# Patient Record
Sex: Male | Born: 1935 | Race: White | Hispanic: No | Marital: Married | Smoking: Former smoker
Health system: Southern US, Community
[De-identification: ages and names within clinical notes are randomized; demographics above are authoritative.]

## PROBLEM LIST (undated history)

## (undated) DIAGNOSIS — Z950 Presence of cardiac pacemaker: Secondary | ICD-10-CM

## (undated) DIAGNOSIS — M7989 Other specified soft tissue disorders: Secondary | ICD-10-CM

## (undated) DIAGNOSIS — I5020 Unspecified systolic (congestive) heart failure: Secondary | ICD-10-CM

## (undated) DIAGNOSIS — I714 Abdominal aortic aneurysm, without rupture, unspecified: Secondary | ICD-10-CM

## (undated) DIAGNOSIS — I2510 Atherosclerotic heart disease of native coronary artery without angina pectoris: Secondary | ICD-10-CM

## (undated) DIAGNOSIS — R451 Restlessness and agitation: Secondary | ICD-10-CM

## (undated) DIAGNOSIS — I219 Acute myocardial infarction, unspecified: Secondary | ICD-10-CM

## (undated) DIAGNOSIS — R0602 Shortness of breath: Secondary | ICD-10-CM

## (undated) DIAGNOSIS — M2550 Pain in unspecified joint: Secondary | ICD-10-CM

## (undated) DIAGNOSIS — Z972 Presence of dental prosthetic device (complete) (partial): Secondary | ICD-10-CM

## (undated) DIAGNOSIS — E875 Hyperkalemia: Secondary | ICD-10-CM

## (undated) DIAGNOSIS — I519 Heart disease, unspecified: Secondary | ICD-10-CM

## (undated) DIAGNOSIS — H532 Diplopia: Secondary | ICD-10-CM

## (undated) DIAGNOSIS — I10 Essential (primary) hypertension: Secondary | ICD-10-CM

## (undated) DIAGNOSIS — N289 Disorder of kidney and ureter, unspecified: Secondary | ICD-10-CM

## (undated) DIAGNOSIS — R609 Edema, unspecified: Secondary | ICD-10-CM

## (undated) DIAGNOSIS — I4510 Unspecified right bundle-branch block: Secondary | ICD-10-CM

## (undated) DIAGNOSIS — E785 Hyperlipidemia, unspecified: Secondary | ICD-10-CM

## (undated) DIAGNOSIS — R410 Disorientation, unspecified: Secondary | ICD-10-CM

## (undated) DIAGNOSIS — I639 Cerebral infarction, unspecified: Secondary | ICD-10-CM

## (undated) DIAGNOSIS — R413 Other amnesia: Secondary | ICD-10-CM

## (undated) HISTORY — DX: Abdominal aortic aneurysm, without rupture, unspecified: I71.40

## (undated) HISTORY — DX: Hyperlipidemia, unspecified: E78.5

## (undated) HISTORY — DX: Edema, unspecified: R60.9

## (undated) HISTORY — DX: Heart disease, unspecified: I51.9

## (undated) HISTORY — PX: ANEURYSM COILING: SHX5349

## (undated) HISTORY — DX: Restlessness and agitation: R45.1

## (undated) HISTORY — PX: ROTATOR CUFF REPAIR: SHX139

## (undated) HISTORY — DX: Cerebral infarction, unspecified: I63.9

## (undated) HISTORY — DX: Disorientation, unspecified: R41.0

## (undated) HISTORY — DX: Presence of cardiac pacemaker: Z95.0

## (undated) HISTORY — DX: Essential (primary) hypertension: I10

## (undated) HISTORY — PX: HERNIA REPAIR: SHX51

## (undated) HISTORY — DX: Other amnesia: R41.3

## (undated) HISTORY — DX: Diplopia: H53.2

## (undated) HISTORY — DX: Pain in unspecified joint: M25.50

## (undated) HISTORY — DX: Other specified soft tissue disorders: M79.89

## (undated) HISTORY — DX: Presence of dental prosthetic device (complete) (partial): Z97.2

## (undated) HISTORY — DX: Disorder of kidney and ureter, unspecified: N28.9

## (undated) HISTORY — DX: Unspecified right bundle-branch block: I45.10

## (undated) HISTORY — DX: Acute myocardial infarction, unspecified: I21.9

## (undated) HISTORY — DX: Atherosclerotic heart disease of native coronary artery without angina pectoris: I25.10

## (undated) HISTORY — DX: Shortness of breath: R06.02

## (undated) HISTORY — DX: Hyperkalemia: E87.5

## (undated) HISTORY — PX: LASIK: SHX215

## (undated) HISTORY — DX: Abdominal aortic aneurysm, without rupture: I71.4

---

## 1988-11-27 HISTORY — PX: CORONARY ARTERY BYPASS GRAFT: SHX141

## 2009-11-27 HISTORY — PX: OTHER SURGICAL HISTORY: SHX169

## 2010-02-22 ENCOUNTER — Encounter: Payer: Self-pay | Admitting: Internal Medicine

## 2010-03-03 ENCOUNTER — Encounter: Payer: Self-pay | Admitting: Internal Medicine

## 2010-03-08 ENCOUNTER — Encounter: Payer: Self-pay | Admitting: Internal Medicine

## 2010-03-10 ENCOUNTER — Encounter: Payer: Self-pay | Admitting: Internal Medicine

## 2010-03-24 ENCOUNTER — Ambulatory Visit: Payer: Self-pay | Admitting: Internal Medicine

## 2010-03-24 DIAGNOSIS — I219 Acute myocardial infarction, unspecified: Secondary | ICD-10-CM | POA: Insufficient documentation

## 2010-03-24 DIAGNOSIS — I714 Abdominal aortic aneurysm, without rupture, unspecified: Secondary | ICD-10-CM | POA: Insufficient documentation

## 2010-03-24 DIAGNOSIS — I5022 Chronic systolic (congestive) heart failure: Secondary | ICD-10-CM

## 2010-03-24 DIAGNOSIS — I472 Ventricular tachycardia: Secondary | ICD-10-CM

## 2010-03-24 DIAGNOSIS — I2510 Atherosclerotic heart disease of native coronary artery without angina pectoris: Secondary | ICD-10-CM | POA: Insufficient documentation

## 2010-03-24 DIAGNOSIS — I4510 Unspecified right bundle-branch block: Secondary | ICD-10-CM

## 2010-03-24 DIAGNOSIS — E119 Type 2 diabetes mellitus without complications: Secondary | ICD-10-CM | POA: Insufficient documentation

## 2010-04-05 ENCOUNTER — Inpatient Hospital Stay (HOSPITAL_COMMUNITY): Admission: RE | Admit: 2010-04-05 | Discharge: 2010-04-06 | Payer: Self-pay | Admitting: Internal Medicine

## 2010-04-05 ENCOUNTER — Ambulatory Visit: Payer: Self-pay | Admitting: Internal Medicine

## 2010-04-06 ENCOUNTER — Encounter: Payer: Self-pay | Admitting: Internal Medicine

## 2010-04-12 ENCOUNTER — Encounter: Payer: Self-pay | Admitting: Internal Medicine

## 2010-04-18 ENCOUNTER — Encounter: Payer: Self-pay | Admitting: Internal Medicine

## 2010-04-18 ENCOUNTER — Ambulatory Visit: Payer: Self-pay

## 2010-07-19 ENCOUNTER — Ambulatory Visit: Payer: Self-pay | Admitting: Internal Medicine

## 2010-07-19 DIAGNOSIS — Z95810 Presence of automatic (implantable) cardiac defibrillator: Secondary | ICD-10-CM

## 2010-09-23 ENCOUNTER — Ambulatory Visit: Payer: Self-pay | Admitting: Cardiology

## 2010-09-23 ENCOUNTER — Encounter: Payer: Self-pay | Admitting: Internal Medicine

## 2010-12-22 ENCOUNTER — Ambulatory Visit
Admission: RE | Admit: 2010-12-22 | Discharge: 2010-12-22 | Payer: Self-pay | Source: Home / Self Care | Attending: Internal Medicine | Admitting: Internal Medicine

## 2010-12-22 ENCOUNTER — Encounter: Payer: Self-pay | Admitting: Internal Medicine

## 2010-12-27 NOTE — Cardiovascular Report (Signed)
Summary: Pre Op Orders  Pre Op Orders   Imported By: Roderic Ovens 04/19/2010 15:07:38  _____________________________________________________________________  External Attachment:    Type:   Image     Comment:   External Document

## 2010-12-27 NOTE — Letter (Signed)
Summary: Implantable Device Instructions  Architectural technologist, Main Office  [address]   [place], [place] [address]   Phone: [phone]  Fax: [phone]      Implantable Device Instructions  You are scheduled for:  _____ Implantable Cardioverter Defibrillator   on 04/05/10 with Dr. Ladona Ridgel.  1.  Please arrive at the Short Stay Center at [facility] at 5:30am on the day of your procedure.  2.  Do not eat or drink after midnight  the night before your procedure.  3.  Complete lab work on 03/29/10 at Dr Rondel Baton  do not have to office Yoube fasting.  4.  Do NOT take these medications for the am of your procedure: 1/2 your Lantus dose and HOLD the Metformin  5.  Plan for an overnight stay.  Bring your insurance cards and a list of your medications.  6.  Wash your chest and neck with antibacterial soap (any brand) the evening before and the morning of your procedure.  Rinse well.  7.  Education material received:           ICD _____            *If you have ANY questions after you get home, please call the office [phone]. Jason Walker  *Every attempt is made to prevent procedures from being rescheduled.  Due to the nauture of Electrophysiology, rescheduling can happen.  The physician is always aware and directs the staff when this occurs.

## 2010-12-27 NOTE — Miscellaneous (Signed)
Summary: Device preload  Clinical Lists Changes  Observations: Added new observation of ICD INDICATN: CM (04/12/2010 14:58) Added new observation of ICDLEADSTAT2: active (04/12/2010 14:58) Added new observation of ICDLEADSER2: [id number] (04/12/2010 14:58) Added new observation of ICDLEADMOD2: 5366  (04/12/2010 14:58) Added new observation of ICDLEADLOC2: RV  (04/12/2010 14:58) Added new observation of ICDLEADSTAT1: active  (04/12/2010 14:58) Added new observation of ICDLEADSER1: [id number] a  (04/12/2010 14:58) Added new observation of ICDLEADMOD1: 5076  (04/12/2010 14:58) Added new observation of ICDLEADLOC1: RA  (04/12/2010 14:58) Added new observation of ICD IMP MD: Lewayne Bunting, MD  (04/12/2010 14:58) Added new observation of ICDLEADDOI2: 04/05/2010  (04/12/2010 14:58) Added new observation of ICDLEADDOI1: 04/05/2010  (04/12/2010 14:58) Added new observation of ICD IMPL DTE: 04/05/2010  (04/12/2010 14:58) Added new observation of ICD SERL#: [id number] H  (04/12/2010 14:58) Added new observation of ICD MODL#: D224DRG  (04/12/2010 14:58) Added new observation of ICDMANUFACTR: Medtronic  (04/12/2010 14:58) Added new observation of ICD MD: Lewayne Bunting, MD  (04/12/2010 14:58)       ICD Specifications Following MD:  Lewayne Bunting, MD     ICD Vendor:  [employer]tronic     ICD Model Number:  D224DRG     ICD Serial Number:  [id number] ICD DOI:  04/05/2010     ICD Implanting MD:  Lewayne Bunting, MD  Lead 1:    Location: RA     DOI: 04/05/2010     Model #: 0630     Serial #: [id number]     Status: active Lead 2:    Location: RV     DOI: 04/05/2010     Model #: 5732     Serial #: [id number]     Status: active  Indications::  CM

## 2010-12-27 NOTE — Procedures (Signed)
Summary: icd wound check   Current Medications (verified): 1)  Metoprolol Tartrate 100 Mg Tabs (Metoprolol Tartrate) .... Take One-Half  Tablet By Mouth Twice A Day 2)  Lantus 100 Unit/ml Soln (Insulin Glargine) .... Uad 3)  Aspirin Ec 325 Mg Tbec (Aspirin) .... Take One Tablet By Mouth Daily 4)  Metformin Hcl 1000 Mg Tabs (Metformin Hcl) .... Take One Tablet By Mouth Twice Daily. 5)  Lisinopril 40 Mg Tabs (Lisinopril) .... Take One Tablet By Mouth Daily 6)  Simvastatin 40 Mg Tabs (Simvastatin) .... Take One Tablet By Mouth Daily At Bedtime 7)  Nitrostat 0.4 Mg Subl (Nitroglycerin) .Marland Kitchen.. 1 Tablet Under Tongue At Onset of Chest Pain; You May Repeat Every 5 Minutes For Up To 3 Doses. 8)  Amlodipine Besylate 5 Mg Tabs (Amlodipine Besylate) .... Take One Tablet By Mouth Daily 9)  Benazepril Hcl 20 Mg Tabs (Benazepril Hcl) .... Take One Tablet By Mouth Once Daily. 10)  Doxazosin Mesylate 4 Mg Tabs (Doxazosin Mesylate) .... Take One Tablet By Mouth Once Daily. 11)  Apap 325 Mg Tabs (Acetaminophen) .... Uad 12)  Hydrocodone-Acetaminophen 5-325 Mg Tabs (Hydrocodone-Acetaminophen) .... Uad  Allergies (verified): No Known Drug Allergies   ICD Specifications Following MD:  Lewayne Bunting, MD     ICD Vendor:  Medtronic     ICD Model Number:  D224DRG     ICD Serial Number:  [id number] ICD DOI:  04/05/2010     ICD Implanting MD:  Lewayne Bunting, MD  Lead 1:    Location: RA     DOI: 04/05/2010     Model #: 0981     Serial #: [id number]     Status: active Lead 2:    Location: RV     DOI: 04/05/2010     Model #: 2130     Serial #: [id number]     Status: active  Indications::  CM   ICD Follow Up Remote Check?  No Battery Voltage:  3.21 V     Charge Time:  8.4 seconds     Underlying rhythm:  Huston Foley ICD Dependent:  No       ICD Device Measurements Atrium:  Amplitude: 3.5 mV, Impedance: 532 ohms, Threshold: 0.5 V at 0.4 msec Right Ventricle:  Amplitude: 3.5 mV, Impedance: 494 ohms, Threshold: 0.625 V at  0.4 msec Shock Impedance: 41/53 ohms   Episodes MS Episodes:  2     Percent Mode Switch:  <0.1%     Coumadin:  No Shock:  0     ATP:  0     Nonsustained:  0     Atrial Pacing:  83.8%     Ventricular Pacing:  0.2%  Brady Parameters Mode MVP (R)     Lower Rate Limit:  60     Upper Rate Limit 120 PAV 180     Sensed AV Delay:  150  Tachy Zones VF:  222     VT:  176     Next Remote Date:  12/22/2010     Next Cardiology Appt Due:  03/28/2011 Tech Comments:  Mr. Mcleish was seen today in [facility] at his request for concerns of a small lump above the incision site.  He was actually feeling the lead into the header of the device. R-wave chronic @ 3.63mv.   There were 2  mode switch episodes noted the longest 44 minutes, - coumadin.  Optivol and thoracic impedance normal.  Carelink transmissions every 3 months. ROV 5/12 with Dr. Ladona Ridgel in [facility].  Altha Harm, LPN  September 23, 2010 2:39 PM

## 2010-12-27 NOTE — Cardiovascular Report (Signed)
Summary: Office Visit   Office Visit   Imported By: Roderic Ovens 09/30/2010 09:50:51  _____________________________________________________________________  External Attachment:    Type:   Image     Comment:   External Document

## 2010-12-27 NOTE — Cardiovascular Report (Signed)
Summary: Office Visit   Office Visit   Imported By: Roderic Ovens 07/22/2010 10:22:00  _____________________________________________________________________  External Attachment:    Type:   Image     Comment:   External Document

## 2010-12-27 NOTE — Letter (Signed)
Summary: [facility]   [facility] Note   Imported By: Roderic Ovens 04/19/2010 15:09:49  _____________________________________________________________________  External Attachment:    Type:   Image     Comment:   External Document

## 2010-12-27 NOTE — Letter (Signed)
Summary: [facility] Note   [facility] Note   Imported By: Roderic Ovens 04/19/2010 15:09:15  _____________________________________________________________________  External Attachment:    Type:   Image     Comment:   External Document

## 2010-12-27 NOTE — Assessment & Plan Note (Signed)
Summary: NEP/VTACHY/BRADYCARDIA W/ BETABLOCKER/JML   Visit Type:  Initial Consult   History of Present Illness: Jason Walker is referred today by Dr. Hyacinth Meeker for consideration for ICD implant.  He has a long standing h/o CAD and is s/p MI and CABG.  He also has a h/o AAA.  The patient has never had syncope but he does have documented bradycardia associated with frequent PVC's in a Bigeminal fashion.  A recent 24 hour monitor demonstrated about a third of his heart beats were PVC's.  Also, he had NSVT on cardiac monitor.  He notes that some days he feels fatigued and tired and has difficulty with activities of daily living.  At other times he feels well.  He denies peripheral edema.  A stress myoview demonstrated a fixed inferior defect and an EF of 34% with no ischemia.                           Current Medications (verified): 1)  Metoprolol Tartrate 100 Mg Tabs (Metoprolol Tartrate) .... Take One-Half  Tablet By Mouth Twice A Day 2)  Lantus 100 Unit/ml Soln (Insulin Glargine) .... Uad 3)  Aspirin Ec 325 Mg Tbec (Aspirin) .... Take One Tablet By Mouth Daily 4)  Metformin Hcl 1000 Mg Tabs (Metformin Hcl) .... Take One Tablet By Mouth Twice Daily. 5)  Lisinopril 40 Mg Tabs (Lisinopril) .... Take One Tablet By Mouth Daily 6)  Simvastatin 40 Mg Tabs (Simvastatin) .... Take One Tablet By Mouth Daily At Bedtime 7)  Nitrostat 0.4 Mg Subl (Nitroglycerin) .Marland Kitchen.. 1 Tablet Under Tongue At Onset of Chest Pain; You May Repeat Every 5 Minutes For Up To 3 Doses. 8)  Amlodipine Besylate 5 Mg Tabs (Amlodipine Besylate) .... Take One Tablet By Mouth Daily 9)  Benazepril Hcl 20 Mg Tabs (Benazepril Hcl) .... Take One Tablet By Mouth Once Daily. 10)  Doxazosin Mesylate 4 Mg Tabs (Doxazosin Mesylate) .... Take One Tablet By Mouth Once Daily.  Allergies (verified): No Known Drug Allergies  Past History:  Past Medical History: Last updated: 03/23/2010 Current Problems:  RBBB (ICD-426.4) AAA (ICD-441.4) AMI  (ICD-410.90) DM (ICD-250.00) CAD (ICD-414.00)    Past Surgical History: CABG 1990  Review of Systems       All systems reviewed and negative excpt as noted in the HPI.  Vital Signs:  Patient profile:   77 year old male Height:      67 inches Weight:      194 pounds BMI:     30.49 Pulse rate:   57 / minute BP sitting:   174 / 74  (left arm)  Vitals Entered By: Laurance Flatten CMA (March 24, 2010 10:52 AM)  Physical Exam  General:  Well developed, well nourished, in no acute distress.  HEENT: normal Neck: supple. No JVD. Carotids 2+ bilaterally no bruits Cor: RRR no rubs, gallops or murmur. Well healed median sternotomy. Lungs: CTA Ab: soft, nontender. nondistended. No HSM. Good bowel sounds Ext: warm. no cyanosis, clubbing or edema Neuro: alert and oriented. Grossly nonfocal. affect pleasant    EKG  Procedure date:  03/24/2010  Findings:      Normal sinus rhythm with rate of:  60.  Comments:      Evidence of remote inferior MI.    Impression & Recommendations:  Problem # 1:  CAD (ICD-414.00) He currently is free of angina.  I have recommended that he continue his current meds. Her updated medication list for this problem  includes:    Metoprolol Tartrate 100 Mg Tabs (Metoprolol tartrate) .Marland Kitchen... Take one-half  tablet by mouth twice a day    Aspirin Ec 325 Mg Tbec (Aspirin) .Marland Kitchen... Take one tablet by mouth daily    Lisinopril 40 Mg Tabs (Lisinopril) .Marland Kitchen... Take one tablet by mouth daily    Nitrostat 0.4 Mg Subl (Nitroglycerin) .Marland Kitchen... 1 tablet under tongue at onset of chest pain; you may repeat every 5 minutes for up to 3 doses.    Amlodipine Besylate 5 Mg Tabs (Amlodipine besylate) .Marland Kitchen... Take one tablet by mouth daily    Benazepril Hcl 20 Mg Tabs (Benazepril hcl) .Marland Kitchen... Take one tablet by mouth once daily.  Problem # 2:  VENTRICULAR TACHYCARDIA, PAROXYSMAL (ICD-427.1) His VT is minimally symptomatic.  Continue current meds.  I will hold of on amiodarone for now but  would recommend ICD implant. Her updated medication list for this problem includes:    Metoprolol Tartrate 100 Mg Tabs (Metoprolol tartrate) .Marland Kitchen... Take one-half  tablet by mouth twice a day    Aspirin Ec 325 Mg Tbec (Aspirin) .Marland Kitchen... Take one tablet by mouth daily    Lisinopril 40 Mg Tabs (Lisinopril) .Marland Kitchen... Take one tablet by mouth daily    Nitrostat 0.4 Mg Subl (Nitroglycerin) .Marland Kitchen... 1 tablet under tongue at onset of chest pain; you may repeat every 5 minutes for up to 3 doses.    Amlodipine Besylate 5 Mg Tabs (Amlodipine besylate) .Marland Kitchen... Take one tablet by mouth daily    Benazepril Hcl 20 Mg Tabs (Benazepril hcl) .Marland Kitchen... Take one tablet by mouth once daily.  Problem # 3:  CHRONIC SYSTOLIC HEART FAILURE (ICD-428.22) He is currently class 2.  I have discussed the risks/benefits/goals/expectations of ICD implant with the patient and he wishes to proceed. Her updated medication list for this problem includes:    Metoprolol Tartrate 100 Mg Tabs (Metoprolol tartrate) .Marland Kitchen... Take one-half  tablet by mouth twice a day    Aspirin Ec 325 Mg Tbec (Aspirin) .Marland Kitchen... Take one tablet by mouth daily    Lisinopril 40 Mg Tabs (Lisinopril) .Marland Kitchen... Take one tablet by mouth daily    Nitrostat 0.4 Mg Subl (Nitroglycerin) .Marland Kitchen... 1 tablet under tongue at onset of chest pain; you may repeat every 5 minutes for up to 3 doses.    Amlodipine Besylate 5 Mg Tabs (Amlodipine besylate) .Marland Kitchen... Take one tablet by mouth daily    Benazepril Hcl 20 Mg Tabs (Benazepril hcl) .Marland Kitchen... Take one tablet by mouth once daily.  Patient Instructions: 1)  Your physician has recommended that you have a defibrillator inserted.  An implantable cardioverter defibrillator (ICD) is a small device that is placed in your chest or, in rare cases, your abdomen. This device uses electrical pulses or shocks to help control life-threatening, irregular heartbeats that could lead the heart to suddenly stop beating (sudden cardiac arrest). Leads are attached to the ICD  that goes into your heart. This is done in the hospital and usually requires an overnight stay. Please see the instruction sheet given to you today for more information.

## 2010-12-27 NOTE — Cardiovascular Report (Signed)
Summary: Office Visit   Office Visit   Imported By: Roderic Ovens 05/13/2010 11:43:06  _____________________________________________________________________  External Attachment:    Type:   Image     Comment:   External Document

## 2010-12-27 NOTE — Cardiovascular Report (Signed)
Summary: Device Implant Record   Device Implant Record   Imported By: Roderic Ovens 05/13/2010 11:45:47  _____________________________________________________________________  External Attachment:    Type:   Image     Comment:   External Document

## 2010-12-27 NOTE — Procedures (Signed)
Summary: wch. gd   Current Medications (verified): 1)  Metoprolol Tartrate 100 Mg Tabs (Metoprolol Tartrate) .... Take One-Half  Tablet By Mouth Twice A Day 2)  Lantus 100 Unit/ml Soln (Insulin Glargine) .... Uad 3)  Aspirin Ec 325 Mg Tbec (Aspirin) .... Take One Tablet By Mouth Daily 4)  Metformin Hcl 1000 Mg Tabs (Metformin Hcl) .... Take One Tablet By Mouth Twice Daily. 5)  Lisinopril 40 Mg Tabs (Lisinopril) .... Take One Tablet By Mouth Daily 6)  Simvastatin 40 Mg Tabs (Simvastatin) .... Take One Tablet By Mouth Daily At Bedtime 7)  Nitrostat 0.4 Mg Subl (Nitroglycerin) .Marland Kitchen.. 1 Tablet Under Tongue At Onset of Chest Pain; You May Repeat Every 5 Minutes For Up To 3 Doses. 8)  Amlodipine Besylate 5 Mg Tabs (Amlodipine Besylate) .... Take One Tablet By Mouth Daily 9)  Benazepril Hcl 20 Mg Tabs (Benazepril Hcl) .... Take One Tablet By Mouth Once Daily. 10)  Doxazosin Mesylate 4 Mg Tabs (Doxazosin Mesylate) .... Take One Tablet By Mouth Once Daily.  Allergies (verified): No Known Drug Allergies   ICD Specifications Following MD:  Lewayne Bunting, MD     ICD Vendor:  [employer]tronic     ICD Model Number:  D224DRG     ICD Serial Number:  [id number] ICD DOI:  04/05/2010     ICD Implanting MD:  Lewayne Bunting, MD  Lead 1:    Location: RA     DOI: 04/05/2010     Model #: 7425     Serial #: [id number]     Status: active Lead 2:    Location: RV     DOI: 04/05/2010     Model #: 3295     Serial #: [id number]     Status: active  Indications::  CM   ICD Follow Up Remote Check?  No Battery Voltage:  3.22 V     Charge Time:  8.4 seconds     Underlying rhythm:  SB ICD Dependent:  No       ICD Device Measurements Atrium:  Amplitude: 3.6 mV, Impedance: 532 ohms, Threshold: 0.5 V at 0.4 msec Right Ventricle:  Amplitude: 10 mV, Impedance: 437 ohms, Threshold: 0.5 V at 0.4 msec Shock Impedance: 38/47 ohms   Episodes MS Episodes:  0     Shock:  0     ATP:  0     Nonsustained:  0     Atrial Pacing:  73.4%      Ventricular Pacing:  0.4%  Brady Parameters Mode MVP (R)     Lower Rate Limit:  60     Upper Rate Limit 120 PAV 180     Sensed AV Delay:  150  Tachy Zones VF:  222     VT:  176     Next Cardiology Appt Due:  06/27/2010 Tech Comments:  Normal device function.  R waves 2.46mV with automatic measurements, 10mV on paper.  Wound check appt.  Steri-strips removed.  Wound without redness or edema.  Rate response turned on today because of high percentage of atrial pacing.  No other changes made.  ROV 3 months GT.  Will order Carelink box because patient lives in [place].  Reviewd wound care, shock, and EMI instructions. Gypsy Balsam RN BSN  Apr 18, 2010 2:03 PM  MD Comments:  Agree with above.

## 2010-12-27 NOTE — Assessment & Plan Note (Signed)
Summary: pc2/ gd   Visit Type:  Follow-up Primary Provider:  Dorna Leitz, MD   History of Present Illness: Mr. Main returns  today for ICD.  He has a long standing h/o CAD and is s/p MI and CABG.  He also has a h/o AAA.  The patient has never had syncope but he does have documented bradycardia associated with frequent PVC's in a Bigeminal fashion.  A recent 24 hour monitor demonstrated about a third of his heart beats were PVC's. He underwent DDD ICD implant several months ago.  He is improved.  His dyspnea and weakness are better though still not totally resolved.  He denies any intercurrent ICD shocks.  Current Medications (verified): 1)  Metoprolol Tartrate 100 Mg Tabs (Metoprolol Tartrate) .... Take One-Half  Tablet By Mouth Twice A Day 2)  Lantus 100 Unit/ml Soln (Insulin Glargine) .... Uad 3)  Aspirin Ec 325 Mg Tbec (Aspirin) .... Take One Tablet By Mouth Daily 4)  Metformin Hcl 1000 Mg Tabs (Metformin Hcl) .... Take One Tablet By Mouth Twice Daily. 5)  Lisinopril 40 Mg Tabs (Lisinopril) .... Take One Tablet By Mouth Daily 6)  Simvastatin 40 Mg Tabs (Simvastatin) .... Take One Tablet By Mouth Daily At Bedtime 7)  Nitrostat 0.4 Mg Subl (Nitroglycerin) .Marland Kitchen.. 1 Tablet Under Tongue At Onset of Chest Pain; You May Repeat Every 5 Minutes For Up To 3 Doses. 8)  Amlodipine Besylate 5 Mg Tabs (Amlodipine Besylate) .... Take One Tablet By Mouth Daily 9)  Benazepril Hcl 20 Mg Tabs (Benazepril Hcl) .... Take One Tablet By Mouth Once Daily. 10)  Doxazosin Mesylate 4 Mg Tabs (Doxazosin Mesylate) .... Take One Tablet By Mouth Once Daily. 11)  Apap 325 Mg Tabs (Acetaminophen) .... Uad 12)  Hydrocodone-Acetaminophen 5-325 Mg Tabs (Hydrocodone-Acetaminophen) .... Uad  Allergies (verified): No Known Drug Allergies  Past History:  Past Medical History: Last updated: 03/23/2010 Current Problems:  RBBB (ICD-426.4) AAA (ICD-441.4) AMI (ICD-410.90) DM (ICD-250.00) CAD (ICD-414.00)    Past Surgical  History: CABG 1990 s/p ICD 2011.  Review of Systems  The patient denies chest pain, syncope, dyspnea on exertion, and peripheral edema.    Vital Signs:  Patient profile:   77 year old male Height:      67 inches Weight:      194 pounds BMI:     30.49 Pulse rate:   64 / minute BP sitting:   177 / 77  (left arm)  Vitals Entered By: Laurance Flatten CMA (July 19, 2010 2:18 PM)  Physical Exam  General:  Well developed, well nourished, in no acute distress.  HEENT: normal Neck: supple. No JVD. Carotids 2+ bilaterally no bruits Cor: RRR no rubs, gallops or murmur. Well healed median sternotomy. Lungs: CTA. Well healed ICD incision. Ab: soft, nontender. nondistended. No HSM. Good bowel sounds Ext: warm. no cyanosis, clubbing or edema Neuro: alert and oriented. Grossly nonfocal. affect pleasant     ICD Specifications Following MD:  Lewayne Bunting, MD     ICD Vendor:  Medtronic     ICD Model Number:  D224DRG     ICD Serial Number:  [id number] ICD DOI:  04/05/2010     ICD Implanting MD:  Lewayne Bunting, MD  Lead 1:    Location: RA     DOI: 04/05/2010     Model #: 0981     Serial #: [id number]     Status: active Lead 2:    Location: RV     DOI: 04/05/2010  Model #: [id number]     Serial #: [id number]     Status: active  Indications::  CM   ICD Follow Up Remote Check?  No Battery Voltage:  3.21 V     Charge Time:  8.4 seconds     Underlying rhythm:  SR ICD Dependent:  No       ICD Device Measurements Atrium:  Amplitude: 4.4 mV, Impedance: 532 ohms, Threshold: 0.5 V at 0.4 msec Right Ventricle:  Amplitude: 4.1 mV, Impedance: 475 ohms, Threshold: 0.5 V at 0.4 msec Shock Impedance: 44/57 ohms   Episodes MS Episodes:  0     Percent Mode Switch:  0     Coumadin:  No Shock:  0     ATP:  0     Nonsustained:  0     Atrial Pacing:  88.0%     Ventricular Pacing:  0.2%  Brady Parameters Mode MVP (R)     Lower Rate Limit:  60     Upper Rate Limit 120 PAV 180     Sensed AV Delay:   150  Tachy Zones VF:  222     VT:  176     Next Remote Date:  10/20/2010     Next Cardiology Appt Due:  04/04/2011 Tech Comments:  Outputs reprogrammed for chronic thresholds.  Lead impedance alert values reprogrammed.  Optivol and thoracic impedance normal.  Carelink transmissions every 3 months.  ROV 1 year with Dr. Ladona Ridgel. Altha Harm, LPN  July 19, 2010 2:36 PM  MD Comments:  Agree with above.  Impression & Recommendations:  Problem # 1:  AUTOMATIC IMPLANTABLE CARDIAC DEFIBRILLATOR SITU (ICD-V45.02) His device is working normally.  Will recheck in several months.  Problem # 2:  CHRONIC SYSTOLIC HEART FAILURE (ICD-428.22) His symptoms appear to be class 2.  He will continue meds as below. His updated medication list for this problem includes:    Metoprolol Tartrate 100 Mg Tabs (Metoprolol tartrate) .Marland Kitchen... Take one-half  tablet by mouth twice a day    Aspirin Ec 325 Mg Tbec (Aspirin) .Marland Kitchen... Take one tablet by mouth daily    Lisinopril 40 Mg Tabs (Lisinopril) .Marland Kitchen... Take one tablet by mouth daily    Nitrostat 0.4 Mg Subl (Nitroglycerin) .Marland Kitchen... 1 tablet under tongue at onset of chest pain; you may repeat every 5 minutes for up to 3 doses.    Amlodipine Besylate 5 Mg Tabs (Amlodipine besylate) .Marland Kitchen... Take one tablet by mouth daily    Benazepril Hcl 20 Mg Tabs (Benazepril hcl) .Marland Kitchen... Take one tablet by mouth once daily.  Problem # 3:  VENTRICULAR TACHYCARDIA, PAROXYSMAL (ICD-427.1) HIs NSVT and PVC's are improved with backup pacing.  Continue meds below.  I would not recommend amiodarone at this time. His updated medication list for this problem includes:    Metoprolol Tartrate 100 Mg Tabs (Metoprolol tartrate) .Marland Kitchen... Take one-half  tablet by mouth twice a day    Aspirin Ec 325 Mg Tbec (Aspirin) .Marland Kitchen... Take one tablet by mouth daily    Lisinopril 40 Mg Tabs (Lisinopril) .Marland Kitchen... Take one tablet by mouth daily    Nitrostat 0.4 Mg Subl (Nitroglycerin) .Marland Kitchen... 1 tablet under tongue at onset of chest  pain; you may repeat every 5 minutes for up to 3 doses.    Amlodipine Besylate 5 Mg Tabs (Amlodipine besylate) .Marland Kitchen... Take one tablet by mouth daily    Benazepril Hcl 20 Mg Tabs (Benazepril hcl) .Marland Kitchen... Take one tablet by mouth once daily.  Patient Instructions:  1)  Your physician recommends that you schedule a follow-up appointment in: in [place] in may 2012

## 2011-01-16 ENCOUNTER — Encounter (INDEPENDENT_AMBULATORY_CARE_PROVIDER_SITE_OTHER): Payer: Self-pay | Admitting: *Deleted

## 2011-01-24 NOTE — Letter (Signed)
Summary: Remote Device Check  [facility], Main Office  [address]   [place], [place] [address]   Phone: [phone]  Fax: [phone]     January 16, 2011 MRN: [id number]   Jason Walker [address] [place], [place]  [address]   Dear [name],   Your remote transmission was recieved and reviewed by your physician.  All diagnostics were within normal limits for you.  __X____Your next office visit is scheduled for:  May 2012 with Dr Ladona Ridgel. Please call our office to schedule an appointment.    Sincerely,  Vella Kohler

## 2011-01-24 NOTE — Cardiovascular Report (Signed)
Summary: Office Visit Remote   Office Visit Remote   Imported By: Roderic Ovens 01/20/2011 09:15:28  _____________________________________________________________________  External Attachment:    Type:   Image     Comment:   External Document

## 2011-02-14 LAB — BASIC METABOLIC PANEL
BUN: 34 mg/dL — ABNORMAL HIGH (ref 6–23)
Calcium: 8.8 mg/dL (ref 8.4–10.5)
Chloride: 103 mEq/L (ref 96–112)
GFR calc Af Amer: 29 mL/min — ABNORMAL LOW (ref >60)
Potassium: 4.1 mEq/L (ref 3.5–5.1)

## 2011-02-14 LAB — SURGICAL PCR SCREEN: Staphylococcus aureus: NEGATIVE

## 2011-02-14 LAB — GLUCOSE, CAPILLARY
Glucose-Capillary: 138 mg/dL — ABNORMAL HIGH (ref 70–99)
Glucose-Capillary: 148 mg/dL — ABNORMAL HIGH (ref 70–99)
Glucose-Capillary: 172 mg/dL — ABNORMAL HIGH (ref 70–99)
Glucose-Capillary: 177 mg/dL — ABNORMAL HIGH (ref 70–99)
Glucose-Capillary: 196 mg/dL — ABNORMAL HIGH (ref 70–99)

## 2011-02-14 LAB — CBC: RDW: 16 % — ABNORMAL HIGH (ref 11.5–15.5)

## 2011-02-14 LAB — PROTIME-INR: Prothrombin Time: 13.6 seconds (ref 11.6–15.2)

## 2011-04-03 ENCOUNTER — Encounter: Payer: Self-pay | Admitting: *Deleted

## 2011-04-05 ENCOUNTER — Encounter: Payer: Self-pay | Admitting: *Deleted

## 2011-04-05 ENCOUNTER — Ambulatory Visit (INDEPENDENT_AMBULATORY_CARE_PROVIDER_SITE_OTHER): Payer: PRIVATE HEALTH INSURANCE | Admitting: *Deleted

## 2011-04-05 DIAGNOSIS — I472 Ventricular tachycardia, unspecified: Secondary | ICD-10-CM

## 2011-04-05 DIAGNOSIS — I5022 Chronic systolic (congestive) heart failure: Secondary | ICD-10-CM

## 2011-04-12 ENCOUNTER — Telehealth: Payer: Self-pay | Admitting: Internal Medicine

## 2011-04-12 NOTE — Telephone Encounter (Signed)
Patient's wife wanted to know results of the device check. Spoke with Gunnar Fusi and he had 6 episodes of atrial fibrillation with the longest lasting 3 hours. He does have a history of a-fib and is on Coumadin. Will route to Dr. Ladona Ridgel.

## 2011-04-12 NOTE — Telephone Encounter (Signed)
Had device check last week and the tech said some things that she is concerned about.  Would like to speak to you in reference to same.  Tech was to speak to Dr. Ladona Ridgel this week, but they have not heard anything back.  Please call and advise.

## 2011-06-29 ENCOUNTER — Encounter: Payer: Self-pay | Admitting: Internal Medicine

## 2011-07-14 ENCOUNTER — Ambulatory Visit (INDEPENDENT_AMBULATORY_CARE_PROVIDER_SITE_OTHER): Payer: PRIVATE HEALTH INSURANCE | Admitting: General Surgery

## 2011-07-14 ENCOUNTER — Encounter (INDEPENDENT_AMBULATORY_CARE_PROVIDER_SITE_OTHER): Payer: Self-pay | Admitting: General Surgery

## 2011-07-14 VITALS — BP 164/84 | HR 84 | Temp 97.8°F | Ht 65.0 in | Wt 190.8 lb

## 2011-07-14 DIAGNOSIS — E109 Type 1 diabetes mellitus without complications: Secondary | ICD-10-CM

## 2011-07-14 DIAGNOSIS — N189 Chronic kidney disease, unspecified: Secondary | ICD-10-CM

## 2011-07-14 NOTE — Patient Instructions (Signed)
Please see to home training nurse next week for further decision about performing CAPD in the future you have a number of the vascular and vein specialist caring for his peripheral vascular access and call for appointment as noted physician is in the office this afternoon who does that type of surgery I can place yourr CAPD catheter in the future when you needed if you desire that it be placed here but one of your local surgeons according to the home training nurse usually that surgery is done in [place]

## 2011-07-18 ENCOUNTER — Encounter (INDEPENDENT_AMBULATORY_CARE_PROVIDER_SITE_OTHER): Payer: Self-pay | Admitting: General Surgery

## 2011-07-18 NOTE — Progress Notes (Signed)
Subjective:     Patient ID: Jason Walker, male   DOB: 07-29-1930, 77 y.o.   MRN: [id number]  HPITed ChiltonIs a 77 year old Caucasian male diabetic of long-standing insulin-dependent who states he is having problems with fluid retention and will need to be on dialysis some time in the future he lives [place] and I have records sent from [facility] office and the records state scheduled at the same for a neovascular creation and the patient states this is a visit for a fistula and that the position has not to do when he needs to start on dialysis but he wants to have a arteriovenous fistula or graft performed first the patient states that they've tried just in his medication and feel that when they diurese and his kidney function deteriorate and that the patient has reviewed PDS and says that he would like to do CAPD and not hemodialysis. His physician has not given him any type of a tentative date that he will need to start on dialysis and he has not seen anyone from Home Training and in the [place] area. The patient states that his nephrologist usually has his surgical procedures performed at [facility] but that he had had his cardiac surgery at [facility] 20 years ago Dr. Alla German was his cardiac surgeon and since he's now practicing at [facility] that's the reason he wanted to have his surgery performed at [facility] hospital.  I tried to call his nephrologist and could not reach and since he was off that day but they did give me the name of the home training unit and talk to one of the nurses that doesn't home training and they said they would see the patient early next week normally the CAPD catheters are placed and [place] 2 local surgeons that the patient's wife says that she was not comfortable with the surgeon's practicing there and [place]. I tried to talk to the patient placement of the catheter as only a small portion of the management of renal failure with CAPD and I had no work with him he is a  nephrologist in the [place] area.    Review of Systems     Objective:   Physical Exam     Assessment:    I talked personally with the patient's nephrologist in [place] and gave the patient's the telephone number of our local peripheral vascular surgeons if he needs an access for. With the patient lives in an dental I. would recommend that one of her local general surgeons place a CAPD catheter has to followup will be much better and they are familiar with the CAPD clinic there. His first step will be to create an avascular graft AV fistula hopefully for hemodialysis when it's dated and we do not do that type of surgery    Plan:

## 2011-07-19 ENCOUNTER — Encounter: Payer: Self-pay | Admitting: Internal Medicine

## 2011-07-19 ENCOUNTER — Ambulatory Visit (INDEPENDENT_AMBULATORY_CARE_PROVIDER_SITE_OTHER): Payer: PRIVATE HEALTH INSURANCE | Admitting: Internal Medicine

## 2011-07-19 DIAGNOSIS — I472 Ventricular tachycardia, unspecified: Secondary | ICD-10-CM

## 2011-07-19 DIAGNOSIS — Z95810 Presence of automatic (implantable) cardiac defibrillator: Secondary | ICD-10-CM

## 2011-07-19 DIAGNOSIS — I5022 Chronic systolic (congestive) heart failure: Secondary | ICD-10-CM

## 2011-07-19 NOTE — Assessment & Plan Note (Signed)
His symptoms and optivol are well controlled. He will continue his low sodium diet.

## 2011-07-19 NOTE — Patient Instructions (Signed)
Your physician recommends that you schedule a follow-up appointment in:1 yr Dr. Kennon Rounds 10/20/11

## 2011-07-19 NOTE — Assessment & Plan Note (Signed)
His device is working normally. Will recheck in several months. 

## 2011-07-19 NOTE — Assessment & Plan Note (Signed)
He has had no sustained VT. He will continue his current meds. 

## 2011-07-19 NOTE — Progress Notes (Signed)
HPI Jason Walker returns today for followup. He is a pleasant 77 yo man with a h/o ICM, CHF, s/p ICD implant. He has chronic renal insufficiency and may need dialysis. No Known Allergies   Current Outpatient Prescriptions  Medication Sig Dispense Refill  . amLODipine (NORVASC) 5 MG tablet Take 5 mg by mouth daily.        Marland Kitchen aspirin 325 MG tablet Take 325 mg by mouth daily.        . benazepril (LOTENSIN) 20 MG tablet Take 20 mg by mouth daily.        Marland Kitchen doxazosin (CARDURA) 4 MG tablet Take 4 mg by mouth at bedtime.        . felodipine (PLENDIL) 5 MG 24 hr tablet Take 5 mg by mouth daily.        . furosemide (LASIX) 20 MG tablet Take 20 mg by mouth daily.        Marland Kitchen HYDROcodone-acetaminophen (NORCO) 5-325 MG per tablet Take by mouth as directed.        . insulin glargine (LANTUS) 100 UNIT/ML injection Inject 35 Units into the skin at bedtime.        Marland Kitchen lisinopril (PRINIVIL,ZESTRIL) 40 MG tablet Take 40 mg by mouth daily.        . metFORMIN (GLUCOPHAGE) 1000 MG tablet Take 1,000 mg by mouth 2 (two) times daily.        . metoprolol (LOPRESSOR) 100 MG tablet Take 100 mg by mouth 2 (two) times daily.        . nitroGLYCERIN (NITROSTAT) 0.4 MG SL tablet Place 0.4 mg under the tongue every 5 (five) minutes as needed.        . ranolazine (RANEXA) 500 MG 12 hr tablet Take 500 mg by mouth 2 (two) times daily.        . simvastatin (ZOCOR) 40 MG tablet Take 40 mg by mouth at bedtime.           Past Medical History  Diagnosis Date  . RBBB (right bundle branch block)   . AAA (abdominal aortic aneurysm)   . AMI (acute myocardial infarction)   . Diabetes mellitus   . Coronary artery disease   . Pacemaker   . Defibrillator activation   . Hypertension   . Heart disease   . Heart attack   . Wears dentures   . Hyperlipidemia   . Shortness of breath   . Kidney disease     ROS:   All systems reviewed and negative except as noted in the HPI.   Past Surgical History  Procedure Date  . Coronary  artery bypass graft 1990  . Icd 2011  . Hernia repair     umbilical and RIH  . Aneurysm coiling   . Rotator cuff repair     bilateral     Family History  Problem Relation Age of Onset  . Heart disease Mother   . Diabetes Mother   . Cancer Brother     colon  . Heart disease Brother     heart attack     History   Social History  . Marital Status: Married    Spouse Name: N/A    Number of Children: N/A  . Years of Education: N/A   Occupational History  . Not on file.   Social History Main Topics  . Smoking status: Former Games developer  . Smokeless tobacco: Not on file  . Alcohol Use: Yes  . Drug Use: No  . Sexually  Active:    Other Topics Concern  . Not on file   Social History Narrative  . No narrative on file     BP 136/64  Pulse 77  Resp 18  Ht 5\' 6"  (1.676 m)  Wt 191 lb 1.9 oz (86.691 kg)  BMI 30.85 kg/m2  SpO2 97%  Physical Exam:  Well appearing NAD HEENT: Unremarkable Neck:  No JVD, no thyromegally Lymphatics:  No adenopathy Back:  No CVA tenderness Lungs:  Clear. Well healed ICD incision. HEART:  Regular rate rhythm, no murmurs, no rubs, no clicks Abd:  soft, positive bowel sounds, no organomegally, no rebound, no guarding Ext:  2 plus pulses, no edema, no cyanosis, no clubbing Skin:  No rashes no nodules Neuro:  CN II through XII intact, motor grossly intact  DEVICE  Normal device function.  See PaceArt for details.   Assess/Plan:

## 2011-08-10 ENCOUNTER — Encounter: Payer: Self-pay | Admitting: Vascular Surgery

## 2011-08-11 ENCOUNTER — Ambulatory Visit (INDEPENDENT_AMBULATORY_CARE_PROVIDER_SITE_OTHER): Payer: PRIVATE HEALTH INSURANCE | Admitting: Vascular Surgery

## 2011-08-11 ENCOUNTER — Encounter: Payer: Self-pay | Admitting: Vascular Surgery

## 2011-08-11 VITALS — BP 172/70 | HR 86 | Resp 18 | Ht 66.0 in | Wt 190.0 lb

## 2011-08-11 DIAGNOSIS — Z01810 Encounter for preprocedural cardiovascular examination: Secondary | ICD-10-CM

## 2011-08-11 DIAGNOSIS — N186 End stage renal disease: Secondary | ICD-10-CM

## 2011-08-11 DIAGNOSIS — N183 Chronic kidney disease, stage 3 unspecified: Secondary | ICD-10-CM

## 2011-08-11 NOTE — Progress Notes (Signed)
[facility]  Referred by: Dr. Demaris Callander  Reason for referral: New access  History of Present Illness  Jason Walker is a 77 y.o. male who presents for evaluation for permanent access.  The patient is right hand dominant.  The patient has not had previous access procedures.  Previous central venous cannulation procedures include: none.  The pt is unclear of etiology of his ESRD.  He also has a L PPM.  He is considering peritoneal dialysis as his primary modality.  It is unclear if there was some discussion of have a fistula placed as a backup option.  Past Medical History  Diagnosis Date  . RBBB (right bundle branch block)   . AAA (abdominal aortic aneurysm)   . AMI (acute myocardial infarction)   . Diabetes mellitus   . Coronary artery disease   . Pacemaker   . Defibrillator activation   . Hypertension   . Heart disease   . Heart attack   . Wears dentures   . Hyperlipidemia   . Shortness of breath   . Kidney disease   . Hyperpotassemia   . Edema     Past Surgical History  Procedure Date  . Coronary artery bypass graft 1990  . Icd 2011  . Hernia repair     umbilical and RIH  . Aneurysm coiling   . Rotator cuff repair     bilateral    History   Social History  . Marital Status: Married    Spouse Name: N/A    Number of Children: N/A  . Years of Education: N/A   Occupational History  . Not on file.   Social History Main Topics  . Smoking status: Former Smoker -- 30 years    Types: Cigarettes    Quit date: 11/28/1987  . Smokeless tobacco: Never Used  . Alcohol Use: Yes  . Drug Use: No  . Sexually Active: Not on file   Other Topics Concern  . Not on file   Social History Narrative  . No narrative on file    Family History  Problem Relation Age of Onset  . Heart disease Mother   . Diabetes Mother   . Cancer Brother     colon  . Heart disease Brother     heart attack  . Stroke Other   . Hypertension Other      Current outpatient prescriptions:amLODipine (NORVASC) 5 MG tablet, Take 5 mg by mouth daily.  , Disp: , Rfl: ;  AMOXICILLIN PO, Take by mouth 2 (two) times daily.  , Disp: , Rfl: ;  aspirin 325 MG tablet, Take 325 mg by mouth daily.  , Disp: , Rfl: ;  benazepril (LOTENSIN) 20 MG tablet, Take 20 mg by mouth daily.  , Disp: , Rfl: ;  doxazosin (CARDURA) 4 MG tablet, Take 4 mg by mouth at bedtime.  , Disp: , Rfl:  felodipine (PLENDIL) 5 MG 24 hr tablet, Take 5 mg by mouth daily.  , Disp: , Rfl: ;  furosemide (LASIX) 20 MG tablet, Take 20 mg by mouth 2 (two) times daily. , Disp: , Rfl: ;  HYDROcodone-acetaminophen (NORCO) 5-325 MG per tablet, Take by mouth as directed.  , Disp: , Rfl: ;  insulin glargine (LANTUS) 100 UNIT/ML injection, Inject 35 Units into the skin at bedtime.  , Disp: , Rfl:  lisinopril (PRINIVIL,ZESTRIL) 40 MG tablet, Take 40 mg by mouth daily.  , Disp: , Rfl: ;  metFORMIN (GLUCOPHAGE) 1000 MG  tablet, Take 1,000 mg by mouth 2 (two) times daily.  , Disp: , Rfl: ;  metoprolol (LOPRESSOR) 100 MG tablet, Take 100 mg by mouth 2 (two) times daily.  , Disp: , Rfl: ;  nitroGLYCERIN (NITROSTAT) 0.4 MG SL tablet, Place 0.4 mg under the tongue every 5 (five) minutes as needed.  , Disp: , Rfl:  ranolazine (RANEXA) 500 MG 12 hr tablet, Take 500 mg by mouth 2 (two) times daily.  , Disp: , Rfl: ;  simvastatin (ZOCOR) 40 MG tablet, Take 40 mg by mouth at bedtime.  , Disp: , Rfl: ;  telmisartan (MICARDIS) 80 MG tablet, Take 80 mg by mouth daily.  , Disp: , Rfl: ;  amoxicillin (AMOXIL) 500 MG capsule, , Disp: , Rfl: ;  furosemide (LASIX) 40 MG tablet, , Disp: , Rfl: ;  NIFEDIAC CC 30 MG 24 hr tablet, , Disp: , Rfl:  omeprazole (PRILOSEC) 20 MG capsule, , Disp: , Rfl: ;  traMADol (ULTRAM) 50 MG tablet, , Disp: , Rfl:   Allergies as of 08/11/2011  . (No Known Allergies)    Review of Systems (Positive items in bold and italic, otherwise negative)  General: Weight loss, Weight gain, Loss of appetite,  Fever  Neurologic: Dizziness, Blackouts, Headaches, Seizure  Ear/Nose/Throat: Change in eyesight, Change in hearing, Nose bleeds, Sore throat  Vascular: Pain in legs with walking, Pain in feet while lying flat, Non-healing ulcer, Stroke, "Mini stroke", Slurred speech, Temporary blindness, Blood clot in vein, Phlebitis  Pulmonary: Home oxygen, Productive cough, Bronchitis, Coughing up blood, Asthma, Wheezing  Musculoskeletal: Arthritis, Joint pain, Muscle pain  Cardiac: Chest pain, Chest tightness/pressure, Shortness of breath when lying flat, Shortness of breath with exertion, Palpitations, Heart murmur, Arrythmia, Atrial fibrillation  Hematologic: Bleeding problems, Clotting disorder, Anemia  Psychiatric:  Depression, Anxiety, Attention deficit disorder  Gastrointestinal:  Black stool, Blood in stool, Peptic ulcer disease, Reflux, Hiatal hernia, Trouble swallowing, Diarrhea, Constipation  Urinary:  Kidney disease, Burning with urination, Frequent urination, Difficulty urinating  Skin: Ulcers, Rashes  Physical Examination  Filed Vitals:   08/11/11 1528  BP: 172/70  Pulse: 86  Resp: 18   General: A&O x 3, WDWN, obese  Head: [place]/AT  Ear/Nose/Throat: Hearing grossly intact, nares w/o erythema or drainage, oropharynx w/o Erythema/Exudate  Eyes: PERRLA, EOMI  Neck: Supple, no nuchal rigidity, no palpable LAD  Pulmonary: Sym exp, good air movt, CTAB, no rales, rhonchi, & wheezing  Cardiac: RRR, Nl S1, S2, no Murmurs, rubs or gallops  Vascular: Vessel Right Left  Radial Palpable Palpable  Brachial Palpable Palpable  Carotid Palpable, with bruit Palpable, with bruit  Aorta Non-palpable N/A  Femoral Palpable Palpable   Gastrointestinal: soft, NTND, -G/R, - HSM, - masses, - CVAT B, mildly obese  Musculoskeletal: M/S 5/5 throughout , Extremities without ischemic changes  Neurologic: CN 2-12 intact , Pain and light touch intact in extremities , Motor exam as listed  above  Psychiatric: Judgment intact, Mood & affect appropriate for pt's clinical situation  Dermatologic: See M/S exam for extremity exam, no rashes otherwise noted  Lymph : No Cervical, Axillary, or Inguinal lymphadenopathy   Non-Invasive Vascular Imaging  Vein Mapping  (Date: 08/11/11):   R arm: acceptable vein conduits include R upper arm cephalic and basilic veins  L arm: acceptable vein conduits include L upper arm cephalic and basilic veins  Outside Studies/Documentation 10 pages of outside documents were reviewed including: clinic notes.  Medical Decision Making  Clerance Umland is a 77 y.o. male  who presents with CKD IV.   Based on vein mapping and examination, this patient's permanent access options include: R BC AVF vs R BVT.  Left arm access is not indicated as patient has a PPM with wire in the L SCV which eventually will lead to stenosis of that vein  I had an extensive discussion with this patient in regards to the nature of access surgery, including risk, benefits, and alternatives.    The patient is aware that the risks of access surgery include but are not limited to: bleeding, infection, steal syndrome, nerve damage, ischemic monomelic neuropathy, failure of access to mature, and possible need for additional access procedures in the future.  The patient is leaning toward peritoneal dialysis as he was told it would less of a burden for his heart.  I told the patient, I do not recommend placement of both peritoneal dialysis catheter and arm fistula, as the fistula carries a risk of CHF, steal, and nerve damage which can not be justified if it isn't the primary modality for dialysis  Subsequently, the patient will discuss his options with his Nephrologist and let us know if he wants to proceed with a R BC AVF.  Thank you for letting us participate in this patient's care.  Leonides Sake, MD [facility] Office: [phone] Pager:  [phone]

## 2011-08-17 NOTE — Procedures (Unsigned)
CEPHALIC VEIN MAPPING  INDICATION:  Chronic kidney disease stage III.  HISTORY: MI, coronary artery bypass graft, hypertension, diabetes.  EXAM: The right cephalic vein is compressible.  Diameter measurements range from 0.20 to 0.42 cm.  The right basilic vein is compressible.  Diameter measurements range from 0.38 cm to 0.58 cm.  The left cephalic vein is compressible.  Diameter measurements range from 0.16 to 0.49 cm.  The basilic vein is compressible.  Diameter measurements range from 0.34 to 0.70 cm.  See attached worksheet for all measurements.  IMPRESSION:  Patent bilateral cephalic and basilic veins with diameter measurements as described above, branches also noted on worksheet bilaterally.  ___________________________________________ Fransisco Hertz, MD  SH/MEDQ  D:  08/11/2011  T:  08/11/2011  Job:  [id number]

## 2011-10-20 ENCOUNTER — Ambulatory Visit (INDEPENDENT_AMBULATORY_CARE_PROVIDER_SITE_OTHER): Payer: PRIVATE HEALTH INSURANCE | Admitting: *Deleted

## 2011-10-20 ENCOUNTER — Other Ambulatory Visit: Payer: Self-pay | Admitting: Internal Medicine

## 2011-10-20 ENCOUNTER — Encounter: Payer: Self-pay | Admitting: Internal Medicine

## 2011-10-20 DIAGNOSIS — Z95810 Presence of automatic (implantable) cardiac defibrillator: Secondary | ICD-10-CM

## 2011-10-20 DIAGNOSIS — I472 Ventricular tachycardia: Secondary | ICD-10-CM

## 2011-10-20 DIAGNOSIS — I5022 Chronic systolic (congestive) heart failure: Secondary | ICD-10-CM

## 2011-10-20 LAB — REMOTE ICD DEVICE
ATRIAL PACING ICD: 53.71 pct
BAMS-0001: 170 {beats}/min
CHARGE TIME: 8.888 s
DEV-0020ICD: NEGATIVE
FVT: 0
PACEART VT: 0
RV LEAD AMPLITUDE: 3.375 mv
RV LEAD IMPEDENCE ICD: 437 Ohm
TOT-0001: 1
TOT-0002: 0
TZAT-0001FASTVT: 1
TZAT-0001SLOWVT: 1
TZAT-0002ATACH: NEGATIVE
TZAT-0002FASTVT: NEGATIVE
TZAT-0004SLOWVT: 8
TZAT-0012ATACH: 150 ms
TZAT-0012ATACH: 150 ms
TZAT-0012SLOWVT: 200 ms
TZAT-0013SLOWVT: 3
TZAT-0018ATACH: NEGATIVE
TZAT-0018FASTVT: NEGATIVE
TZAT-0018SLOWVT: NEGATIVE
TZAT-0019ATACH: 6 V
TZAT-0019ATACH: 6 V
TZAT-0020ATACH: 1.5 ms
TZAT-0020ATACH: 1.5 ms
TZAT-0020SLOWVT: 1.5 ms
TZON-0003ATACH: 350 ms
TZON-0003SLOWVT: 340 ms
TZON-0003VSLOWVT: 450 ms
TZON-0004SLOWVT: 40
TZON-0005SLOWVT: 12
TZST-0001FASTVT: 2
TZST-0001FASTVT: 3
TZST-0001FASTVT: 4
TZST-0001SLOWVT: 2
TZST-0001SLOWVT: 4
TZST-0001SLOWVT: 5
TZST-0002ATACH: NEGATIVE
TZST-0002FASTVT: NEGATIVE
TZST-0002FASTVT: NEGATIVE
TZST-0002FASTVT: NEGATIVE
TZST-0003SLOWVT: 35 J
TZST-0003SLOWVT: 35 J

## 2011-10-25 NOTE — Progress Notes (Signed)
Remote icd check w/icm  

## 2011-11-02 ENCOUNTER — Encounter: Payer: Self-pay | Admitting: *Deleted

## 2011-11-23 ENCOUNTER — Telehealth: Payer: Self-pay | Admitting: Internal Medicine

## 2011-11-23 NOTE — Telephone Encounter (Signed)
New message:  Going [place] of [place] on Monday.  They will not have a landline to hook up device.  Does this need to be hooked up each night.  They will be back on January 9.  Please call and advise.  [phone] until 4:30

## 2011-11-23 NOTE — Telephone Encounter (Signed)
Spoke with wife and explained that he does not have to take the transmitter on vacation with him.  He is not scheduled to download until February.

## 2012-01-18 ENCOUNTER — Ambulatory Visit (INDEPENDENT_AMBULATORY_CARE_PROVIDER_SITE_OTHER): Payer: PRIVATE HEALTH INSURANCE | Admitting: *Deleted

## 2012-01-18 ENCOUNTER — Encounter: Payer: Self-pay | Admitting: Internal Medicine

## 2012-01-18 DIAGNOSIS — I472 Ventricular tachycardia: Secondary | ICD-10-CM

## 2012-01-18 DIAGNOSIS — I5022 Chronic systolic (congestive) heart failure: Secondary | ICD-10-CM

## 2012-01-22 LAB — REMOTE ICD DEVICE
AL IMPEDENCE ICD: 418 Ohm
AL THRESHOLD: 0.625 V
BATTERY VOLTAGE: 3.1517 V
DEV-0020ICD: NEGATIVE
FVT: 0
RV LEAD THRESHOLD: 0.75 V
TOT-0006: 20110510000000
TZAT-0001ATACH: 1
TZAT-0001ATACH: 2
TZAT-0001ATACH: 3
TZAT-0002ATACH: NEGATIVE
TZAT-0004SLOWVT: 8
TZAT-0005SLOWVT: 88 pct
TZAT-0011SLOWVT: 10 ms
TZAT-0012ATACH: 150 ms
TZAT-0012FASTVT: 200 ms
TZAT-0018ATACH: NEGATIVE
TZAT-0018ATACH: NEGATIVE
TZAT-0018ATACH: NEGATIVE
TZAT-0019ATACH: 6 V
TZAT-0019FASTVT: 8 V
TZAT-0020ATACH: 1.5 ms
TZAT-0020FASTVT: 1.5 ms
TZON-0004VSLOWVT: 20
TZST-0001ATACH: 5
TZST-0001ATACH: 6
TZST-0001FASTVT: 3
TZST-0001FASTVT: 5
TZST-0001SLOWVT: 3
TZST-0001SLOWVT: 5
TZST-0001SLOWVT: 6
TZST-0002ATACH: NEGATIVE
TZST-0002ATACH: NEGATIVE
TZST-0002ATACH: NEGATIVE
TZST-0002FASTVT: NEGATIVE
TZST-0002FASTVT: NEGATIVE
TZST-0003SLOWVT: 20 J
TZST-0003SLOWVT: 35 J
TZST-0003SLOWVT: 35 J
VENTRICULAR PACING ICD: 2.14 pct
VF: 0

## 2012-01-25 NOTE — Progress Notes (Signed)
Remote icd check w/icm  

## 2012-02-12 ENCOUNTER — Encounter: Payer: Self-pay | Admitting: Internal Medicine

## 2012-02-12 ENCOUNTER — Ambulatory Visit (INDEPENDENT_AMBULATORY_CARE_PROVIDER_SITE_OTHER): Payer: PRIVATE HEALTH INSURANCE | Admitting: Internal Medicine

## 2012-02-12 VITALS — BP 124/56 | HR 76 | Wt 194.0 lb

## 2012-02-12 DIAGNOSIS — I5022 Chronic systolic (congestive) heart failure: Secondary | ICD-10-CM

## 2012-02-12 DIAGNOSIS — Z95810 Presence of automatic (implantable) cardiac defibrillator: Secondary | ICD-10-CM

## 2012-02-12 DIAGNOSIS — I4891 Unspecified atrial fibrillation: Secondary | ICD-10-CM | POA: Insufficient documentation

## 2012-02-12 LAB — ICD DEVICE OBSERVATION
BAMS-0001: 170 {beats}/min
DEV-0020ICD: NEGATIVE
TZAT-0001ATACH: 2
TZAT-0001ATACH: 3
TZAT-0002ATACH: NEGATIVE
TZAT-0002ATACH: NEGATIVE
TZAT-0004SLOWVT: 8
TZAT-0011SLOWVT: 10 ms
TZAT-0012FASTVT: 200 ms
TZAT-0012SLOWVT: 200 ms
TZAT-0018ATACH: NEGATIVE
TZAT-0019ATACH: 6 V
TZAT-0019ATACH: 6 V
TZAT-0019ATACH: 6 V
TZAT-0019SLOWVT: 8 V
TZAT-0020FASTVT: 1.5 ms
TZON-0003SLOWVT: 340 ms
TZON-0004VSLOWVT: 20
TZST-0001ATACH: 5
TZST-0001ATACH: 6
TZST-0001FASTVT: 2
TZST-0001SLOWVT: 3
TZST-0001SLOWVT: 5
TZST-0002ATACH: NEGATIVE
TZST-0002FASTVT: NEGATIVE
TZST-0002FASTVT: NEGATIVE
TZST-0003SLOWVT: 20 J
TZST-0003SLOWVT: 35 J

## 2012-02-12 NOTE — Patient Instructions (Addendum)
Your physician wants you to follow-up in: 6 months.  You will receive a reminder letter in the mail two months in advance. If you don't receive a letter, please call our office to schedule the follow-up appointment.  LASIX: Take 80 mg twice daily for 4 days, THEN 80mg  in the morning and 40mg  in the evening.

## 2012-02-12 NOTE — Assessment & Plan Note (Signed)
His symptoms are class 2-3. He has increase in his fluid index with ICD interrogation. He also has peripheral edema. I've asked the patient to take 80 mg of Lasix twice a day for 4 days followed by a decrease in Lasix to 80 mg in the morning and 40 mg in the evening. He is instructed to increase his fruit intake of bananas and oranges to increase his potassium.

## 2012-02-12 NOTE — Assessment & Plan Note (Signed)
He is currently maintaining sinus rhythm. Review of his ICD interrogation demonstrates that he is at rhythm approximately 1% of the time.

## 2012-02-12 NOTE — Progress Notes (Signed)
HPI Jason Walker returns today for followup. He is a very pleasant 78 year old man with an ischemic cardiomyopathy, chronic systolic heart failure, atrial fibrillation, status post ICD implantation. The patient's dyspnea is improved. He has required escalating doses of Lasix. He denies syncope or chest pain. He continues to have chronic peripheral edema and class 2-3 heart failure symptoms, mostly class II.  Allergies  Allergen Reactions  . Ativan   . Morphine And Related      Current Outpatient Prescriptions  Medication Sig Dispense Refill  . aspirin 81 MG tablet Take 81 mg by mouth daily.      Marland Kitchen doxazosin (CARDURA) 4 MG tablet Take 4 mg by mouth at bedtime.        . felodipine (PLENDIL) 5 MG 24 hr tablet Take 5 mg by mouth daily.        . furosemide (LASIX) 40 MG tablet Take 40 mg by mouth daily.       Marland Kitchen HYDROcodone-acetaminophen (NORCO) 5-325 MG per tablet Take by mouth every 4 (four) hours as needed.       . insulin glargine (LANTUS) 100 UNIT/ML injection Inject 35 Units into the skin at bedtime.        Marland Kitchen lisinopril (PRINIVIL,ZESTRIL) 40 MG tablet Take 40 mg by mouth daily.        . metFORMIN (GLUCOPHAGE) 1000 MG tablet Take 1,000 mg by mouth 2 (two) times daily.        . metoprolol succinate (TOPROL-XL) 12.5 mg TB24 Take 12.5 mg by mouth 2 (two) times daily.      Marland Kitchen NIFEdipine (PROCARDIA XL/ADALAT-CC) 60 MG 24 hr tablet Take 60 mg by mouth 2 (two) times daily.      . nitroGLYCERIN (NITROSTAT) 0.4 MG SL tablet Place 0.4 mg under the tongue every 5 (five) minutes as needed.        Marland Kitchen omeprazole (PRILOSEC) 20 MG capsule Take 20 mg by mouth daily.       . ranolazine (RANEXA) 500 MG 12 hr tablet Take 500 mg by mouth 2 (two) times daily.        . simvastatin (ZOCOR) 40 MG tablet Take 40 mg by mouth at bedtime.        Marland Kitchen warfarin (COUMADIN) 7.5 MG tablet Take 7.5 mg by mouth as directed.         Past Medical History  Diagnosis Date  . RBBB (right bundle branch block)   . AAA (abdominal  aortic aneurysm)   . AMI (acute myocardial infarction)   . Diabetes mellitus   . Coronary artery disease   . Pacemaker   . Defibrillator activation   . Hypertension   . Heart disease   . Heart attack   . Wears dentures   . Hyperlipidemia   . Shortness of breath   . Kidney disease   . Hyperpotassemia   . Edema     ROS:   All systems reviewed and negative except as noted in the HPI.   Past Surgical History  Procedure Date  . Coronary artery bypass graft 1990  . Icd 2011  . Hernia repair     umbilical and RIH  . Aneurysm coiling   . Rotator cuff repair     bilateral     Family History  Problem Relation Age of Onset  . Heart disease Mother   . Diabetes Mother   . Cancer Brother     colon  . Heart disease Brother     heart attack  .  Stroke Other   . Hypertension Other      History   Social History  . Marital Status: Married    Spouse Name: N/A    Number of Children: N/A  . Years of Education: N/A   Occupational History  . Not on file.   Social History Main Topics  . Smoking status: Former Smoker -- 30 years    Types: Cigarettes    Quit date: 11/28/1987  . Smokeless tobacco: Never Used  . Alcohol Use: Yes  . Drug Use: No  . Sexually Active: Not on file   Other Topics Concern  . Not on file   Social History Narrative  . No narrative on file     BP 124/56  Pulse 76  Wt 87.998 kg (194 lb)  Physical Exam:  Well appearing  elderly man, NAD HEENT: Unremarkable Neck:  7 cm JVD, no thyromegally Lungs:  Clear with no wheezes, rhonchi, and minimal basilar rales  HEART:  Regular rate rhythm, no murmurs, no rubs, no clicks Abd:  soft, positive bowel sounds, no organomegally, no rebound, no guarding Ext:  2 plus pulses, no edema, no cyanosis, no clubbing Skin:  No rashes no nodules Neuro:  CN II through XII intact, motor grossly intact  DEVICE  Normal device function.  See PaceArt for details. Fluid index elevated  Assess/Plan:

## 2012-02-12 NOTE — Assessment & Plan Note (Signed)
His device is working normally. We'll plan to recheck in several months. 

## 2012-03-06 ENCOUNTER — Other Ambulatory Visit: Payer: Self-pay | Admitting: Internal Medicine

## 2012-03-06 NOTE — Telephone Encounter (Signed)
New problem:  Patient wife calling, went to see the kidney MD. Ketone level are up. Need to discuss.

## 2012-03-06 NOTE — Telephone Encounter (Signed)
Patient will send a transmission tonight  We will check his Optivol on device to see his fluid status and call the wife back

## 2012-03-07 NOTE — Telephone Encounter (Signed)
Kidney doctor wants him to decrease his Furosemide to 40mg  bid.  His Optivol is much improved and he can go ahead with this change and call us if he has any problems with swelling

## 2012-03-07 NOTE — Telephone Encounter (Signed)
F/u   Patient wife calling for status update, she can be reached at [phone] until 4:30pm today

## 2012-03-07 NOTE — Telephone Encounter (Signed)
Addended by: Dennis Bast F on: 03/07/2012 04:09 PM   Modules accepted: Orders

## 2012-03-11 NOTE — Telephone Encounter (Signed)
Addended by: Dennis Bast F on: 03/11/2012 12:05 PM   Modules accepted: Orders

## 2012-04-18 ENCOUNTER — Encounter: Payer: Self-pay | Admitting: Internal Medicine

## 2012-04-18 ENCOUNTER — Ambulatory Visit (INDEPENDENT_AMBULATORY_CARE_PROVIDER_SITE_OTHER): Payer: PRIVATE HEALTH INSURANCE | Admitting: *Deleted

## 2012-04-18 DIAGNOSIS — Z95810 Presence of automatic (implantable) cardiac defibrillator: Secondary | ICD-10-CM

## 2012-04-18 DIAGNOSIS — I5022 Chronic systolic (congestive) heart failure: Secondary | ICD-10-CM

## 2012-04-19 LAB — REMOTE ICD DEVICE
AL AMPLITUDE: 3.5 mv
AL IMPEDENCE ICD: 475 Ohm
BAMS-0001: 170 {beats}/min
BATTERY VOLTAGE: 3.1244 V
CHARGE TIME: 9.118 s
DEV-0020ICD: NEGATIVE
PACEART VT: 0
RV LEAD AMPLITUDE: 1.625 mv
RV LEAD IMPEDENCE ICD: 437 Ohm
RV LEAD THRESHOLD: 0.75 V
TOT-0001: 1
TOT-0002: 0
TOT-0006: 20110510000000
TZAT-0001ATACH: 3
TZAT-0001SLOWVT: 1
TZAT-0002ATACH: NEGATIVE
TZAT-0004SLOWVT: 8
TZAT-0005SLOWVT: 88 pct
TZAT-0012ATACH: 150 ms
TZAT-0012FASTVT: 200 ms
TZAT-0013SLOWVT: 3
TZAT-0018ATACH: NEGATIVE
TZAT-0018FASTVT: NEGATIVE
TZAT-0019ATACH: 6 V
TZAT-0019ATACH: 6 V
TZAT-0019FASTVT: 8 V
TZAT-0020ATACH: 1.5 ms
TZAT-0020ATACH: 1.5 ms
TZAT-0020SLOWVT: 1.5 ms
TZON-0003SLOWVT: 340 ms
TZON-0003VSLOWVT: 450 ms
TZON-0004SLOWVT: 40
TZST-0001ATACH: 5
TZST-0001ATACH: 6
TZST-0001FASTVT: 2
TZST-0001FASTVT: 4
TZST-0001FASTVT: 6
TZST-0001SLOWVT: 2
TZST-0001SLOWVT: 4
TZST-0001SLOWVT: 5
TZST-0002ATACH: NEGATIVE
TZST-0002FASTVT: NEGATIVE
TZST-0002FASTVT: NEGATIVE
TZST-0002FASTVT: NEGATIVE
TZST-0003SLOWVT: 20 J
TZST-0003SLOWVT: 35 J
TZST-0003SLOWVT: 35 J

## 2012-04-29 ENCOUNTER — Encounter: Payer: Self-pay | Admitting: *Deleted

## 2012-07-23 ENCOUNTER — Ambulatory Visit (INDEPENDENT_AMBULATORY_CARE_PROVIDER_SITE_OTHER): Payer: 59 | Admitting: Internal Medicine

## 2012-07-23 ENCOUNTER — Encounter: Payer: Self-pay | Admitting: Internal Medicine

## 2012-07-23 VITALS — BP 110/58 | HR 60 | Ht 65.0 in | Wt 197.4 lb

## 2012-07-23 DIAGNOSIS — I5022 Chronic systolic (congestive) heart failure: Secondary | ICD-10-CM

## 2012-07-23 DIAGNOSIS — I472 Ventricular tachycardia, unspecified: Secondary | ICD-10-CM

## 2012-07-23 DIAGNOSIS — Z95810 Presence of automatic (implantable) cardiac defibrillator: Secondary | ICD-10-CM

## 2012-07-23 LAB — ICD DEVICE OBSERVATION
AL AMPLITUDE: 3.125 mv
AL IMPEDENCE ICD: 437 Ohm
ATRIAL PACING ICD: 48.92 pct
RV LEAD IMPEDENCE ICD: 437 Ohm
TOT-0001: 1
TOT-0006: 20110510000000
TZAT-0001ATACH: 1
TZAT-0001FASTVT: 1
TZAT-0002ATACH: NEGATIVE
TZAT-0002ATACH: NEGATIVE
TZAT-0002FASTVT: NEGATIVE
TZAT-0012ATACH: 150 ms
TZAT-0012FASTVT: 200 ms
TZAT-0012SLOWVT: 200 ms
TZAT-0018ATACH: NEGATIVE
TZAT-0018ATACH: NEGATIVE
TZAT-0019ATACH: 6 V
TZAT-0019ATACH: 6 V
TZAT-0019SLOWVT: 8 V
TZAT-0020ATACH: 1.5 ms
TZAT-0020SLOWVT: 1.5 ms
TZON-0003ATACH: 350 ms
TZON-0003SLOWVT: 340 ms
TZST-0001ATACH: 5
TZST-0001FASTVT: 4
TZST-0001FASTVT: 5
TZST-0001SLOWVT: 4
TZST-0001SLOWVT: 6
TZST-0002ATACH: NEGATIVE
TZST-0002FASTVT: NEGATIVE
TZST-0002FASTVT: NEGATIVE
TZST-0002FASTVT: NEGATIVE
TZST-0003SLOWVT: 20 J
TZST-0003SLOWVT: 35 J
VF: 0

## 2012-07-23 NOTE — Patient Instructions (Addendum)
Please send in remote [facility] on 10/28/12.  Your physician has recommended you make the following change in your medication:  INCREASE LASIX TO 80 MG TWICE DAILY UNTIL YOUR WEIGHT IS BELOW 190  Your physician wants you to follow-up in: 1 YEAR WITH DR. Ladona Ridgel IN [place] OFFICE. You will receive a reminder letter in the mail two months in advance. If you don't receive a letter, please call our office to schedule the follow-up appointment.   PER DR. Ladona Ridgel WE WILL SEND IN AN ORDER FOR PORTABLE O2 TANK OCD REGULATOR M6-TANK. I WILL [facility].

## 2012-07-23 NOTE — Progress Notes (Signed)
HPI Jason Walker returns today for followup. He is a very pleasant 78 year old man with an ischemic cardiomyopathy, chronic systolic heart failure, paroxysmal atrial fibrillation, ventricular tachycardia, status post ICD implantation. He also has chronic renal insufficiency and is recently undergone insertion of a right arm fistula. The patient denies chest pain. He has had mild peripheral edema and cough. His weight has gone up 3-4 or more pounds. He denies dietary indiscretion. No ICD shocks. Allergies  Allergen Reactions  . Lorazepam   . Morphine And Related      Current Outpatient Prescriptions  Medication Sig Dispense Refill  . aspirin 81 MG tablet Take 81 mg by mouth daily.      Marland Kitchen doxazosin (CARDURA) 4 MG tablet Take 4 mg by mouth at bedtime.        . felodipine (PLENDIL) 5 MG 24 hr tablet Take 5 mg by mouth daily.        . furosemide (LASIX) 40 MG tablet Take 1 tablet (40 mg total) by mouth 2 (two) times daily.  30 tablet    . insulin glargine (LANTUS) 100 UNIT/ML injection Inject 35 Units into the skin at bedtime.        Marland Kitchen lisinopril (PRINIVIL,ZESTRIL) 40 MG tablet Take 40 mg by mouth daily.        . metFORMIN (GLUCOPHAGE) 1000 MG tablet Take 1,000 mg by mouth 2 (two) times daily.        . metoprolol succinate (TOPROL-XL) 12.5 mg TB24 Take 12.5 mg by mouth 2 (two) times daily.      Marland Kitchen NIFEdipine (PROCARDIA XL/ADALAT-CC) 60 MG 24 hr tablet Take 60 mg by mouth 2 (two) times daily.      . nitroGLYCERIN (NITROSTAT) 0.4 MG SL tablet Place 0.4 mg under the tongue every 5 (five) minutes as needed.        Marland Kitchen omeprazole (PRILOSEC) 20 MG capsule Take 20 mg by mouth daily.       . ranolazine (RANEXA) 500 MG 12 hr tablet Take 500 mg by mouth 2 (two) times daily.        . simvastatin (ZOCOR) 40 MG tablet Take 20 mg by mouth at bedtime.       Marland Kitchen warfarin (COUMADIN) 7.5 MG tablet Take 7.5 mg by mouth as directed.      Marland Kitchen DISCONTD: amLODipine (NORVASC) 5 MG tablet Take 5 mg by mouth daily.        Marland Kitchen  DISCONTD: benazepril (LOTENSIN) 20 MG tablet Take 20 mg by mouth daily.        Marland Kitchen DISCONTD: metoprolol (LOPRESSOR) 100 MG tablet Take 100 mg by mouth 2 (two) times daily.        Marland Kitchen DISCONTD: telmisartan (MICARDIS) 80 MG tablet Take 80 mg by mouth daily.           Past Medical History  Diagnosis Date  . RBBB (right bundle branch block)   . AAA (abdominal aortic aneurysm)   . AMI (acute myocardial infarction)   . Diabetes mellitus   . Coronary artery disease   . Pacemaker   . Defibrillator activation   . Hypertension   . Heart disease   . Heart attack   . Wears dentures   . Hyperlipidemia   . Shortness of breath   . Kidney disease   . Hyperpotassemia   . Edema     ROS:   All systems reviewed and negative except as noted in the HPI.   Past Surgical History  Procedure Date  .  Coronary artery bypass graft 1990  . Icd 2011  . Hernia repair     umbilical and RIH  . Aneurysm coiling   . Rotator cuff repair     bilateral     Family History  Problem Relation Age of Onset  . Heart disease Mother   . Diabetes Mother   . Cancer Brother     colon  . Heart disease Brother     heart attack  . Stroke Other   . Hypertension Other      History   Social History  . Marital Status: Married    Spouse Name: N/A    Number of Children: N/A  . Years of Education: N/A   Occupational History  . Not on file.   Social History Main Topics  . Smoking status: Former Smoker -- 30 years    Types: Cigarettes    Quit date: 11/28/1987  . Smokeless tobacco: Never Used  . Alcohol Use: Yes  . Drug Use: No  . Sexually Active: Not on file   Other Topics Concern  . Not on file   Social History Narrative  . No narrative on file     BP 110/58  Pulse 60  Ht 5\' 5"  (1.651 m)  Wt 197 lb 6.4 oz (89.54 kg)  BMI 32.85 kg/m2  Physical Exam:  Well appearing elderly man, NAD HEENT: Unremarkable Neck:  No JVD, no thyromegally Lungs:  Clear with no wheezes, rales, or rhonchi.  Well-healed ICD incision. HEART:  Regular rate rhythm, no murmurs, no rubs, no clicks Abd:  soft, positive bowel sounds, no organomegally, no rebound, no guarding Ext:  2 plus pulses, no edema, no cyanosis, no clubbing Skin:  No rashes no nodules Neuro:  CN II through XII intact, motor grossly intact  DEVICE  Normal device function.  See PaceArt for details.   Assess/Plan:

## 2012-07-23 NOTE — Assessment & Plan Note (Signed)
The patient's congestive heart failure is slightly worse. He denies dietary indiscretion. His weight is up 4-5 pounds. His fluid index on interrogation of his defibrillator is also up. I recommended that the patient increase his Lasix to 80 mg twice a day until his weight dropped below 190 pounds. At this point he is instructed to go back to 40 mg twice a day.

## 2012-07-23 NOTE — Assessment & Plan Note (Signed)
His device is working normally. We'll plan to recheck in several months. 

## 2012-07-23 NOTE — Assessment & Plan Note (Signed)
He has had no recurrent ventricular arrhythmias. He will continue his current medical therapy. 

## 2012-07-24 ENCOUNTER — Encounter: Payer: Self-pay | Admitting: Internal Medicine

## 2012-10-28 ENCOUNTER — Encounter: Payer: 59 | Admitting: *Deleted

## 2012-11-05 ENCOUNTER — Encounter: Payer: Self-pay | Admitting: *Deleted

## 2012-11-11 ENCOUNTER — Telehealth: Payer: Self-pay | Admitting: Internal Medicine

## 2012-11-11 NOTE — Telephone Encounter (Signed)
plz return call to pt wife [phone] regarding remote transmission.

## 2012-11-22 ENCOUNTER — Encounter: Payer: Self-pay | Admitting: Internal Medicine

## 2012-11-22 ENCOUNTER — Ambulatory Visit (INDEPENDENT_AMBULATORY_CARE_PROVIDER_SITE_OTHER): Payer: 59 | Admitting: *Deleted

## 2012-11-22 DIAGNOSIS — Z95810 Presence of automatic (implantable) cardiac defibrillator: Secondary | ICD-10-CM

## 2012-11-22 DIAGNOSIS — I5022 Chronic systolic (congestive) heart failure: Secondary | ICD-10-CM

## 2012-11-22 DIAGNOSIS — I4729 Other ventricular tachycardia: Secondary | ICD-10-CM

## 2012-11-22 DIAGNOSIS — I472 Ventricular tachycardia: Secondary | ICD-10-CM

## 2012-11-25 LAB — REMOTE ICD DEVICE
BAMS-0001: 170 {beats}/min
BATTERY VOLTAGE: 3.0903 V
CHARGE TIME: 9.369 s
DEV-0020ICD: NEGATIVE
FVT: 0
PACEART VT: 0
RV LEAD AMPLITUDE: 1.3 mv
TOT-0002: 0
TZAT-0001ATACH: 2
TZAT-0001FASTVT: 1
TZAT-0002ATACH: NEGATIVE
TZAT-0002ATACH: NEGATIVE
TZAT-0011SLOWVT: 10 ms
TZAT-0012ATACH: 150 ms
TZAT-0012FASTVT: 200 ms
TZAT-0012SLOWVT: 200 ms
TZAT-0013SLOWVT: 3
TZAT-0018ATACH: NEGATIVE
TZAT-0018ATACH: NEGATIVE
TZAT-0018SLOWVT: NEGATIVE
TZAT-0019ATACH: 6 V
TZAT-0019ATACH: 6 V
TZAT-0019SLOWVT: 8 V
TZAT-0020ATACH: 1.5 ms
TZAT-0020FASTVT: 1.5 ms
TZAT-0020SLOWVT: 1.5 ms
TZON-0003SLOWVT: 340 ms
TZON-0004SLOWVT: 40
TZON-0004VSLOWVT: 20
TZON-0005SLOWVT: 12
TZST-0001FASTVT: 2
TZST-0001FASTVT: 3
TZST-0001FASTVT: 4
TZST-0001SLOWVT: 3
TZST-0001SLOWVT: 4
TZST-0001SLOWVT: 6
TZST-0002ATACH: NEGATIVE
TZST-0002FASTVT: NEGATIVE
TZST-0002FASTVT: NEGATIVE
TZST-0002FASTVT: NEGATIVE
TZST-0003SLOWVT: 20 J
TZST-0003SLOWVT: 35 J
TZST-0003SLOWVT: 35 J
VENTRICULAR PACING ICD: 25.89 pct

## 2012-11-29 ENCOUNTER — Encounter: Payer: Self-pay | Admitting: *Deleted

## 2013-02-04 ENCOUNTER — Ambulatory Visit (INDEPENDENT_AMBULATORY_CARE_PROVIDER_SITE_OTHER): Payer: Medicare Other | Admitting: Internal Medicine

## 2013-02-04 ENCOUNTER — Encounter: Payer: Self-pay | Admitting: Internal Medicine

## 2013-02-04 VITALS — BP 130/83 | HR 107 | Wt 192.0 lb

## 2013-02-04 DIAGNOSIS — I5022 Chronic systolic (congestive) heart failure: Secondary | ICD-10-CM

## 2013-02-04 LAB — ICD DEVICE OBSERVATION
AL AMPLITUDE: 0.375 mv
AL IMPEDENCE ICD: 475 Ohm
ATRIAL PACING ICD: 6.43 pct
BRDY-0002RV: 60 {beats}/min
CHARGE TIME: 9.369 s
FVT: 0
RV LEAD IMPEDENCE ICD: 475 Ohm
TOT-0001: 1
TOT-0002: 0
TOT-0006: 20110510000000
TZAT-0001ATACH: 1
TZAT-0001SLOWVT: 1
TZAT-0002ATACH: NEGATIVE
TZAT-0002ATACH: NEGATIVE
TZAT-0002ATACH: NEGATIVE
TZAT-0012ATACH: 150 ms
TZAT-0012FASTVT: 200 ms
TZAT-0018ATACH: NEGATIVE
TZAT-0018ATACH: NEGATIVE
TZAT-0019ATACH: 6 V
TZAT-0019ATACH: 6 V
TZAT-0019SLOWVT: 8 V
TZAT-0020ATACH: 1.5 ms
TZAT-0020FASTVT: 1.5 ms
TZAT-0020SLOWVT: 1.5 ms
TZON-0003ATACH: 350 ms
TZST-0001ATACH: 4
TZST-0001ATACH: 5
TZST-0001FASTVT: 4
TZST-0001FASTVT: 5
TZST-0001SLOWVT: 2
TZST-0001SLOWVT: 4
TZST-0001SLOWVT: 6
TZST-0002ATACH: NEGATIVE
TZST-0002ATACH: NEGATIVE
TZST-0002FASTVT: NEGATIVE
TZST-0002FASTVT: NEGATIVE
TZST-0003SLOWVT: 20 J
TZST-0003SLOWVT: 35 J
TZST-0003SLOWVT: 35 J

## 2013-02-04 MED ORDER — CARVEDILOL 12.5 MG PO TABS: 12.5000 mg | ORAL_TABLET | Freq: Two times a day (BID) | ORAL | Status: AC

## 2013-02-04 MED ORDER — CARVEDILOL 12.5 MG PO TABS: 12.5000 mg | ORAL_TABLET | Freq: Two times a day (BID) | ORAL | Status: DC

## 2013-02-04 NOTE — Assessment & Plan Note (Signed)
His episode of arm pain does not appear to be due to angina. I've discussed how he might be able to better sort this out. If his arm pain was to recur with exertion, and resolves with rest, I've asked the patient to call his primary cardiologist. Repeat catheterization would be a consideration. On the other hand, if exertion does not result in arm pain, additional ischemic evaluation would likely be unwarranted.

## 2013-02-04 NOTE — Patient Instructions (Addendum)
Your physician recommends that you schedule a follow-up appointment in: 4 weeks with Dr Ladona Ridgel  Your physician has recommended you make the following change in your medication:  1) Start Carvedilol 12.5mg  twice daily 2) Stop Metoprolol 3) Stop Plendil 4) Stop Procardia

## 2013-02-04 NOTE — Progress Notes (Signed)
HPI Jason Walker returns today for followup. He is a pleasant 79 yo man with a h/o an ICM, chronic systolic CHF,, now chronic atrial fibrillation and a h/o GI bleed. In the interim, he has felt poorly. He has had increased dyspnea and also notes non-exertional chest pain. He was previously on warfarin but developed GI bleeding requiring multiple transfusions. In the interim, he has had left arm (crease of the arm) pain, not associated with exertion. No syncope or ICD shock.  Allergies  Allergen Reactions  . Lorazepam   . Morphine And Related      Current Outpatient Prescriptions  Medication Sig Dispense Refill  . aspirin 81 MG tablet Take 81 mg by mouth daily.      . clopidogrel (PLAVIX) 75 MG tablet       . doxazosin (CARDURA) 4 MG tablet Take 4 mg by mouth at bedtime.        . felodipine (PLENDIL) 5 MG 24 hr tablet Take 5 mg by mouth daily.        . furosemide (LASIX) 40 MG tablet Take 1 tablet (40 mg total) by mouth 2 (two) times daily.  30 tablet    . insulin glargine (LANTUS) 100 UNIT/ML injection Inject 35 Units into the skin at bedtime.        Marland Kitchen lisinopril (PRINIVIL,ZESTRIL) 40 MG tablet Take 40 mg by mouth daily.        . metFORMIN (GLUCOPHAGE) 1000 MG tablet Take 1,000 mg by mouth 2 (two) times daily.        . metoprolol succinate (TOPROL-XL) 12.5 mg TB24 Take 12.5 mg by mouth 2 (two) times daily.      Marland Kitchen NIFEdipine (PROCARDIA XL/ADALAT-CC) 60 MG 24 hr tablet Take 60 mg by mouth 2 (two) times daily.      . nitroGLYCERIN (NITROSTAT) 0.4 MG SL tablet Place 0.4 mg under the tongue every 5 (five) minutes as needed.        Marland Kitchen omeprazole (PRILOSEC) 20 MG capsule Take 20 mg by mouth daily.       . ranolazine (RANEXA) 500 MG 12 hr tablet Take 500 mg by mouth 2 (two) times daily.        . simvastatin (ZOCOR) 40 MG tablet Take 20 mg by mouth at bedtime.       Marland Kitchen warfarin (COUMADIN) 7.5 MG tablet Take 7.5 mg by mouth as directed.      . [DISCONTINUED] amLODipine (NORVASC) 5 MG tablet Take 5 mg by  mouth daily.        . [DISCONTINUED] benazepril (LOTENSIN) 20 MG tablet Take 20 mg by mouth daily.        . [DISCONTINUED] metoprolol (LOPRESSOR) 100 MG tablet Take 100 mg by mouth 2 (two) times daily.        . [DISCONTINUED] telmisartan (MICARDIS) 80 MG tablet Take 80 mg by mouth daily.         No current facility-administered medications for this visit.     Past Medical History  Diagnosis Date  . RBBB (right bundle branch block)   . AAA (abdominal aortic aneurysm)   . AMI (acute myocardial infarction)   . Diabetes mellitus   . Coronary artery disease   . Pacemaker   . Defibrillator activation   . Hypertension   . Heart disease   . Heart attack   . Wears dentures   . Hyperlipidemia   . Shortness of breath   . Kidney disease   . Hyperpotassemia   .  Edema     ROS:   All systems reviewed and negative except as noted in the HPI.   Past Surgical History  Procedure Laterality Date  . Coronary artery bypass graft  1990  . Icd  2011  . Hernia repair      umbilical and RIH  . Aneurysm coiling    . Rotator cuff repair      bilateral     Family History  Problem Relation Age of Onset  . Heart disease Mother   . Diabetes Mother   . Cancer Brother     colon  . Heart disease Brother     heart attack  . Stroke Other   . Hypertension Other      History   Social History  . Marital Status: Married    Spouse Name: N/A    Number of Children: N/A  . Years of Education: N/A   Occupational History  . Not on file.   Social History Main Topics  . Smoking status: Former Smoker -- 30 years    Types: Cigarettes    Quit date: 11/28/1987  . Smokeless tobacco: Never Used  . Alcohol Use: Yes  . Drug Use: No  . Sexually Active: Not on file   Other Topics Concern  . Not on file   Social History Narrative  . No narrative on file     BP 130/83  Pulse 107  Wt 192 lb (87.091 kg)  BMI 31.95 kg/m2  Physical Exam:  Well appearing elderly man, NAD HEENT:  Unremarkable Neck:  8 cm JVD, no thyromegally Lymphatics:  No adenopathy Back:  No CVA tenderness Lungs:  Clear HEART:  IRegular rate rhythm, no murmurs, no rubs, no clicks Abd:  soft, positive bowel sounds, no organomegally, no rebound, no guarding Ext:  2 plus pulses, no edema, no cyanosis, no clubbing Skin:  No rashes no nodules Neuro:  CN II through XII intact, motor grossly intact  EKG Atrial fib with an RVR and RBBB and frequent PVC's. DEVICE  Normal device function.  See PaceArt for details.   Assess/Plan:

## 2013-02-04 NOTE — Assessment & Plan Note (Signed)
His heart failure symptoms appear to be worsened. He is on a very low dose of beta blocker, and multiple calcium channel blockers. Our records suggest that he is on both Procardia and felodipine. I've asked the patient to stop these medications, he discontinue Toprol. I've also asked the patient to start taking carvedilol 12.5 mg twice daily. He will likely need up titration of his medication. He may also require digoxin for rate control but for now we'll hold off on digoxin until we know what his ventricular rates are going to be like on carvedilol. I plan to see him back in approximately 4 weeks.

## 2013-02-04 NOTE — Assessment & Plan Note (Signed)
His Medtronic dual-chamber ICD is working normally. Interrogation demonstrates that he is been in atrial fibrillation now for 7 months.

## 2013-02-04 NOTE — Assessment & Plan Note (Signed)
He has had no recurrent ventricular arrhythmias. No change in medications from this perspective.

## 2013-02-04 NOTE — Assessment & Plan Note (Signed)
Interrogation of his ICD demonstrates that his atrial fibrillation has not been particularly well controlled. We will uptitrate his AV nodal blocking medications. We considered cardioversion in this patient. However because of his history of life-threatening GI bleeding, I do not think he is a candidate for systemic anticoagulation. He will continue aspirin and Plavix.

## 2013-02-09 ENCOUNTER — Inpatient Hospital Stay (HOSPITAL_COMMUNITY): Payer: Medicare Other

## 2013-02-09 ENCOUNTER — Encounter (HOSPITAL_COMMUNITY): Payer: Self-pay | Admitting: *Deleted

## 2013-02-09 ENCOUNTER — Inpatient Hospital Stay (HOSPITAL_COMMUNITY)
Admission: AD | Admit: 2013-02-09 | Discharge: 2013-02-14 | DRG: 064 | Disposition: A | Discharge status: Home / Self Care | Payer: Medicare Other | Source: Other Acute Inpatient Hospital | Attending: Internal Medicine | Admitting: Internal Medicine

## 2013-02-09 DIAGNOSIS — Z7982 Long term (current) use of aspirin: Secondary | ICD-10-CM

## 2013-02-09 DIAGNOSIS — K5900 Constipation, unspecified: Secondary | ICD-10-CM | POA: Diagnosis present

## 2013-02-09 DIAGNOSIS — Z833 Family history of diabetes mellitus: Secondary | ICD-10-CM

## 2013-02-09 DIAGNOSIS — Z823 Family history of stroke: Secondary | ICD-10-CM

## 2013-02-09 DIAGNOSIS — I252 Old myocardial infarction: Secondary | ICD-10-CM

## 2013-02-09 DIAGNOSIS — I714 Abdominal aortic aneurysm, without rupture, unspecified: Secondary | ICD-10-CM | POA: Diagnosis present

## 2013-02-09 DIAGNOSIS — I472 Ventricular tachycardia, unspecified: Secondary | ICD-10-CM | POA: Diagnosis present

## 2013-02-09 DIAGNOSIS — I509 Heart failure, unspecified: Secondary | ICD-10-CM | POA: Diagnosis present

## 2013-02-09 DIAGNOSIS — I639 Cerebral infarction, unspecified: Secondary | ICD-10-CM

## 2013-02-09 DIAGNOSIS — N184 Chronic kidney disease, stage 4 (severe): Secondary | ICD-10-CM | POA: Diagnosis present

## 2013-02-09 DIAGNOSIS — Z8249 Family history of ischemic heart disease and other diseases of the circulatory system: Secondary | ICD-10-CM

## 2013-02-09 DIAGNOSIS — I4729 Other ventricular tachycardia: Secondary | ICD-10-CM | POA: Diagnosis present

## 2013-02-09 DIAGNOSIS — I4891 Unspecified atrial fibrillation: Secondary | ICD-10-CM | POA: Diagnosis present

## 2013-02-09 DIAGNOSIS — R29810 Facial weakness: Secondary | ICD-10-CM | POA: Diagnosis present

## 2013-02-09 DIAGNOSIS — Z794 Long term (current) use of insulin: Secondary | ICD-10-CM

## 2013-02-09 DIAGNOSIS — E785 Hyperlipidemia, unspecified: Secondary | ICD-10-CM | POA: Diagnosis present

## 2013-02-09 DIAGNOSIS — K8020 Calculus of gallbladder without cholecystitis without obstruction: Secondary | ICD-10-CM | POA: Diagnosis present

## 2013-02-09 DIAGNOSIS — Z7902 Long term (current) use of antithrombotics/antiplatelets: Secondary | ICD-10-CM

## 2013-02-09 DIAGNOSIS — G936 Cerebral edema: Secondary | ICD-10-CM | POA: Diagnosis present

## 2013-02-09 DIAGNOSIS — Z95810 Presence of automatic (implantable) cardiac defibrillator: Secondary | ICD-10-CM

## 2013-02-09 DIAGNOSIS — R471 Dysarthria and anarthria: Secondary | ICD-10-CM | POA: Diagnosis present

## 2013-02-09 DIAGNOSIS — Z87891 Personal history of nicotine dependence: Secondary | ICD-10-CM

## 2013-02-09 DIAGNOSIS — I2510 Atherosclerotic heart disease of native coronary artery without angina pectoris: Secondary | ICD-10-CM | POA: Diagnosis present

## 2013-02-09 DIAGNOSIS — G911 Obstructive hydrocephalus: Secondary | ICD-10-CM | POA: Diagnosis present

## 2013-02-09 DIAGNOSIS — E109 Type 1 diabetes mellitus without complications: Secondary | ICD-10-CM | POA: Diagnosis present

## 2013-02-09 DIAGNOSIS — I5022 Chronic systolic (congestive) heart failure: Secondary | ICD-10-CM | POA: Diagnosis present

## 2013-02-09 DIAGNOSIS — Z79899 Other long term (current) drug therapy: Secondary | ICD-10-CM

## 2013-02-09 DIAGNOSIS — I129 Hypertensive chronic kidney disease with stage 1 through stage 4 chronic kidney disease, or unspecified chronic kidney disease: Secondary | ICD-10-CM | POA: Diagnosis present

## 2013-02-09 DIAGNOSIS — I6340 Cerebral infarction due to embolism of unspecified cerebral artery: Principal | ICD-10-CM | POA: Diagnosis present

## 2013-02-09 HISTORY — DX: Unspecified systolic (congestive) heart failure: I50.20

## 2013-02-09 LAB — BASIC METABOLIC PANEL
CO2: 28 mEq/L (ref 19–32)
Chloride: 96 mEq/L (ref 96–112)
Creatinine, Ser: 2.53 mg/dL — ABNORMAL HIGH (ref 0.50–1.35)
GFR calc Af Amer: 26 mL/min — ABNORMAL LOW (ref >90)
Potassium: 4.1 mEq/L (ref 3.5–5.1)
Sodium: 135 mEq/L (ref 135–145)

## 2013-02-09 LAB — CBC
Hemoglobin: 12.7 g/dL — ABNORMAL LOW (ref 13.0–17.0)
Platelets: 189 10*3/uL (ref 150–400)
RBC: 4.28 MIL/uL (ref 4.22–5.81)
WBC: 12.1 10*3/uL — ABNORMAL HIGH (ref 4.0–10.5)

## 2013-02-09 LAB — GLUCOSE, CAPILLARY: Glucose-Capillary: 157 mg/dL — ABNORMAL HIGH (ref 70–99)

## 2013-02-09 LAB — HEPATIC FUNCTION PANEL
ALT: 14 U/L (ref 0–53)
Albumin: 3.9 g/dL (ref 3.5–5.2)
Indirect Bilirubin: 0.4 mg/dL (ref 0.3–0.9)
Total Protein: 8.3 g/dL (ref 6.0–8.3)

## 2013-02-09 LAB — LIPASE, BLOOD: Lipase: 33 U/L (ref 11–59)

## 2013-02-09 MED ORDER — PANTOPRAZOLE SODIUM 40 MG PO TBEC
40.0000 mg | DELAYED_RELEASE_TABLET | Freq: Every day | ORAL | Status: DC
  Administered 2013-02-09 – 2013-02-14 (×6): 40 mg via ORAL
  Filled 2013-02-09 (×4): qty 1

## 2013-02-09 MED ORDER — ONDANSETRON HCL 4 MG/2ML IJ SOLN
4.0000 mg | Freq: Four times a day (QID) | INTRAMUSCULAR | Status: DC | PRN
  Administered 2013-02-09: 4 mg via INTRAVENOUS
  Filled 2013-02-09: qty 2

## 2013-02-09 MED ORDER — NITROGLYCERIN 0.4 MG SL SUBL: 0.4000 mg | SUBLINGUAL_TABLET | SUBLINGUAL | Status: DC | PRN

## 2013-02-09 MED ORDER — INSULIN ASPART 100 UNIT/ML ~~LOC~~ SOLN
3.0000 [IU] | Freq: Three times a day (TID) | SUBCUTANEOUS | Status: DC
  Administered 2013-02-10 – 2013-02-12 (×2): 3 [IU] via SUBCUTANEOUS
  Administered 2013-02-12: 08:00:00 via SUBCUTANEOUS
  Administered 2013-02-13 – 2013-02-14 (×5): 3 [IU] via SUBCUTANEOUS

## 2013-02-09 MED ORDER — FUROSEMIDE 40 MG PO TABS
40.0000 mg | ORAL_TABLET | Freq: Two times a day (BID) | ORAL | Status: DC
  Administered 2013-02-09 – 2013-02-12 (×6): 40 mg via ORAL
  Filled 2013-02-09 (×9): qty 1

## 2013-02-09 MED ORDER — ASPIRIN EC 81 MG PO TBEC
81.0000 mg | DELAYED_RELEASE_TABLET | Freq: Every day | ORAL | Status: DC
  Administered 2013-02-09 – 2013-02-14 (×6): 81 mg via ORAL
  Filled 2013-02-09 (×6): qty 1

## 2013-02-09 MED ORDER — RANOLAZINE ER 500 MG PO TB12
500.0000 mg | ORAL_TABLET | Freq: Two times a day (BID) | ORAL | Status: DC
  Administered 2013-02-09 – 2013-02-14 (×10): 500 mg via ORAL
  Filled 2013-02-09 (×13): qty 1

## 2013-02-09 MED ORDER — SODIUM CHLORIDE 0.9 % IV SOLN: 250.0000 mL | INTRAVENOUS | Status: DC | PRN

## 2013-02-09 MED ORDER — SIMVASTATIN 20 MG PO TABS
20.0000 mg | ORAL_TABLET | Freq: Every day | ORAL | Status: DC
  Administered 2013-02-09 – 2013-02-13 (×5): 20 mg via ORAL
  Filled 2013-02-09 (×7): qty 1

## 2013-02-09 MED ORDER — ACETAMINOPHEN 325 MG PO TABS
650.0000 mg | ORAL_TABLET | ORAL | Status: DC | PRN
  Administered 2013-02-10: 650 mg via ORAL
  Filled 2013-02-09: qty 2

## 2013-02-09 MED ORDER — INSULIN ASPART 100 UNIT/ML ~~LOC~~ SOLN
0.0000 [IU] | Freq: Three times a day (TID) | SUBCUTANEOUS | Status: DC
  Administered 2013-02-10 – 2013-02-11 (×4): 2 [IU] via SUBCUTANEOUS
  Administered 2013-02-12: 3 [IU] via SUBCUTANEOUS
  Administered 2013-02-13: 1 [IU] via SUBCUTANEOUS
  Administered 2013-02-13: 18:00:00 via SUBCUTANEOUS
  Administered 2013-02-13: 1 [IU] via SUBCUTANEOUS
  Administered 2013-02-14: 3 [IU] via SUBCUTANEOUS

## 2013-02-09 MED ORDER — SODIUM CHLORIDE 0.9 % IJ SOLN: 3.0000 mL | INTRAMUSCULAR | Status: DC | PRN

## 2013-02-09 MED ORDER — ENOXAPARIN SODIUM 40 MG/0.4ML ~~LOC~~ SOLN
40.0000 mg | SUBCUTANEOUS | Status: DC
  Administered 2013-02-09 – 2013-02-11 (×3): 40 mg via SUBCUTANEOUS
  Filled 2013-02-09 (×4): qty 0.4

## 2013-02-09 MED ORDER — DOXAZOSIN MESYLATE 4 MG PO TABS
4.0000 mg | ORAL_TABLET | Freq: Every day | ORAL | Status: DC
  Administered 2013-02-09 – 2013-02-13 (×5): 4 mg via ORAL
  Filled 2013-02-09 (×9): qty 1

## 2013-02-09 MED ORDER — SODIUM CHLORIDE 0.9 % IJ SOLN
3.0000 mL | Freq: Two times a day (BID) | INTRAMUSCULAR | Status: DC
  Administered 2013-02-10 – 2013-02-11 (×3): 3 mL via INTRAVENOUS

## 2013-02-09 MED ORDER — CARVEDILOL 12.5 MG PO TABS
12.5000 mg | ORAL_TABLET | Freq: Two times a day (BID) | ORAL | Status: DC
  Administered 2013-02-09 – 2013-02-14 (×10): 12.5 mg via ORAL
  Filled 2013-02-09 (×12): qty 1

## 2013-02-09 MED ORDER — SENNOSIDES-DOCUSATE SODIUM 8.6-50 MG PO TABS
1.0000 | ORAL_TABLET | Freq: Every evening | ORAL | Status: DC | PRN
  Administered 2013-02-11: 1 via ORAL
  Filled 2013-02-09 (×2): qty 1

## 2013-02-09 MED ORDER — SODIUM CHLORIDE 0.9 % IV SOLN
INTRAVENOUS | Status: DC
  Administered 2013-02-09 – 2013-02-12 (×3): via INTRAVENOUS

## 2013-02-09 MED ORDER — ASPIRIN 81 MG PO TABS
81.0000 mg | ORAL_TABLET | Freq: Every day | ORAL | Status: DC
Start: 2013-02-09 — End: 2013-02-09

## 2013-02-09 MED ORDER — INSULIN GLARGINE 100 UNIT/ML ~~LOC~~ SOLN
15.0000 [IU] | Freq: Every day | SUBCUTANEOUS | Status: DC
  Administered 2013-02-10: 15 [IU] via SUBCUTANEOUS
  Filled 2013-02-09: qty 0.15

## 2013-02-09 MED ORDER — ONDANSETRON HCL 4 MG/2ML IJ SOLN
4.0000 mg | INTRAMUSCULAR | Status: DC | PRN
  Administered 2013-02-09 – 2013-02-11 (×4): 4 mg via INTRAVENOUS
  Filled 2013-02-09 (×4): qty 2

## 2013-02-09 MED ORDER — ACETAMINOPHEN 650 MG RE SUPP: 650.0000 mg | RECTAL | Status: DC | PRN

## 2013-02-09 MED ORDER — PROMETHAZINE HCL 25 MG/ML IJ SOLN
12.5000 mg | Freq: Once | INTRAMUSCULAR | Status: DC
  Filled 2013-02-09: qty 1

## 2013-02-09 MED ORDER — CLOPIDOGREL BISULFATE 75 MG PO TABS
75.0000 mg | ORAL_TABLET | Freq: Every day | ORAL | Status: DC
  Administered 2013-02-10 – 2013-02-14 (×5): 75 mg via ORAL
  Filled 2013-02-09 (×7): qty 1

## 2013-02-09 NOTE — Progress Notes (Signed)
[facility] progress note. Chief complaint. Dizziness. History of present illness. This 79 year old male [facility] after being found down and unresponsive with slurred speech and left facial droop. Transfer to [facility] for stroke workup. Initial CT scan was reported as negative. Patient reported to nursing that he was experiencing dizziness. Per nursing report the patient to indicated the dizziness has been present all day. I came to see the patient at the bedside. Indicates to me that he is currently not having any dizziness. He states the dizziness occurs only when he stands up. Vital signs. Temperature 90.8, pulse 74, respiration 22, blood pressure 148/62. O2 sats 100%. General appearance. Well-developed elderly male who is alert, cooperative, in no distress. Cardiac. Rate and rhythm regular. Lungs. Breath sounds clear and equal. Abdomen. Soft with positive bowel sounds. No pain. Neurologic. A left facial droop is still evident. Speech is clear and her family this is a new improvement. Strength estimated 4/5 and equal in all 4 extremities. Pupils are equal and reactive. No evidence of nystagmus. Sensation appears intact. Impression/plan. Problem #1. Dizziness. Patient's neuro exam does not demonstrate any further decline in fact per family report his speech clarity has improved. He remains alert and oriented. Patient description of his dizziness sounds most consistent with postural hypotension. I will order postural blood pressures to follow. Nursing will continue to monitor and update me of any neurologic changes.

## 2013-02-09 NOTE — Progress Notes (Signed)
Transporter here to take patient down for chest x-ray, when started moving bed, patient became nauseated, with chalky colored emesis, and then became diaphoritic,  Order for Zofran obtained and pt medicated.  Will wait to take down for xhest x-ray.  Rechecked bblood glucose, 150mg /ml.

## 2013-02-09 NOTE — H&P (Signed)
[facility]          History and Physical    PCP:   Zachery Dauer, MD   Chief Complaint:  Unresponsive, slurred speech, left facial droop  HPI: 79 y/o man with PMH significant for a fib not on anticoagulation due to a life-threatening GI Bleed, paroxysmal v tach s/p ICD, CKD Stage IV, CAD and IDDM. He presented to [facility] today this am and was transferred to [place] due to lack of neurology coverage at [facility]. Patient was last seen normal at about 10 pm last night when he went to bed. Wife found him on the floor, unresponsive at 6:15 am covered in vomit. At his arrival to [facility], he was noted to have a prominent left facial droop and was completely unresponsive. CT scan was apparently negative. NIH score reported as 32. TPA was not given due to delay in arrival.  He is currently much more alert and can provide adequate history. He says he woke up and felt like his sugar was low and ate 2 cookies. That is the last he remembers. I do not have information of his CBG on arrival to [place]. We will be admitting him for further evaluation and management.  Allergies:   Allergies  Allergen Reactions  . Lorazepam   . Morphine And Related       Past Medical History  Diagnosis Date  . RBBB (right bundle branch block)   . AAA (abdominal aortic aneurysm)   . AMI (acute myocardial infarction)   . Diabetes mellitus   . Coronary artery disease   . Pacemaker   . Defibrillator activation   . Hypertension   . Heart disease   . Heart attack   . Wears dentures   . Hyperlipidemia   . Shortness of breath   . Kidney disease   . Hyperpotassemia   . Edema     Past Surgical History  Procedure Laterality Date  . Coronary artery bypass graft  1990  . Icd  2011  . Hernia repair      umbilical and RIH  . Aneurysm coiling    . Rotator cuff repair      bilateral    Prior to Admission medications   Medication Sig Start Date End Date Taking? Authorizing  Provider  aspirin 81 MG tablet Take 81 mg by mouth daily.    Historical Provider, MD  carvedilol (COREG) 12.5 MG tablet Take 1 tablet (12.5 mg total) by mouth 2 (two) times daily. 02/04/13   Marinus Maw, MD  clopidogrel (PLAVIX) 75 MG tablet  01/25/13   Historical Provider, MD  doxazosin (CARDURA) 4 MG tablet Take 4 mg by mouth at bedtime.      Historical Provider, MD  furosemide (LASIX) 40 MG tablet Take 1 tablet (40 mg total) by mouth 2 (two) times daily. 03/07/12   Marinus Maw, MD  insulin glargine (LANTUS) 100 UNIT/ML injection Inject 35 Units into the skin at bedtime.      Historical Provider, MD  lisinopril (PRINIVIL,ZESTRIL) 40 MG tablet Take 40 mg by mouth daily.      Historical Provider, MD  metFORMIN (GLUCOPHAGE) 1000 MG tablet Take 1,000 mg by mouth 2 (two) times daily.      Historical Provider, MD  nitroGLYCERIN (NITROSTAT) 0.4 MG SL tablet Place 0.4 mg under the tongue every 5 (five) minutes as needed.      Historical Provider, MD  omeprazole (PRILOSEC) 20 MG capsule Take 20 mg by mouth daily.  06/03/11   Historical Provider, MD  ranolazine (RANEXA) 500 MG 12 hr tablet Take 500 mg by mouth 2 (two) times daily.      Historical Provider, MD  simvastatin (ZOCOR) 40 MG tablet Take 20 mg by mouth at bedtime.     Historical Provider, MD  warfarin (COUMADIN) 7.5 MG tablet Take 7.5 mg by mouth as directed.    Historical Provider, MD    Social History:  reports that he quit smoking about 25 years ago. His smoking use included Cigarettes. He smoked 0.00 packs per day for 30 years. He has never used smokeless tobacco. He reports that  drinks alcohol. He reports that he does not use illicit drugs.  Family History  Problem Relation Age of Onset  . Heart disease Mother   . Diabetes Mother   . Cancer Brother     colon  . Heart disease Brother     heart attack  . Stroke Other   . Hypertension Other     Review of Systems:  Constitutional: Denies fever, chills, diaphoresis, appetite change  and fatigue.  HEENT: Denies photophobia, eye pain, redness, hearing loss, ear pain, congestion, sore throat, rhinorrhea, sneezing, mouth sores, trouble swallowing, neck pain, neck stiffness and tinnitus.   Respiratory: Denies SOB, DOE, cough, chest tightness,  and wheezing.   Cardiovascular: Denies chest pain, palpitations and leg swelling.  Gastrointestinal: Denies abdominal pain, diarrhea, constipation, blood in stool and abdominal distention.  Genitourinary: Denies dysuria, urgency, frequency, hematuria, flank pain and difficulty urinating.  Musculoskeletal: Denies myalgias, back pain, joint swelling, arthralgias and gait problem.  Skin: Denies pallor, rash and wound.  Neurological: Denies dizziness, seizures, syncope, weakness, light-headedness, numbness and headaches.  Hematological: Denies adenopathy. Easy bruising, personal or family bleeding history  Psychiatric/Behavioral: Denies suicidal ideation, mood changes, confusion, nervousness, sleep disturbance and agitation   Physical Exam: Blood pressure 136/76, pulse 79, resp. rate 18, SpO2 100.00%. Gen: Awake, oriented x 3, no current distress. HEENT: [place]/AT/PERRL/EOMI/moist mucous membranes, mild left facial droop. Neck: supple, no JVD, no LAD, no bruits, no goiter. CV: RRR, no M/R/G Lungs: CTA B Abd: S/NT/ND/+BS/no masses or organomegaly noted. Ext: no C/C/E, +pedal pulses. Neuro: MS intact, speech slurred, left facial droop, strength about 4/5 all extremities, mild right pronator drift, finger-nose slow but intact, proprioception intact, sensation intact to light touch.  Labs on Admission:  No results found for this or any previous visit (from the past 48 hour(s)).  Radiological Exams on Admission: No results found.  Assessment/Plan Principal Problem:   CVA (cerebral infarction) Active Problems:   CAD   VENTRICULAR TACHYCARDIA, PAROXYSMAL   Chronic systolic heart failure   AUTOMATIC IMPLANTABLE CARDIAC DEFIBRILLATOR SITU    Atrial fibrillation   DM type 1 (diabetes mellitus, type 1)   CKD (chronic kidney disease) stage 4, GFR 15-29 ml/min   Presumed CVA -Initial CT head reported as negative. -Cannot do MRI because of ICD. -Will repeat CT in 48 hours. -Check lipids. -Check A1c -ECHO/Carotid doppler -PT/OT/ST evals. -Despite his h/o a fib was taken off coumadin due to a severe life-threatening GI Bleed. -His cardiologist, Dr. Ladona Ridgel, has decided to keep him off coumadin. -Will continue ASA/Plavix for now. -Neurology has been consulted (Dr. Thad Ranger).  IDDM -Takes lantus 35 at home. -Will decrease to 15 while he is NPO. -A1C -SSI  CKD Stage IV -At baseline. -Already has HD access graft in place.  DVT Prophylaxis -Lovenox.   Time Spent on Admission: 80 minutes.  HERNANDEZ ACOSTA,ESTELA [facility] Pager:  [phone] 02/09/2013, 4:39 PM

## 2013-02-10 ENCOUNTER — Inpatient Hospital Stay (HOSPITAL_COMMUNITY): Payer: Medicare Other

## 2013-02-10 DIAGNOSIS — I6350 Cerebral infarction due to unspecified occlusion or stenosis of unspecified cerebral artery: Secondary | ICD-10-CM

## 2013-02-10 DIAGNOSIS — I472 Ventricular tachycardia: Secondary | ICD-10-CM

## 2013-02-10 LAB — LIPID PANEL
Cholesterol: 168 mg/dL (ref 0–200)
HDL: 35 mg/dL — ABNORMAL LOW (ref >39)
LDL Cholesterol: 103 mg/dL — ABNORMAL HIGH (ref 0–99)
Triglycerides: 150 mg/dL — ABNORMAL HIGH (ref <150)

## 2013-02-10 LAB — HEMOGLOBIN A1C
Hgb A1c MFr Bld: 10 % — ABNORMAL HIGH (ref <5.7)
Mean Plasma Glucose: 240 mg/dL — ABNORMAL HIGH (ref <117)

## 2013-02-10 LAB — GLUCOSE, CAPILLARY: Glucose-Capillary: 200 mg/dL — ABNORMAL HIGH (ref 70–99)

## 2013-02-10 MED ORDER — IOHEXOL 350 MG/ML SOLN
50.0000 mL | Freq: Once | INTRAVENOUS | Status: AC | PRN
  Administered 2013-02-10: 50 mL via INTRAVENOUS

## 2013-02-10 MED ORDER — SODIUM BICARBONATE BOLUS VIA INFUSION
INTRAVENOUS | Status: AC
  Administered 2013-02-10: 17:00:00 via INTRAVENOUS
  Filled 2013-02-10: qty 1000

## 2013-02-10 MED ORDER — SODIUM BICARBONATE 8.4 % IV SOLN
INTRAVENOUS | Status: AC
  Administered 2013-02-10: 18:00:00 via INTRAVENOUS
  Filled 2013-02-10 (×2): qty 500

## 2013-02-10 NOTE — Evaluation (Signed)
Occupational Therapy Evaluation Patient Details Name: Jason Walker MRN: [id number] DOB: 1930-03-01 Today's Date: 02/10/2013 Time: 4098-1191 OT Time Calculation (min): 26 min  OT Assessment / Plan / Recommendation Clinical Impression  Pt admitted from [facility].  Wife had found him unresponsive with L facial droop and slurred speech. Pt presents with lethargy. He is able to move all extremities, follow simple commands, and is oriented x 3.  Pt requires total assist for all mobility and ADL and demonstrates poor sitting balance.  Will follow acutely.  Pt is likely to need post acute rehab, although wife would prefer to take him home.    OT Assessment  Patient needs continued OT Services    Follow Up Recommendations  SNF    Barriers to Discharge Decreased caregiver support    Equipment Recommendations       Recommendations for Other Services    Frequency  Min 3X/week    Precautions / Restrictions Precautions Precautions: Fall Restrictions Weight Bearing Restrictions: No   Pertinent Vitals/Pain Denies pain.    ADL  Eating/Feeding: +1 Total assistance Where Assessed - Eating/Feeding: Bed level Grooming: +1 Total assistance Where Assessed - Grooming: Supported sitting Upper Body Bathing: +1 Total assistance Where Assessed - Upper Body Bathing: Supported sitting Lower Body Bathing: +1 Total assistance Where Assessed - Lower Body Bathing: Supported sitting Upper Body Dressing: +1 Total assistance Where Assessed - Upper Body Dressing: Supported sitting Lower Body Dressing: +1 Total assistance Where Assessed - Lower Body Dressing: Unsupported sitting Equipment Used: Gait belt Transfers/Ambulation Related to ADLs: stood and attempted side step to [facility] with +2 assist ADL Comments: Pt with lethargy, dependent in all ADL and mobility.  Nurse tech reports pt was more alert this morning, then become lethargic after vomiting.    OT Diagnosis: Generalized weakness  OT Problem  List: Decreased strength;Decreased activity tolerance;Impaired balance (sitting and/or standing);Decreased knowledge of use of DME or AE;Impaired UE functional use OT Treatment Interventions: Self-care/ADL training;Therapeutic activities;Patient/family education;Balance training   OT Goals Acute Rehab OT Goals OT Goal Formulation: Patient unable to participate in goal setting Time For Goal Achievement: 02/24/13 Potential to Achieve Goals: Good ADL Goals Pt Will Perform Eating: with supervision;Sitting, chair ADL Goal: Eating - Progress: Goal set today Pt Will Perform Grooming: with supervision;Sitting, edge of bed ADL Goal: Grooming - Progress: Goal set today Pt Will Transfer to Toilet: with mod assist;Stand pivot transfer;3-in-1 ADL Goal: Toilet Transfer - Progress: Goal set today Miscellaneous OT Goals Miscellaneous OT Goal #1: Pt will perform bed mobility with mod assist in preparation for ADL at EOB. OT Goal: Miscellaneous Goal #1 - Progress: Goal set today Miscellaneous OT Goal #2: Pt will sit EOB x 5 minutes with min guard assist in prep for ADL. OT Goal: Miscellaneous Goal #2 - Progress: Goal set today Miscellaneous OT Goal #3: Pt will remain alert x 5 minutes with eyes open while engaged in therapeutic activity. OT Goal: Miscellaneous Goal #3 - Progress: Goal set today  Visit Information  Last OT Received On: 02/10/13 Assistance Needed: +2 PT/OT Co-Evaluation/Treatment: Yes    Subjective Data  Subjective: "I'm in [place]."   Prior Functioning     Home Living Lives With: Spouse Available Help at Discharge: Family;Available PRN/intermittently Type of Home: House Home Access: Level entry Home Layout: One level Bathroom Shower/Tub: Walk-in shower;Tub/shower unit;Curtain Bathroom Toilet: Handicapped height Bathroom Accessibility: Yes How Accessible: Accessible via walker Home Adaptive Equipment: Built-in shower seat Prior Function Level of Independence:  Independent Able to Take Stairs?: Yes  Driving: Yes Vocation: Retired Musician:  (answered questions with brief response, but intelligible) Dominant Hand: Right         Vision/Perception Vision - Assessment Vision Assessment:  (Pt able to follow finger to track, state number of fingers) Additional Comments: Pt kept eyes closed majority of session.   Cognition  Cognition Overall Cognitive Status: Difficult to assess Difficult to assess due to: Level of arousal Arousal/Alertness: Lethargic Orientation Level: Appears intact for tasks assessed Behavior During Session: Lethargic Cognition - Other Comments: Pt able to follow one step commands.  Confused right and left.    Extremity/Trunk Assessment Right Upper Extremity Assessment RUE ROM/Strength/Tone: Unable to fully assess (grossly 4/5, low endurance) RUE Sensation:  (intact temperature and gross touch) Left Upper Extremity Assessment LUE ROM/Strength/Tone: Unable to fully assess (grossly 4/5, low endurance) LUE Sensation:  (intact temperature and gross touch) Trunk Assessment Trunk Assessment: Normal     Mobility Bed Mobility Bed Mobility: Rolling Left;Left Sidelying to Sit;Sitting - Scoot to [place] of Bed;Sit to Sidelying Left Rolling Left: 1: +2 Total assist Rolling Left: Patient Percentage: 10% Left Sidelying to Sit: 1: +2 Total assist Left Sidelying to Sit: Patient Percentage: 10% Sitting - Scoot to Edge of Bed: 1: +1 Total assist Sit to Sidelying Left: 1: +2 Total assist Sit to Sidelying Left: Patient Percentage: 10% Details for Bed Mobility Assistance: Pt very lethargic. Transfers Transfers: Sit to Stand;Stand to Sit Sit to Stand: 1: +2 Total assist;From bed Sit to Stand: Patient Percentage: 10% Stand to Sit: 1: +2 Total assist;To bed Stand to Sit: Patient Percentage: 10%     Exercise     Balance Balance Balance Assessed: Yes Static Sitting Balance Static Sitting - Balance Support: Feet  supported;Right upper extremity supported;Left upper extremity supported Static Sitting - Level of Assistance: 3: Mod assist (with posterior and right side lean) Static Sitting - Comment/# of Minutes: 5   End of Session OT - End of Session Activity Tolerance: Patient limited by fatigue Patient left: in bed;with bed alarm set Nurse Communication: Mobility status  GO     Evern Bio 02/10/2013, 9:55 AM [phone]

## 2013-02-10 NOTE — Progress Notes (Signed)
  Echocardiogram 2D Echocardiogram has been performed.  Cathie Beams 02/10/2013, 10:32 AM

## 2013-02-10 NOTE — Consult Note (Addendum)
Referring Physician: Ardyth Harps    Chief Complaint: stroke  HPI:                                                                                                                                         Jason Walker is an 79 y.o. male with known A-fib but not on AC due to previous life threating GI bleed, PVT s/p ICD, CKD.  Patient presented to [facility] today 02-09-13 [facility] of [facility]y coverage at [facility]. Patient was last seen normal at about 0100 am 02-09-13  when he went to bed. Wife found him the next morning about 6:15 am unresponsive and S/P vomiting vomiting. She also noted areas in room where he had previously gotten up in the night and been to kitchen.  She was awoken at 6 by her husband when he tried to get out of the bed and feel.  HE also vomited. Per family yesterday he had multiple episodes where he became diaphoretic and nonresponsive. Initial CT at [facility] was negative.     Date last known well: 02-08-13 Time last known well: 10 pm tPA Given: No: out of window  Past Medical History  Diagnosis Date  . RBBB (right bundle branch block)   . AAA (abdominal aortic aneurysm)   . AMI (acute myocardial infarction)   . Diabetes mellitus   . Coronary artery disease   . Pacemaker   . Defibrillator activation   . Hypertension   . Heart disease   . Heart attack   . Wears dentures   . Hyperlipidemia   . Shortness of breath   . Kidney disease   . Hyperpotassemia   . Edema     Past Surgical History  Procedure Laterality Date  . Coronary artery bypass graft  1990  . Icd  2011  . Hernia repair      umbilical and RIH  . Aneurysm coiling    . Rotator cuff repair      bilateral    Family History  Problem Relation Age of Onset  . Heart disease Mother   . Diabetes Mother   . Cancer Brother     colon  . Heart disease Brother     heart attack  . Stroke Other   . Hypertension Other    Social History:  reports that he quit smoking  about 25 years ago. His smoking use included Cigarettes. He smoked 0.00 packs per day for 30 years. He has never used smokeless tobacco. He reports that  drinks alcohol. He reports that he does not use illicit drugs.  Allergies:  Allergies  Allergen Reactions  . Lorazepam   . Morphine And Related     Medications:  Prior to Admission:  Prescriptions prior to admission  Medication Sig Dispense Refill  . aspirin 81 MG tablet Take 81 mg by mouth daily.      . carvedilol (COREG) 12.5 MG tablet Take 1 tablet (12.5 mg total) by mouth 2 (two) times daily.  180 tablet  3  . cholecalciferol (VITAMIN D) 1000 UNITS tablet Take 1,000 Units by mouth daily.      . clopidogrel (PLAVIX) 75 MG tablet Take 75 mg by mouth daily.      Marland Kitchen doxazosin (CARDURA) 4 MG tablet Take 4 mg by mouth at bedtime.        . furosemide (LASIX) 40 MG tablet Take 1 tablet (40 mg total) by mouth 2 (two) times daily.  30 tablet    . insulin glargine (LANTUS) 100 UNIT/ML injection Inject 35 Units into the skin at bedtime.        Marland Kitchen lisinopril (PRINIVIL,ZESTRIL) 40 MG tablet Take 40 mg by mouth daily.        . metFORMIN (GLUCOPHAGE) 1000 MG tablet Take 1,000 mg by mouth 2 (two) times daily.        . nitroGLYCERIN (NITROSTAT) 0.4 MG SL tablet Place 0.4 mg under the tongue every 5 (five) minutes as needed.        Marland Kitchen omeprazole (PRILOSEC) 20 MG capsule Take 20 mg by mouth daily.       . ranolazine (RANEXA) 500 MG 12 hr tablet Take 500 mg by mouth 2 (two) times daily.        . simvastatin (ZOCOR) 40 MG tablet Take 20 mg by mouth at bedtime.        Scheduled: . aspirin EC  81 mg Oral Daily  . carvedilol  12.5 mg Oral BID WC  . clopidogrel  75 mg Oral Q breakfast  . doxazosin  4 mg Oral QHS  . enoxaparin (LOVENOX) injection  40 mg Subcutaneous Q24H  . furosemide  40 mg Oral BID  . insulin aspart  0-9 Units  Subcutaneous TID WC  . insulin aspart  3 Units Subcutaneous TID WC  . insulin glargine  15 Units Subcutaneous QHS  . pantoprazole  40 mg Oral Daily  . promethazine  12.5 mg Intravenous Once  . ranolazine  500 mg Oral BID  . simvastatin  20 mg Oral QHS  . sodium chloride  3 mL Intravenous Q12H    ROS:                                                                                                                                       History obtained from the patient  General ROS: negative for - chills, fatigue, fever, night sweats, weight gain or weight loss Psychological ROS: negative for - behavioral disorder, hallucinations, memory difficulties, mood swings or suicidal ideation Ophthalmic ROS: negative for - blurry vision, double vision, eye pain or loss of vision ENT ROS: negative  for - epistaxis, nasal discharge, oral lesions, sore throat, tinnitus or vertigo Allergy and Immunology ROS: negative for - hives or itchy/watery eyes Hematological and Lymphatic ROS: negative for - bleeding problems, bruising or swollen lymph nodes Endocrine ROS: negative for - galactorrhea, hair pattern changes, polydipsia/polyuria or temperature intolerance Respiratory ROS: negative for - cough, hemoptysis, shortness of breath or wheezing Cardiovascular ROS: negative for - chest pain, dyspnea on exertion, edema or irregular heartbeat Gastrointestinal ROS: negative for - abdominal pain, diarrhea, hematemesis, nausea/vomiting or stool incontinence Genito-Urinary ROS: negative for - dysuria, hematuria, incontinence or urinary frequency/urgency Musculoskeletal ROS: negative for - joint swelling or muscular weakness Neurological ROS: as noted in HPI Dermatological ROS: negative for rash and skin lesion changes  Neurologic Examination:                                                                                                      Blood pressure 134/63, pulse 70, temperature 97.9 F (36.6 C), temperature  source Axillary, resp. rate 20, SpO2 100.00%.  Mental Status: Drowsy, awakens to external stimuli but will fall asleep easily. Wife states he has been like this since hospital admission .   Speech minimal due to drowsiness--oriented to hopital.  Able to follow simple step commands with difficulty due to lethargy. Cranial Nerves: II: Discs flat bilaterally; Visual fields grossly normal, pupils equal, round, reactive to light and accommodation III,IV, VI: ptosis not present, extra-ocular motions intact bilaterally V,VII: smile asymmetric on the right, facial light touch sensation normal bilaterally VIII: hearing normal bilaterally IX,X: gag reflex present XI: bilateral shoulder shrug XII: midline tongue extension Motor: Right : Upper extremity   4/5    Left:     Upper extremity   5/5  Lower extremity   4/5     Lower extremity   5/5 --drift on both right UE and LE Tone and bulk:normal tone throughout; no atrophy noted Sensory: Pinprick and light touch intact throughout, bilaterally Deep Tendon Reflexes: 2+ UE no KJ or AJ (known Diabetic neuropathy) Plantars: Right: downgoing   Left: downgoing Cerebellar: normal finger-to-nose,  normal heel-to-shin test CV: pulses palpable throughout     Results for orders placed during the hospital encounter of 02/09/13 (from the past 48 hour(s))  GLUCOSE, CAPILLARY     Status: Abnormal   Collection Time    02/09/13  4:39 PM      Result Value Range   Glucose-Capillary 157 (*) 70 - 99 mg/dL  CBC     Status: Abnormal   Collection Time    02/09/13  4:45 PM      Result Value Range   WBC 12.1 (*) 4.0 - 10.5 K/uL   RBC 4.28  4.22 - 5.81 MIL/uL   Hemoglobin 12.7 (*) 13.0 - 17.0 g/dL   HCT 16.1 (*) 09.6 - 04.5 %   MCV 87.1  78.0 - 100.0 fL   MCH 29.7  26.0 - 34.0 pg   MCHC 34.0  30.0 - 36.0 g/dL   RDW 40.9 (*) 81.1 - 91.4 %   Platelets 189  150 - 400  K/uL  BASIC METABOLIC PANEL     Status: Abnormal   Collection Time    02/09/13  4:57 PM       Result Value Range   Sodium 135  135 - 145 mEq/L   Potassium 4.1  3.5 - 5.1 mEq/L   Chloride 96  96 - 112 mEq/L   CO2 28  19 - 32 mEq/L   Glucose, Bld 172 (*) 70 - 99 mg/dL   BUN 54 (*) 6 - 23 mg/dL   Creatinine, Ser 1.61 (*) 0.50 - 1.35 mg/dL   Calcium 8.9  8.4 - 09.6 mg/dL   GFR calc non Af Amer 22 (*) >90 mL/min   GFR calc Af Amer 26 (*) >90 mL/min   Comment:            The eGFR has been calculated     using the CKD EPI equation.     This calculation has not been     validated in all clinical     situations.     eGFR's persistently     <90 mL/min signify     possible Chronic Kidney Disease.  GLUCOSE, CAPILLARY     Status: Abnormal   Collection Time    02/09/13  7:09 PM      Result Value Range   Glucose-Capillary 150 (*) 70 - 99 mg/dL  AMYLASE     Status: Abnormal   Collection Time    02/09/13  8:03 PM      Result Value Range   Amylase 106 (*) 0 - 105 U/L  LIPASE, BLOOD     Status: None   Collection Time    02/09/13  8:03 PM      Result Value Range   Lipase 33  11 - 59 U/L  HEPATIC FUNCTION PANEL     Status: None   Collection Time    02/09/13  8:03 PM      Result Value Range   Total Protein 8.3  6.0 - 8.3 g/dL   Albumin 3.9  3.5 - 5.2 g/dL   AST 22  0 - 37 U/L   ALT 14  0 - 53 U/L   Alkaline Phosphatase 66  39 - 117 U/L   Total Bilirubin 0.5  0.3 - 1.2 mg/dL   Bilirubin, Direct 0.1  0.0 - 0.3 mg/dL   Indirect Bilirubin 0.4  0.3 - 0.9 mg/dL  GLUCOSE, CAPILLARY     Status: Abnormal   Collection Time    02/09/13 10:33 PM      Result Value Range   Glucose-Capillary 137 (*) 70 - 99 mg/dL   Comment 1 Documented in Chart     Comment 2 Notify RN    LIPID PANEL     Status: Abnormal   Collection Time    02/10/13  5:00 AM      Result Value Range   Cholesterol 168  0 - 200 mg/dL   Triglycerides 045 (*) <150 mg/dL   HDL 35 (*) >40 mg/dL   Total CHOL/HDL Ratio 4.8     VLDL 30  0 - 40 mg/dL   LDL Cholesterol 981 (*) 0 - 99 mg/dL   Comment:            Total  Cholesterol/HDL:CHD Risk     Coronary Heart Disease Risk Table                         Men   Women  1/2 Average Risk   3.4   3.3      Average Risk       5.0   4.4      2 X Average Risk   9.6   7.1      3 X Average Risk  23.4   11.0                Use the calculated Patient Ratio     above and the CHD Risk Table     to determine the patient's CHD Risk.                ATP III CLASSIFICATION (LDL):      <100     mg/dL   Optimal      161-096  mg/dL   Near or Above                        Optimal      130-159  mg/dL   Borderline      045-409  mg/dL   High      >811     mg/dL   Very High  GLUCOSE, CAPILLARY     Status: Abnormal   Collection Time    02/10/13  6:40 AM      Result Value Range   Glucose-Capillary 184 (*) 70 - 99 mg/dL   Comment 1 Documented in Chart     Comment 2 Notify RN     Dg Chest 2 View  02/10/2013  *RADIOLOGY REPORT*  Clinical Data: Cough and vomiting.  CHEST - 2 VIEW  Comparison: 04/06/2010  Findings: Stable appearance of mediastinal postoperative changes and cardiac pacemakers since previous study.  Shallow inspiration. Mild cardiac enlargement with mild pulmonary vascular congestion. No edema.  No focal consolidation or airspace disease.  No blunting of costophrenic angles.  Calcified granulomas on the right.  No pneumothorax.  Calcified and tortuous aorta.  Postoperative changes in the cervical spine and right shoulder.  IMPRESSION: Shallow inspiration with mild congestive changes.  No edema or consolidation.   Original Report Authenticated By: Burman Nieves, M.D.    US Abdomen Complete  02/10/2013  *RADIOLOGY REPORT*  Clinical Data:  Leukocytosis.  Elevated amylase.  Renal insufficiency with creatinine 2.53.  COMPLETE ABDOMINAL ULTRASOUND  Comparison:  None.  Findings:  Gallbladder:  Approximate 7 mm shadowing gallstone in the neck of the gallbladder which did not move with changes in patient positioning.  No gallbladder wall thickening or pericholecystic fluid.   Negative sonographic Murphy's sign according to the ultrasound technologist.  Common bile duct:  Normal in caliber with maximum diameter approximating 5 mm.  The distal duct was obscured by duodenal bowel gas.  Liver:  Normal size and echotexture without focal parenchymal abnormality.  Patent portal vein with hepatopetal flow.  IVC:  Patent.  Pancreas:  Although the pancreas is difficult to visualize in its entirety, no focal pancreatic abnormality is identified.  The distal body and tail were obscured by overlying bowel gas.  Spleen:  Normal size and echotexture without focal parenchymal abnormality.  Right Kidney:  No hydronephrosis.  Well-preserved cortex.  Normal parenchymal echotexture.  Approximate 3 cm simple cyst arising from the upper pole.  No significant focal parenchymal abnormality. Approximately 12.2 cm in length.  Left Kidney:  No hydronephrosis.  Severe diffuse cortical thinning with echogenic parenchyma.  No focal parenchymal abnormality. Approximately 10.1 cm in length.  Abdominal aorta:  Only visualized in its midportion  due to bowel gas, normal in caliber measuring 2.2 cm.  IMPRESSION:  1.  7 mm gallstone which is impacted in the gallbladder neck.  No sonographic evidence of acute cholecystitis. 2.  No biliary ductal dilation. 3.  Normal-appearing pancreatic head and proximal body.  The distal body and tail were obscured by overlying bowel gas. 3.  Atrophic left kidney.  3 cm simple cyst in the upper pole of the otherwise normal-appearing right kidney.   Original Report Authenticated By: Hulan Saas, M.D.    Assessment and plan discussed with with attending physician and they are in agreement.    Felicie Morn PA-C [facility] [phone]  02/10/2013, 9:04 AM    Patient seen and examined.  Clinical course and management discussed.  Necessary edits performed.  I agree with the above.  Assessment and plan of care developed and discussed below.    Assessment: 79 y.o. male  presenting with acute onset nausea, vomiting accompanying episodes of unresponsive.  At this time the patient is lethargic but easily arousable and continues to have complaints of nausea and vomiting.  Initial head CT is unremarkable.  MR is unable to be performed because of his pacemaker and renal function precludes a CTA.  Although there may be other causes for his current presentation would consider infarct as a possibility as well (particularly considering multiple risk factors) and proceed with work up.  Patient not a Coumadin candidate.  On maximum antiplatelet therapy.    Stroke Risk Factors - atrial fibrillation, diabetes mellitus, hyperlipidemia and hypertension  Plan: 1. HgbA1c, fasting lipid panel 2. PT consult, OT consult, Speech consult as tolerated 3. Echocardiogram 4. Carotid dopplers 5. Prophylactic therapy-Continue ASA and Plavix 6. Risk factor modification 7. Telemetry monitoring 8. Frequent neuro checks 9. EEG  Case discussed with Dr. Corliss Skains and will obtain a CTA today to evaluate posterior circulation.  Thana Farr, MD [facility] [phone]  02/10/2013  2:47 PM

## 2013-02-10 NOTE — Evaluation (Signed)
Physical Therapy Evaluation Patient Details Name: Jason Walker MRN: [id number] DOB: [birth year] Today's Date: 02/10/2013 Time: 4098-1191 PT Time Calculation (min): 37 min  PT Assessment / Plan / Recommendation Clinical Impression  Pt is an 79 yo male admiited for questionable CVA. Pt with severe letheragy this date limited accuracy of eval however appears to ahve R sided weakness, in addition to noted balance impairment and inability to achieve safe transfers without assist of 2 people. Pt to benefit from SNF upon d/c to achieve maximal functional recovery/mod I function for safe return home due to patient alone during the day.    PT Assessment  Patient needs continued PT services    Follow Up Recommendations  SNF;Supervision/Assistance - 24 hour    Does the patient have the potential to tolerate intense rehabilitation      Barriers to Discharge Decreased caregiver support (wife works during the day)      Equipment Recommendations   (TBD)    Recommendations for Other Services     Frequency Min 4X/week    Precautions / Restrictions Precautions Precautions: Fall Restrictions Weight Bearing Restrictions: No   Pertinent Vitals/Pain Pt shook head no to having pain      Mobility  Bed Mobility Bed Mobility: Rolling Left;Left Sidelying to Sit;Sitting - Scoot to [place] of Bed;Sit to Sidelying Left Rolling Left: 1: +2 Total assist Rolling Left: Patient Percentage: 10% Left Sidelying to Sit: 1: +2 Total assist Left Sidelying to Sit: Patient Percentage: 10% Sitting - Scoot to Edge of Bed: 1: +1 Total assist Sit to Sidelying Left: 1: +2 Total assist Sit to Sidelying Left: Patient Percentage: 10% Details for Bed Mobility Assistance: Pt very lethargic. Transfers Transfers: Sit to Stand;Stand to Sit Sit to Stand: 1: +2 Total assist;From bed Sit to Stand: Patient Percentage: 10% Stand to Sit: 1: +2 Total assist;To bed Stand to Sit: Patient Percentage: 10% Details for Transfer  Assistance: pt required +2 to maintain standing, pt with wide base of support and bilat kneed locked in ext. Ambulation/Gait Ambulation/Gait Assistance: Not tested (comment) Wheelchair Mobility Wheelchair Mobility: No Modified Rankin (Stroke Patients Only) Pre-Morbid Rankin Score: No symptoms Modified Rankin: Severe disability    Exercises     PT Diagnosis: Difficulty walking;Generalized weakness  PT Problem List: Decreased strength;Decreased range of motion;Decreased activity tolerance;Decreased balance;Decreased mobility;Decreased coordination;Decreased cognition PT Treatment Interventions: Gait training;Functional mobility training;Therapeutic activities;Therapeutic exercise;Balance training;Neuromuscular re-education   PT Goals Acute Rehab PT Goals PT Goal Formulation: Patient unable to participate in goal setting Time For Goal Achievement: 02/24/13 Potential to Achieve Goals: Good Pt will go Supine/Side to Sit: with min assist;with HOB 0 degrees;with rail PT Goal: Supine/Side to Sit - Progress: Goal set today Pt will go Sit to Supine/Side: with min assist;with HOB 0 degrees PT Goal: Sit to Supine/Side - Progress: Goal set today Pt will go Sit to Stand: with mod assist;with upper extremity assist (up to RW) PT Goal: Sit to Stand - Progress: Goal set today Pt will Transfer Bed to Chair/Chair to Bed: with mod assist PT Transfer Goal: Bed to Chair/Chair to Bed - Progress: Goal set today Pt will Ambulate: 16 - 50 feet;with mod assist;with rolling walker PT Goal: Ambulate - Progress: Goal set today  Visit Information  Last PT Received On: 02/10/13 Assistance Needed: +2 PT/OT Co-Evaluation/Treatment: Yes    Subjective Data  Subjective: Pt received supine in bed with lethargy and minimal eye opening   Prior Functioning  Home Living Lives With: Spouse Available Help at Discharge: Family;Available PRN/intermittently Type  of Home: House Home Access: Level entry Home Layout: One  level Bathroom Shower/Tub: Walk-in shower;Tub/shower unit;Curtain Bathroom Toilet: Handicapped height Bathroom Accessibility: Yes How Accessible: Accessible via walker Home Adaptive Equipment: Built-in shower seat Prior Function Level of Independence: Independent Able to Take Stairs?: Yes Driving: Yes Vocation: Retired Musician:  (answered questions with brief response, but intelligible) Dominant Hand: Right    Cognition  Cognition Overall Cognitive Status: Difficult to assess Difficult to assess due to: Level of arousal Arousal/Alertness: Lethargic Orientation Level: Appears intact for tasks assessed Behavior During Session: Lethargic Cognition - Other Comments: Pt able to follow one step commands.  Confused right and left.    Extremity/Trunk Assessment Right Upper Extremity Assessment RUE ROM/Strength/Tone: Unable to fully assess (grossly 4/5, low endurance) RUE Sensation:  (intact temperature and gross touch) Left Upper Extremity Assessment LUE ROM/Strength/Tone: Unable to fully assess (grossly 4/5, low endurance) LUE Sensation:  (intact temperature and gross touch) Right Lower Extremity Assessment RLE ROM/Strength/Tone: Unable to fully assess;Deficits (generalized weakness) RLE ROM/Strength/Tone Deficits: limited by lethergy but appers to have weakness compared to L RLE Sensation: WFL - Light Touch RLE Coordination: Deficits RLE Coordination Deficits: most likely due to lethargy Left Lower Extremity Assessment LLE ROM/Strength/Tone: Unable to fully assess (due to lethargy, did move voluntarily for OOB transfer) LLE Sensation: WFL - Light Touch Trunk Assessment Trunk Assessment: Normal   Balance Balance Balance Assessed: Yes Static Sitting Balance Static Sitting - Balance Support: Feet supported;Right upper extremity supported;Left upper extremity supported Static Sitting - Level of Assistance: 3: Mod assist (with posterior and right side  lean) Static Sitting - Comment/# of Minutes: 5 Static Standing Balance Static Standing - Balance Support: Bilateral upper extremity supported Static Standing - Level of Assistance: 1: +2 Total assist Static Standing - Comment/# of Minutes: pt unable to tolerate standing, bilat LE locking in ext, unable to achieve full upright posture, wide base of support. no eye opening, no voluntary assist for transfer  End of Session PT - End of Session Equipment Utilized During Treatment: Gait belt Activity Tolerance: Patient limited by fatigue Patient left: in bed;with call bell/phone within reach;with bed alarm set Nurse Communication: Mobility status  GP     Marcene Brawn 02/10/2013, 11:41 AM  Lewis Shock, PT, DPT Pager #: [phone] Office #: [phone]

## 2013-02-10 NOTE — Progress Notes (Signed)
VASCULAR LAB PRELIMINARY  PRELIMINARY  PRELIMINARY  PRELIMINARY  Carotid duplex  completed.    Preliminary report:  Bilateral:  No evidence of hemodynamically significant internal carotid artery stenosis.  Right vertebral artery flow is to-fro. Left vertebral artery flow demonstrates the bunny sign.   Deyja Sochacki, RVT 02/10/2013, 10:14 AM

## 2013-02-10 NOTE — Evaluation (Signed)
Speech Language Pathology Evaluation Patient Details Name: Jason Walker MRN: [id number] DOB: 1930-03-25 Today's Date: 02/10/2013 Time: 2956-2130 SLP Time Calculation (min): 14 min  Problem List:  Patient Active Problem List  Diagnosis  . DM  . AMI  . CAD  . RBBB  . VENTRICULAR TACHYCARDIA, PAROXYSMAL  . Chronic systolic heart failure  . AAA  . AUTOMATIC IMPLANTABLE CARDIAC DEFIBRILLATOR SITU  . Atrial fibrillation  . CVA (cerebral infarction)  . DM type 1 (diabetes mellitus, type 1)  . CKD (chronic kidney disease) stage 4, GFR 15-29 ml/min   Past Medical History:  Past Medical History  Diagnosis Date  . RBBB (right bundle branch block)   . AAA (abdominal aortic aneurysm)   . AMI (acute myocardial infarction)   . Diabetes mellitus   . Coronary artery disease   . Pacemaker   . Defibrillator activation   . Hypertension   . Heart disease   . Heart attack   . Wears dentures   . Hyperlipidemia   . Shortness of breath   . Kidney disease   . Hyperpotassemia   . Edema    Past Surgical History:  Past Surgical History  Procedure Laterality Date  . Coronary artery bypass graft  1990  . Icd  2011  . Hernia repair      umbilical and RIH  . Aneurysm coiling    . Rotator cuff repair      bilateral   HPI:  79 y.o. male presenting with acute onset nausea, vomiting accompanying episodes of unresponsive. Initial head CT negative. CVA work up in progress.    Assessment / Plan / Recommendation Clinical Impression  Evaluation limited due to lethargy and c/o headache (RN aware and provided pain meds prior to arrival). Patient presents with a moderate dysarthria which may be exacerbated by lethargy at this time. No aphasia noted. Patient able to follow simple commands but required max assist for sustained level of alertness. SLP will continue to f/u in the acute care setting for dysarthria treatment and differential diagnosis of cognitive-linguistic skills to determine needs after  d/c.     SLP Assessment  Patient needs continued Speech Lanaguage Pathology Services    Follow Up Recommendations  Inpatient Rehab    Frequency and Duration min 2x/week  2 weeks   Pertinent Vitals/Pain Headache (RN aware and provided pain meds prior to eval)   SLP Goals  SLP Goals Potential to Achieve Goals: Good Progress/Goals/Alternative treatment plan discussed with pt/caregiver and they: Agree SLP Goal #1: Patient will sustain alert state with min verbal cues to complete diagnostic treatment of higher level language and cognitive function.  SLP Goal #1 - Progress: Not met SLP Goal #2: Patient will utilize speech intelligibility strategies with moderate cues at the conversation level.  SLP Goal #2 - Progress: Not met  SLP Evaluation Prior Functioning  Cognitive/Linguistic Baseline: Within functional limits Type of Home: House Lives With: Spouse Available Help at Discharge: Family;Available PRN/intermittently Vocation: Retired   IT consultant  Overall Cognitive Status: Impaired Arousal/Alertness: Lethargic Orientation Level: Oriented to person;Oriented to place;Oriented to situation (min assist for date) Attention: Focused Focused Attention: Impaired Focused Attention Impairment: Verbal basic;Functional basic (due to lethargy?) Memory:  (needs continued dx; recognizes family members) Comments: needs continued differential diagnosis due to lethargy    Comprehension  Auditory Comprehension Overall Auditory Comprehension: Appears within functional limits for tasks assessed (for basic 1-step commands) Visual Recognition/Discrimination Discrimination: Not tested Reading Comprehension Reading Status: Not tested    Expression Expression Primary  Mode of Expression: Verbal Verbal Expression Overall Verbal Expression: Appears within functional limits for tasks assessed (no aphasia noted) Written Expression Dominant Hand: Right Written Expression: Not tested   Oral / Motor  Oral Motor/Sensory Function Overall Oral Motor/Sensory Function:  (fomal OME to be completed) Motor Speech Overall Motor Speech: Impaired Respiration: Within functional limits Phonation: Low vocal intensity Resonance: Within functional limits Articulation: Impaired Level of Impairment: Word Intelligibility: Intelligibility reduced Word: 0-24% accurate Phrase: 0-24% accurate Sentence: 0-24% accurate Conversation: 0-24% accurate   GO   [facility] MA, CCC-SLP [phone]   Jason Walker 02/10/2013, 4:06 PM

## 2013-02-11 ENCOUNTER — Inpatient Hospital Stay (HOSPITAL_COMMUNITY): Payer: Medicare Other

## 2013-02-11 ENCOUNTER — Encounter (HOSPITAL_COMMUNITY): Payer: Self-pay | Admitting: Radiology

## 2013-02-11 DIAGNOSIS — I6350 Cerebral infarction due to unspecified occlusion or stenosis of unspecified cerebral artery: Secondary | ICD-10-CM

## 2013-02-11 LAB — GLUCOSE, CAPILLARY
Glucose-Capillary: 181 mg/dL — ABNORMAL HIGH (ref 70–99)
Glucose-Capillary: 191 mg/dL — ABNORMAL HIGH (ref 70–99)

## 2013-02-11 LAB — BASIC METABOLIC PANEL
BUN: 50 mg/dL — ABNORMAL HIGH (ref 6–23)
Chloride: 96 mEq/L (ref 96–112)
Glucose, Bld: 188 mg/dL — ABNORMAL HIGH (ref 70–99)
Potassium: 4.3 mEq/L (ref 3.5–5.1)
Sodium: 136 mEq/L (ref 135–145)

## 2013-02-11 MED ORDER — INSULIN GLARGINE 100 UNIT/ML ~~LOC~~ SOLN
20.0000 [IU] | Freq: Every day | SUBCUTANEOUS | Status: DC
  Administered 2013-02-11 – 2013-02-13 (×3): 20 [IU] via SUBCUTANEOUS
  Filled 2013-02-11 (×4): qty 0.2

## 2013-02-11 MED ORDER — CLOPIDOGREL BISULFATE 75 MG PO TABS: 75.0000 mg | ORAL_TABLET | Freq: Every day | ORAL | Status: DC

## 2013-02-11 MED ORDER — VALPROATE SODIUM 500 MG/5ML IV SOLN
1000.0000 mg | Freq: Once | INTRAVENOUS | Status: AC
  Administered 2013-02-11: 1000 mg via INTRAVENOUS
  Filled 2013-02-11: qty 10

## 2013-02-11 MED ORDER — DIVALPROEX SODIUM ER 500 MG PO TB24
500.0000 mg | ORAL_TABLET | Freq: Every day | ORAL | Status: DC
  Administered 2013-02-12 – 2013-02-14 (×3): 500 mg via ORAL
  Filled 2013-02-11 (×3): qty 1

## 2013-02-11 NOTE — Progress Notes (Signed)
[facility]             Progress Note   Subjective: Discussed extensively with patient and family at bedside. Going to ICU today per neuro recs. Continues to be nauseous and dizzy. Says that he sees people that are standing horizontal instead of vertical. Also has a severe HA.  Objective: Vital signs in last 24 hours: Temp:  [97.2 F (36.2 C)-99 F (37.2 C)] 97.2 F (36.2 C) (03/18 0932) Pulse Rate:  [63-75] 68 (03/18 0932) Resp:  [15-21] 20 (03/18 0932) BP: (108-158)/(60-93) 126/60 mmHg (03/18 0932) SpO2:  [98 %-100 %] 100 % (03/18 0932) Weight:  [87.4 kg (192 lb 10.9 oz)] 87.4 kg (192 lb 10.9 oz) (03/18 0100) Weight change:  Last BM Date: 02/08/13  Intake/Output from previous day: 03/17 0701 - 03/18 0700 In: 480 [P.O.:480] Out: 525 [Urine:225; Emesis/NG output:300]     Physical Exam: General: Alert, awake, oriented x3. HEENT: No bruits, no goiter. Heart: Regular rate and rhythm, without murmurs, rubs, gallops. Lungs: Clear to auscultation bilaterally. Abdomen: Soft, nontender, nondistended, positive bowel sounds. Extremities: No clubbing cyanosis or edema with positive pedal pulses.     Lab Results: Basic Metabolic Panel:  Recent Labs  16/10/96 1657 02/11/13 0530  NA 135 136  K 4.1 4.3  CL 96 96  CO2 28 27  GLUCOSE 172* 188*  BUN 54* 50*  CREATININE 2.53* 2.39*  CALCIUM 8.9 8.8   Liver Function Tests:  Recent Labs  02/09/13 2003  AST 22  ALT 14  ALKPHOS 66  BILITOT 0.5  PROT 8.3  ALBUMIN 3.9    Recent Labs  02/09/13 2003  LIPASE 33  AMYLASE 106*   CBC:  Recent Labs  02/09/13 1645  WBC 12.1*  HGB 12.7*  HCT 37.3*  MCV 87.1  PLT 189   CBG:  Recent Labs  02/10/13 0640 02/10/13 1136 02/10/13 1911 02/10/13 2142 02/11/13 0743 02/11/13 1135  GLUCAP 184* 164* 209* 200* 181* 191*   Hemoglobin A1C:  Recent Labs  02/10/13 0500  HGBA1C 10.0*   Fasting Lipid Panel:  Recent Labs  02/10/13 0500  CHOL 168   HDL 35*  LDLCALC 103*  TRIG 150*  CHOLHDL 4.8    Studies/Results: Ct Angio Head W/cm &/or Wo Cm  02/10/2013  **ADDENDUM** CREATED: 02/10/2013 18:35:38  Critical Value/emergent results were called by telephone at the time of interpretation on 02/10/2013 at 6:35 p.m. to Dr. Thad Ranger ., who verbally acknowledged these results. **END ADDENDUM**   02/10/2013  *RADIOLOGY REPORT*  Clinical Data:  Vomiting.  Unresponsive.  Evaluate posterior circulation.  CT ANGIOGRAPHY HEAD  Technique:  Multidetector CT imaging of the head was performed using the standard protocol during bolus administration of intravenous contrast.  Multiplanar CT image reconstructions including MIPs were obtained to evaluate the vascular anatomy.  Contrast: 50mL OMNIPAQUE IOHEXOL 350 MG/ML SOLN  Comparison:   None.  Findings:  Moderate to large left posterior fossa infarct involving a majority of the left cerebellum sparing the superior aspect. Presently no associated hemorrhage however, there is associated mass effect and the patient may be at risk for development of hydrocephalus if there is further progression of edema.  Remote infarcts frontal lobes, basal ganglia and left thalamus. Small vessel disease type changes.  Global atrophy.  Ventricular prominence probably related to atrophy but will need to be monitored.  No intracranial hemorrhage.  No intracranial enhancing lesion.  The left vertebral artery is the dominant vertebral artery.  Mild narrowing of the left  vertebral artery with ectasia.  Right vertebral artery is small after the takeoff of the right PICA.  Basilar artery is ectatic, slightly small and irregular although not occluded or without focal high-grade stenosis.  Portions of the PICAs are noted bilaterally.  Portions of the AICA visualized bilaterally. Left AICA only noted proximally with poor delineation of the distal branches.  Atherosclerotic type changes with mild to moderate narrowing cavernous segment of the internal  carotid artery bilaterally. Fetal type origin posterior cerebral artery bilaterally.  No high-grade stenosis of the M1 segment or A1 segment of the middle cerebral artery or anterior cerebral artery on either side.  Middle cerebral artery branch vessel irregularity bilaterally.  No aneurysm or vascular malformation noted.   Review of the MIP images confirms the above findings.  IMPRESSION: Moderate to large left cerebellar infarct with local mass effect. Presently no associated hemorrhage or hydrocephalus currently.  Although there is narrowing of portions of the vertebral arteries and basilar artery, no high-grade stenosis or thrombus identified.  Portions of the PICAs are noted bilaterally.  Portions of the AICA visualized bilaterally. Left AICA only noted proximally with poor delineation of the distal branches.  Mild to moderate narrowing of the cavernous segment of the internal carotid artery bilaterally.   Original Report Authenticated By: Lacy Duverney, M.D.    Dg Chest 2 View  02/10/2013  *RADIOLOGY REPORT*  Clinical Data: Cough and vomiting.  CHEST - 2 VIEW  Comparison: 04/06/2010  Findings: Stable appearance of mediastinal postoperative changes and cardiac pacemakers since previous study.  Shallow inspiration. Mild cardiac enlargement with mild pulmonary vascular congestion. No edema.  No focal consolidation or airspace disease.  No blunting of costophrenic angles.  Calcified granulomas on the right.  No pneumothorax.  Calcified and tortuous aorta.  Postoperative changes in the cervical spine and right shoulder.  IMPRESSION: Shallow inspiration with mild congestive changes.  No edema or consolidation.   Original Report Authenticated By: Burman Nieves, M.D.    Ct Head Wo Contrast  02/11/2013  *RADIOLOGY REPORT*  Clinical Data: Altered mental status; right-sided weakness  CT HEAD WITHOUT CONTRAST  Technique:  Contiguous axial images were obtained from the base of the skull through the vertex without  contrast.  Comparison:  February 10, 2013  Findings:  There is moderate diffuse atrophy which is stable.  The previously noted left cerebellar infarct is stable in distribution, effacing most of the left cerebellum with some sparing of the anterior superior left cerebellum.  There is moderate mass effect upon the fourth ventricle with effacement of portions of the fourth ventricle, stable.  There is no new mass effect seen in the posterior fossa.  There is patchy small vessel disease throughout the centra semiovale bilaterally, stable, more severe on the left than on the right.  There is evidence of a prior infarct involving the superior left lentiform nucleus.  There is a small prior infarct in the right caudate nucleus head region.  There is no new gray-white compartment lesions.  There is a stable small calcified posterior right parafalcine meningioma without surrounding edema measuring 9 x 6 mm, a finding not felt to have clinical significance.  There is no midline shift or extra-axial fluid.  There is no acute hemorrhage.  Bony calvarium appears intact.  Mastoids are clear.  IMPRESSION:  Stable appearing left cerebellar infarct causing extensive cytotoxic edema.  There is localized mass effect on the fourth ventricle, stable.  There is no new infarct compared to recent prior study.  No acute hemorrhage.  Underlying atrophy.   Original Report Authenticated By: Bretta Bang, M.D.    US Abdomen Complete  02/10/2013  *RADIOLOGY REPORT*  Clinical Data:  Leukocytosis.  Elevated amylase.  Renal insufficiency with creatinine 2.53.  COMPLETE ABDOMINAL ULTRASOUND  Comparison:  None.  Findings:  Gallbladder:  Approximate 7 mm shadowing gallstone in the neck of the gallbladder which did not move with changes in patient positioning.  No gallbladder wall thickening or pericholecystic fluid.  Negative sonographic Murphy's sign according to the ultrasound technologist.  Common bile duct:  Normal in caliber with maximum  diameter approximating 5 mm.  The distal duct was obscured by duodenal bowel gas.  Liver:  Normal size and echotexture without focal parenchymal abnormality.  Patent portal vein with hepatopetal flow.  IVC:  Patent.  Pancreas:  Although the pancreas is difficult to visualize in its entirety, no focal pancreatic abnormality is identified.  The distal body and tail were obscured by overlying bowel gas.  Spleen:  Normal size and echotexture without focal parenchymal abnormality.  Right Kidney:  No hydronephrosis.  Well-preserved cortex.  Normal parenchymal echotexture.  Approximate 3 cm simple cyst arising from the upper pole.  No significant focal parenchymal abnormality. Approximately 12.2 cm in length.  Left Kidney:  No hydronephrosis.  Severe diffuse cortical thinning with echogenic parenchyma.  No focal parenchymal abnormality. Approximately 10.1 cm in length.  Abdominal aorta:  Only visualized in its midportion due to bowel gas, normal in caliber measuring 2.2 cm.  IMPRESSION:  1.  7 mm gallstone which is impacted in the gallbladder neck.  No sonographic evidence of acute cholecystitis. 2.  No biliary ductal dilation. 3.  Normal-appearing pancreatic head and proximal body.  The distal body and tail were obscured by overlying bowel gas. 3.  Atrophic left kidney.  3 cm simple cyst in the upper pole of the otherwise normal-appearing right kidney.   Original Report Authenticated By: Hulan Saas, M.D.     Medications: Scheduled Meds: . aspirin EC  81 mg Oral Daily  . carvedilol  12.5 mg Oral BID WC  . clopidogrel  75 mg Oral Q breakfast  . clopidogrel  75 mg Oral Daily  . [START ON 02/12/2013] divalproex  500 mg Oral Daily  . doxazosin  4 mg Oral QHS  . enoxaparin (LOVENOX) injection  40 mg Subcutaneous Q24H  . furosemide  40 mg Oral BID  . insulin aspart  0-9 Units Subcutaneous TID WC  . insulin aspart  3 Units Subcutaneous TID WC  . insulin glargine  15 Units Subcutaneous QHS  . pantoprazole  40  mg Oral Daily  . promethazine  12.5 mg Intravenous Once  . ranolazine  500 mg Oral BID  . simvastatin  20 mg Oral QHS  . sodium chloride  3 mL Intravenous Q12H   Continuous Infusions: . sodium chloride 75 mL/hr at 02/11/13 0825   PRN Meds:.sodium chloride, acetaminophen, acetaminophen, nitroGLYCERIN, ondansetron (ZOFRAN) IV, senna-docusate, sodium chloride  Assessment/Plan:  Principal Problem:   CVA (cerebral infarction) Active Problems:   CAD   VENTRICULAR TACHYCARDIA, PAROXYSMAL   Chronic systolic heart failure   AUTOMATIC IMPLANTABLE CARDIAC DEFIBRILLATOR SITU   Atrial fibrillation   DM type 1 (diabetes mellitus, type 1)   CKD (chronic kidney disease) stage 4, GFR 15-29 ml/min   Acute Cerebellar CVA -This explains his symptoms. -Repeat CT shows cytotoxic edema. -As patient is symptomatic with severe HA, and increased vision disturbances, Dr. Pearlean Brownie has recommended transferring him to the  neuro ICU for closer monitoring.  -Doppler ok.  IDDM -CBGs up. -Will increase lantus from 15 to 20 units.  CKD Stage IV -Received contrast for a CTA yesterday. -Will check Cr in am to follow.  CAD/Chronic Systolic CHF -Stable, no CP.  A FIb -Not a coumadin candidate.  Time spent coordinating care: 35 minutes.   LOS: 2 days   [facility] Pager: [phone] 02/11/2013, 12:34 PM

## 2013-02-11 NOTE — Evaluation (Signed)
Clinical/Bedside Swallow Evaluation Patient Details  Name: Jason Walker MRN: [id number] Date of Birth: [birth year]  Today's Date: 02/11/2013 Time: 1040-1105 SLP Time Calculation (min): 25 min  Past Medical History:  Past Medical History  Diagnosis Date  . RBBB (right bundle branch block)   . AAA (abdominal aortic aneurysm)   . AMI (acute myocardial infarction)   . Diabetes mellitus   . Coronary artery disease   . Pacemaker   . Defibrillator activation   . Hypertension   . Heart disease   . Heart attack   . Wears dentures   . Hyperlipidemia   . Shortness of breath   . Kidney disease   . Hyperpotassemia   . Edema    Past Surgical History:  Past Surgical History  Procedure Laterality Date  . Coronary artery bypass graft  1990  . Icd  2011  . Hernia repair      umbilical and RIH  . Aneurysm coiling    . Rotator cuff repair      bilateral   HPI:  79 y.o. male presenting with acute onset nausea, vomiting accompanying episodes of unresponsive. Initial head CT negative. Repeat head CT indicated large left cerebellar CVA. ? left brainstem infarct not seen on CT noted by MD.     Assessment / Plan / Recommendation Clinical Impression  Bedside swallow evaluation complete. Patient more alert that during initial cognitive-linguistic evaluation 3/17. Presents with a mild oropharyngeal dysphagia at bedside as evidenced by generalized weakness resulting in a mild oral A-P tranist delay with regular texture solids and a suspected delay in swallow initiation resulting in intermittent coughing with thin liquids. Initially, cough prevented with SLP verbal and tactile cueing for small single sips. After completion of evaluation however, patient consuming thin liquids with RN  and noted to be coughing as well. Discussed with patient and daughter. Plan to downgrade to dysphagia 3 with nectar thick liquids as a precautions and complete a MBS this pm at 1330 to more objectively evaluate function.      Aspiration Risk  Moderate    Diet Recommendation Dysphagia 3 (Mechanical Soft);Nectar-thick liquid   Liquid Administration via: Cup;No straw Medication Administration: Whole meds with puree Supervision: Patient able to self feed;Full supervision/cueing for compensatory strategies Compensations: Slow rate;Small sips/bites Postural Changes and/or Swallow Maneuvers: Seated upright 90 degrees    Other  Recommendations Recommended Consults: MBS Oral Care Recommendations: Oral care BID Other Recommendations: Order thickener from pharmacy;Prohibited food (jello, ice cream, thin soups);Remove water pitcher   Follow Up Recommendations  Inpatient Rehab       Pertinent Vitals/Pain None reported     Swallow Study    General HPI: 79 y.o. male presenting with acute onset nausea, vomiting accompanying episodes of unresponsive. Initial head CT negative. Repeat head CT indicated large left cerebellar CVA. ? left brainstem infarct not seen on CT noted by MD.   Type of Study: Bedside swallow evaluation Previous Swallow Assessment: patient passed RN stroke swallow screen however given worsening dysarthria per notes, formal SLP swallow ordered.  Diet Prior to this Study: Regular;Thin liquids Temperature Spikes Noted: No Respiratory Status: Room air History of Recent Intubation: No Behavior/Cognition: Lethargic;Cooperative;Pleasant mood Oral Cavity - Dentition: Adequate natural dentition Self-Feeding Abilities: Able to feed self Patient Positioning: Upright in bed Baseline Vocal Quality: Clear Volitional Cough: Strong Volitional Swallow: Able to elicit    Oral/Motor/Sensory Function Overall Oral Motor/Sensory Function: Impaired (generalized bilateral weakness. no focal deficits noted. )   Ice Chips Ice chips: Not tested  Thin Liquid Thin Liquid: Impaired Presentation: Cup;Self Fed Pharyngeal  Phase Impairments: Suspected delayed Swallow;Cough - Immediate;Cough - Delayed    Nectar Thick  Nectar Thick Liquid: Not tested   Honey Thick Honey Thick Liquid: Not tested   Puree Puree: Within functional limits   Solid   GO   Jason Schnackenberg MA, CCC-SLP [phone]  Solid: Impaired Presentation: Self Fed Oral Phase Impairments: Impaired anterior to posterior transit Oral Phase Functional Implications:  (delayed oral transit with mild diffuse oral residue)       Jason Walker Jason Walker 02/11/2013,12:02 PM

## 2013-02-11 NOTE — Progress Notes (Signed)
Physical Therapy Treatment Patient Details Name: Jason Walker MRN: [id number] DOB: 04-27-1930 Today's Date: 02/11/2013 Time: 4098-1191 PT Time Calculation (min): 14 min  PT Assessment / Plan / Recommendation Comments on Treatment Session  Pt transferred to 3100 and remains lethargic however has demo'd improved functional mobility this date. Pt with increased active participation this date. Pt with improved ability to stand however now with strong L lateral lean compared to R lateral lean yesterday.    Follow Up Recommendations  SNF;Supervision/Assistance - 24 hour     Does the patient have the potential to tolerate intense rehabilitation     Barriers to Discharge        Equipment Recommendations   (TBD)    Recommendations for Other Services    Frequency Min 4X/week   Plan Discharge plan remains appropriate;Frequency remains appropriate    Precautions / Restrictions Precautions Precautions: Fall Restrictions Weight Bearing Restrictions: No   Pertinent Vitals/Pain Pt denies    Mobility  Bed Mobility Bed Mobility: Rolling Left;Left Sidelying to Sit;Sitting - Scoot to [place] of Bed;Sit to Sidelying Left Rolling Left: 1: +2 Total assist Rolling Left: Patient Percentage: 30% Left Sidelying to Sit: 1: +2 Total assist Left Sidelying to Sit: Patient Percentage: 30% Sitting - Scoot to Edge of Bed: 1: +2 Total assist Sitting - Scoot to Edge of Bed: Patient Percentage: 30% Sit to Sidelying Left: 1: +2 Total assist Sit to Sidelying Left: Patient Percentage: 20% Details for Bed Mobility Assistance: Pt with strong lean to Left in sitting. pt with improved ability to follow commands. pt was able to move LEs off EOB Transfers Transfers: Sit to Stand;Stand to Sit Sit to Stand: 1: +2 Total assist;From bed Sit to Stand: Patient Percentage: 30% Stand to Sit: 1: +2 Total assist;To bed Stand to Sit: Patient Percentage: 20% Details for Transfer Assistance: pt required +2 to maintain standing,  pt with wide base of support and bilat kneed locked in ext. Ambulation/Gait Ambulation/Gait Assistance: Not tested (comment) Wheelchair Mobility Wheelchair Mobility: No Modified Rankin (Stroke Patients Only) Pre-Morbid Rankin Score: No symptoms Modified Rankin: Severe disability    Exercises     PT Diagnosis:    PT Problem List:   PT Treatment Interventions:     PT Goals Acute Rehab PT Goals PT Goal: Supine/Side to Sit - Progress: Progressing toward goal PT Goal: Sit to Supine/Side - Progress: Progressing toward goal PT Goal: Sit to Stand - Progress: Progressing toward goal  Visit Information  Last PT Received On: 02/11/13 Assistance Needed: +2    Subjective Data  Subjective: Pt received supine in bed agreeable to PT.    Cognition  Cognition Arousal/Alertness: Lethargic Orientation Level: Disoriented to;Time;Situation Behavior During Session: Art gallery manager - Other Comments: pt very HOH and lethargic - unclear if pt is oriented    Development worker, international aid Balance Assessed: Yes Static Sitting Balance Static Sitting - Balance Support: Feet supported;Right upper extremity supported;Left upper extremity supported Static Sitting - Level of Assistance: 2: Max assist Static Sitting - Comment/# of Minutes: 3 Static Standing Balance Static Standing - Balance Support: Bilateral upper extremity supported Static Standing - Level of Assistance: 1: +2 Total assist Static Standing - Comment/# of Minutes: 3 min - attempted side stepping to HOB, pt with posterior, left lateral lean, pt able to step with R LE, maxA for L LE. pt with noted ataxia and decreased sequencing for stepping  End of Session PT - End of Session Equipment Utilized During Treatment: Gait belt Activity Tolerance: Patient limited by  fatigue Patient left: in bed;with call bell/phone within reach;with bed alarm set Nurse Communication: Mobility status   GP     Marcene Brawn 02/11/2013, 5:20 PM  Lewis Shock, PT, DPT Pager #: [phone] Office #: [phone]

## 2013-02-11 NOTE — Progress Notes (Signed)
Stroke Team Progress Note  HISTORY Yusif Gnau is an 79 y.o. male with known A-fib but not on AC due to previous life threating GI bleed, PVT s/p ICD, CKD. Patient presented to [facility] today 02-09-13 but transferred to [facility] due to lack of neurology coverage at [facility]. Patient was last seen normal at about 0100 am 02-09-13 when he went to bed. Wife found him the next morning about 6:15 am unresponsive and S/P vomiting. She also noted areas in room where he had previously gotten up in the night and been to kitchen. She was awoken at 6 by her husband when he tried to get out of the bed and fell. HE also vomited. Per family yesterday he had multiple episodes where he became diaphoretic and nonresponsive. Initial CT at [facility] was negative. Patient was not a TPA candidate at [facility] secondary to delay in arrival. He was admitted for further evaluation and treatment.  SUBJECTIVE His wife and daughter are at the bedside.  Overall he feels his condition is gradually worsening - more dysarthria today.   OBJECTIVE Most recent Vital Signs: Filed Vitals:   02/11/13 0100 02/11/13 0200 02/11/13 0532 02/11/13 0932  BP:  147/68 145/62 126/60  Pulse:  63 72 68  Temp:  97.3 F (36.3 C) 97.9 F (36.6 C) 97.2 F (36.2 C)  TempSrc:  Oral  Oral  Resp:  18 18 20   Weight: 87.4 kg (192 lb 10.9 oz)     SpO2:  100% 100% 100%   CBG (last 3)   Recent Labs  02/10/13 1911 02/10/13 2142 02/11/13 0743  GLUCAP 209* 200* 181*   IV Fluid Intake:   . sodium chloride 75 mL/hr at 02/11/13 0825   MEDICATIONS  . aspirin EC  81 mg Oral Daily  . carvedilol  12.5 mg Oral BID WC  . clopidogrel  75 mg Oral Q breakfast  . doxazosin  4 mg Oral QHS  . enoxaparin (LOVENOX) injection  40 mg Subcutaneous Q24H  . furosemide  40 mg Oral BID  . insulin aspart  0-9 Units Subcutaneous TID WC  . insulin aspart  3 Units Subcutaneous TID WC  . insulin glargine  15 Units Subcutaneous QHS  . pantoprazole  40 mg  Oral Daily  . promethazine  12.5 mg Intravenous Once  . ranolazine  500 mg Oral BID  . simvastatin  20 mg Oral QHS  . sodium chloride  3 mL Intravenous Q12H   PRN:  sodium chloride, acetaminophen, acetaminophen, nitroGLYCERIN, ondansetron (ZOFRAN) IV, senna-docusate, sodium chloride  Diet:  Carb Control thin liquids Activity:   OOB with assistance DVT Prophylaxis:  Lovenox 40 mg sq daily   CLINICALLY SIGNIFICANT STUDIES Basic Metabolic Panel:  Recent Labs Lab 02/09/13 1657 02/11/13 0530  NA 135 136  K 4.1 4.3  CL 96 96  CO2 28 27  GLUCOSE 172* 188*  BUN 54* 50*  CREATININE 2.53* 2.39*  CALCIUM 8.9 8.8   Liver Function Tests:  Recent Labs Lab 02/09/13 2003  AST 22  ALT 14  ALKPHOS 66  BILITOT 0.5  PROT 8.3  ALBUMIN 3.9   CBC:  Recent Labs Lab 02/09/13 1645  WBC 12.1*  HGB 12.7*  HCT 37.3*  MCV 87.1  PLT 189   Coagulation: No results found for this basename: LABPROT, INR,  in the last 168 hours Cardiac Enzymes: No results found for this basename: CKTOTAL, CKMB, CKMBINDEX, TROPONINI,  in the last 168 hours Urinalysis: No results found for this  basename: COLORURINE, APPERANCEUR, LABSPEC, PHURINE, GLUCOSEU, HGBUR, BILIRUBINUR, KETONESUR, PROTEINUR, UROBILINOGEN, NITRITE, LEUKOCYTESUR,  in the last 168 hours Lipid Panel    Component Value Date/Time   CHOL 168 02/10/2013 0500   TRIG 150* 02/10/2013 0500   HDL 35* 02/10/2013 0500   CHOLHDL 4.8 02/10/2013 0500   VLDL 30 02/10/2013 0500   LDLCALC 103* 02/10/2013 0500   HgbA1C  Lab Results  Component Value Date   HGBA1C 10.0* 02/10/2013   Urine Drug Screen:   No results found for this basename: labopia, cocainscrnur, labbenz, amphetmu, thcu, labbarb    Alcohol Level: No results found for this basename: ETH,  in the last 168 hours  US Abdomen Complete  02/10/2013   1.  7 mm gallstone which is impacted in the gallbladder neck.  No sonographic evidence of acute cholecystitis. 2.  No biliary ductal dilation. 3.   Normal-appearing pancreatic head and proximal body.  The distal body and tail were obscured by overlying bowel gas. 3.  Atrophic left kidney.  3 cm simple cyst in the upper pole of the otherwise normal-appearing right kidney.     CT of the brain  02/11/2013  Stable appearing left cerebellar infarct causing extensive cytotoxic edema.  There is localized mass effect on the fourth ventricle, stable.  There is no new infarct compared to recent prior study.  No acute hemorrhage.  Underlying atrophy.    CT Angio Head 02/10/2013  Moderate to large left cerebellar infarct with local mass effect. Presently no associated hemorrhage or hydrocephalus currently.  Although there is narrowing of portions of the vertebral arteries and basilar artery, no high-grade stenosis or thrombus identified.  Portions of the PICAs are noted bilaterally.  Portions of the AICA visualized bilaterally. Left AICA only noted proximally with poor delineation of the distal branches.  Mild to moderate narrowing of the cavernous segment of the internal carotid artery bilaterally.    MRI/A of the brain  pacer  2D Echocardiogram    Carotid Doppler    CXR  02/10/2013   Shallow inspiration with mild congestive changes.  No edema or consolidation.     EKG  atrial fibrillation, paced rhythm  Therapy Recommendations   Physical Exam   Elderly Caucasian male not in distress.Awake alert. Afebrile. Head is nontraumatic. Neck is supple without bruit. Hearing is normal. Cardiac exam no murmur or gallop. Lungs are clear to auscultation. Distal pulses are well felt. Neurological Exam : awake alert dysarthric but can be understood with some difficulty. No aphasia or apraxia. Pupils equal reactive. Fundi were not visualized. Vision acuity and fields appear normal. Extraocular moments are full range but there is slightly decreased but reactive the left with few beats of sustained nystagmus on left lateral gaze. Face is symmetric tongue is midline. Cough  and gag week. This no upper or lower expected drift but there is mild left finger-to-nose dysmetria with slight weakness of the left intrinsic hand muscles. Orbits right over left approximately. Lower extremity exam shows symmetric strength and coordination. Sensation is intact. Plantars are downgoing. Gait was not tested.  ASSESSMENT Mr. Jadore Veals is a 79 y.o. male found unresponsive, he had been vomiting. Imaging confirms a large left cerebellar embolic infarct with cytotoxic cerebral edema with mild hydrocephalus, suspect left brainstem infarct not well seen on CT. Infarct felt to be embolic secondary to known atrial fibrillation.  Not a coumadin candidate secondary to hx previous GI bleeding in Oct 2013 requiring 7 unit transfusions, at high risk for rebleeding per  gastroenterologist ( Dr Samuella Cota in [place], [place]). Therefore, on aspirin 81 mg orally every day and clopidogrel 75 mg orally every day prior to admission.  Now on aspirin 81 mg orally every day and clopidogrel 75 mg orally every day for secondary stroke prevention. Patient with resultant lethargy, nausea, vomiting, headache, dysarthria, visual abnormalities, dizziness, ? Dysphagia, eye movement abnormalities. Work up underway. Patient at risk for neurologic worsening. Will transfer to neuro ICU for monitoring.  Hypertension Hyperlipidemia, LDL 103, on statin PTA, on statin now, goal LDL < 100 Diabetes, HgbA1c 10.0  atrial fibrillation Pacer/defibrillator CAD - MI Hx GIB Oct 2013, at high risk for rebleed per GI  Hospital day # 2  TREATMENT/PLAN  Transfer to neuro ICU for closer monitoring.patient at risk for hydrocephalus and increase intracranial pressure. Ventriculostomy or posterior fossa craniotomy it will be tricky given the fact that he is on aspirin and Plavix. Hypertonic saline is also be difficult given his poor cardiac status.  I had a long discussion with his wife and multiple family members regarding his prognosis, plan  for evaluation and  NPO. ST reassess swallow. Diet per ST  depakote for headache  Therapy evals as able. Rehab consult  Continue aspirin 81 mg orally every day and clopidogrel 75 mg orally every day for secondary stroke prevention. Not an anticoagulation candidate secondary to hx GIB.  Dr. Pearlean Brownie discussed with wife, daughter and patient as well as family friend Baird Lyons, who is a neuro ICU RN here at [employer].  This patient is critically ill and at significant risk of neurological worsening and death. Patient care requires constant monitoring of vital signs, hemodynamics, respiratory and cardiac monitoring, and neurological assessment. Discussion with family, other specialists about plan of care. Medical decision making of high complexity. Dr. Pearlean Brownie spent 30 minutes of neurocritical care time in the care of this patient.   Annie Main, MSN, RN, ANVP-BC, ANP-BC, [facility] Pager: [phone] 02/11/2013 9:46 AM  I have personally obtained a history, examined the patient, evaluated imaging results, and formulated the assessment and plan of care. I agree with the above.  Delia Heady, MD

## 2013-02-11 NOTE — Progress Notes (Signed)
PT Cancellation Note  Patient Details Name: Jason Walker MRN: [id number] DOB: 11-Oct-1930   Cancelled Treatment:     Pt not seen this date due to pt regressing medically and being transferred to 3100. PT to re-attempt as able.  Lewis Shock, PT, DPT Pager #: [phone] Office #: [phone]

## 2013-02-11 NOTE — Progress Notes (Signed)
Planned for MBS this pm. Received a call from RN reporting increased lethargy. RN did not feel comfortable with patient leaving the floor or taking pos at this time. Advised to keep NPO if severe mental status changes noted, only meds whole in puree if needed. Will plan for MBS in am if patient appropriate. RN in agreement.   Ferdinand Lango MA, CCC-SLP [phone]

## 2013-02-11 NOTE — Progress Notes (Signed)
Report given to [facility] on 3100, ready to transport.

## 2013-02-12 ENCOUNTER — Inpatient Hospital Stay (HOSPITAL_COMMUNITY): Payer: Medicare Other

## 2013-02-12 DIAGNOSIS — I6330 Cerebral infarction due to thrombosis of unspecified cerebral artery: Secondary | ICD-10-CM

## 2013-02-12 DIAGNOSIS — E119 Type 2 diabetes mellitus without complications: Secondary | ICD-10-CM

## 2013-02-12 LAB — GLUCOSE, CAPILLARY: Glucose-Capillary: 116 mg/dL — ABNORMAL HIGH (ref 70–99)

## 2013-02-12 LAB — BASIC METABOLIC PANEL
BUN: 52 mg/dL — ABNORMAL HIGH (ref 6–23)
Creatinine, Ser: 2.53 mg/dL — ABNORMAL HIGH (ref 0.50–1.35)
GFR calc Af Amer: 26 mL/min — ABNORMAL LOW (ref >90)
GFR calc non Af Amer: 22 mL/min — ABNORMAL LOW (ref >90)

## 2013-02-12 MED ORDER — ENOXAPARIN SODIUM 30 MG/0.3ML ~~LOC~~ SOLN
30.0000 mg | SUBCUTANEOUS | Status: DC
  Administered 2013-02-12 – 2013-02-13 (×2): 30 mg via SUBCUTANEOUS
  Filled 2013-02-12 (×4): qty 0.3

## 2013-02-12 NOTE — Procedures (Signed)
Objective Swallowing Evaluation: Modified Barium Swallowing Study  Patient Details  Name: Jason Walker MRN: [id number] Date of Birth: 1930-10-05  Today's Date: 02/12/2013 Time: 4098-1191 SLP Time Calculation (min): 17 min  Past Medical History:  Past Medical History  Diagnosis Date  . RBBB (right bundle branch block)   . AAA (abdominal aortic aneurysm)   . AMI (acute myocardial infarction)   . Diabetes mellitus   . Coronary artery disease   . Pacemaker   . Defibrillator activation   . Hypertension   . Heart disease   . Heart attack   . Wears dentures   . Hyperlipidemia   . Shortness of breath   . Kidney disease   . Hyperpotassemia   . Edema    Past Surgical History:  Past Surgical History  Procedure Laterality Date  . Coronary artery bypass graft  1990  . Icd  2011  . Hernia repair      umbilical and RIH  . Aneurysm coiling    . Rotator cuff repair      bilateral   HPI:  79 y.o. male presenting with acute onset nausea, vomiting accompanying episodes of unresponsive. Initial head CT negative. Repeat head CT indicated large left cerebellar CVA. ? left brainstem infarct not seen on CT noted by MD.       Assessment / Plan / Recommendation Clinical Impression  Dysphagia Diagnosis: Mild oral phase dysphagia;Mild pharyngeal phase dysphagia Clinical impression: Patient presents with a mild sensory-motor based oropharyngeal dysphagia characterized by oral weakness resulting in a mild oral transit delay with soft solids and premature spillage of bolus over the base of the tongue. Additonally, when combined with suspected sensory deficits resulting from acute CVA, patient with a delayed swallow initiation resulting in silent penetration of thin liquids. Chin tuck ineffective to prevent however cued throat clear consistently clears penetrates and clinician provided verbal cueing (moderate-max) for small, single sip increased airway protection during today's study. Education complete  with patient regarding results of exam. Verbal reinforcement for use of compensatory strategies provided. Patient able to verbalize understanding of education. May advance to a regular diet, thin liquids. SLP will f/u.     Treatment Recommendation  Therapy as outlined in treatment plan below    Diet Recommendation Regular;Thin liquid   Liquid Administration via: Cup;No straw Medication Administration: Whole meds with puree Supervision: Patient able to self feed;Full supervision/cueing for compensatory strategies Compensations: Small sips/bites;Clear throat intermittently Postural Changes and/or Swallow Maneuvers: Seated upright 90 degrees    Other  Recommendations Oral Care Recommendations: Oral care BID   Follow Up Recommendations  Inpatient Rehab    Frequency and Duration min 2x/week  2 weeks   Pertinent Vitals/Pain None reported    SLP Swallow Goals Patient will utilize recommended strategies during swallow to increase swallowing safety with: Supervision/safety Swallow Study Goal #2 - Progress:  (new goal)   General HPI: 79 y.o. male presenting with acute onset nausea, vomiting accompanying episodes of unresponsive. Initial head CT negative. Repeat head CT indicated large left cerebellar CVA. ? left brainstem infarct not seen on CT noted by MD.   Type of Study: Modified Barium Swallowing Study Reason for Referral: Objectively evaluate swallowing function Previous Swallow Assessment: Bedside swallow eval complete 3/18-recommended a dysphagia 3 diet, nectar thick liquids and MBS to objectively evaluate function Diet Prior to this Study: Dysphagia 3 (soft);Nectar-thick liquids Temperature Spikes Noted: No Respiratory Status: Room air History of Recent Intubation: No Behavior/Cognition: Alert;Cooperative;Pleasant mood Oral Cavity - Dentition: Adequate natural dentition  Oral Motor / Sensory Function: Impaired - see Bedside swallow eval Self-Feeding Abilities: Able to feed  self Patient Positioning: Upright in chair Baseline Vocal Quality: Clear Volitional Cough: Strong Volitional Swallow: Able to elicit Anatomy: Within functional limits Pharyngeal Secretions: Not observed secondary MBS    Reason for Referral Objectively evaluate swallowing function   Oral Phase Oral Preparation/Oral Phase Oral Phase: Impaired Oral - Thin Oral - Thin Cup:  (premature spillage of bolus) Oral - Thin Straw:  (premature spillage of bolus) Oral - Solids Oral - Puree: Lingual pumping Oral - Mechanical Soft: Lingual pumping Oral - Pill: Lingual pumping Oral Phase - Comment Oral Phase - Comment: remains functional   Pharyngeal Phase Pharyngeal Phase Pharyngeal Phase: Impaired Pharyngeal - Thin Pharyngeal - Thin Cup: Delayed swallow initiation;Premature spillage to pyriform sinuses;Penetration/Aspiration during swallow;Compensatory strategies attempted (Comment) (chin tuck ineffective; small sips effective to prevent pen. ) Penetration/Aspiration details (thin cup): Material enters airway, remains ABOVE vocal cords and not ejected out (ejected with cued throat clear) Pharyngeal - Thin Straw: Delayed swallow initiation;Premature spillage to pyriform sinuses;Penetration/Aspiration during swallow;Compensatory strategies attempted (Comment) (chin tuck ineffective to prevent penetration) Penetration/Aspiration details (thin straw): Material enters airway, remains ABOVE vocal cords and not ejected out (ejected with cued throat clear) Pharyngeal - Solids Pharyngeal - Puree: Delayed swallow initiation;Premature spillage to valleculae Pharyngeal - Mechanical Soft: Delayed swallow initiation;Premature spillage to valleculae Pharyngeal - Pill: Delayed swallow initiation;Premature spillage to valleculae (provided whole with pureed solid)  Cervical Esophageal Phase    GO  Ferdinand Lango MA, CCC-SLP [phone]   Cervical Esophageal Phase Cervical Esophageal Phase: [facility]          Boss Danielsen [name] 02/12/2013, 11:53 AM

## 2013-02-12 NOTE — Progress Notes (Signed)
Physical Therapy Treatment Patient Details Name: Jason Walker MRN: [id number] DOB: 1930/01/13 Today's Date: 02/12/2013 Time: 0865-7846 PT Time Calculation (min): 44 min  PT Assessment / Plan / Recommendation Comments on Treatment Session  79 y.o. 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  He presents today much more alert and participative than last treatment session.  He is improving with his mobility and standing balance/gait.  He would be an excellent inpatient rehab candidate at this point based on gains made since yesterday and good family support/motivation.      Follow Up Recommendations  CIR     Does the patient have the potential to tolerate intense rehabilitation    yes  Barriers to Discharge   none      Equipment Recommendations  Rolling walker with 5" wheels    Recommendations for Other Services Rehab consult  Frequency Min 4X/week   Plan Discharge plan needs to be updated;Frequency remains appropriate    Precautions / Restrictions Precautions Precautions: Fall Precaution Comments: decreased sitting and standing balance   Pertinent Vitals/Pain No reports of HA today.  VSS on RA.      Mobility  Bed Mobility Supine to Sit: 4: Min assist;With rails;HOB elevated Sitting - Scoot to Edge of Bed: 4: Min assist;With rail Details for Bed Mobility Assistance: min assist to manually assist pt to reach for railing on his right side.  Pt with cueing about sequencing could get both legs and scoot his bottom to push up to sitting.  HOB ~35 degrees . Transfers Sit to Stand: 1: +2 Total assist;With upper extremity assist;From bed Sit to Stand: Patient Percentage: 80% Stand to Sit: 1: +2 Total assist;With upper extremity assist;To chair/3-in-1;With armrests Stand to Sit: Patient Percentage: 80% Details for Transfer Assistance: Pt using bed to support his  lower legs for balance.  Two person assist to stabilize trunk for balance during transition.  Pt initially leaning to the left.   Ambulation/Gait Ambulation/Gait Assistance: 1: +2 Total assist Ambulation/Gait: Patient Percentage: 70% Ambulation Distance (Feet): 5 Feet Assistive device: 2 person hand held assist Ambulation/Gait Assistance Details: 2 person assist to support trunk for balance,  Step-by-step instructions for gait sequencing.  Pt again, leaning to the left and anteriorly with gait.  Cues needed for upright and midline posture while stepping.   Gait Pattern: Narrow base of support;Step-through pattern;Lateral trunk lean to left;Trunk flexed Gait velocity: less than 1.8 ft/sec which indicates risk for recurrent falls      PT Goals Acute Rehab PT Goals PT Goal: Supine/Side to Sit - Progress: Progressing toward goal PT Goal: Sit to Stand - Progress: Progressing toward goal PT Transfer Goal: Bed to Chair/Chair to Bed - Progress: Progressing toward goal PT Goal: Ambulate - Progress: Progressing toward goal  Visit Information  Last PT Received On: 02/12/13 Assistance Needed: +2 PT/OT Co-Evaluation/Treatment: Yes    Subjective Data  Subjective: Pt more alert today, although with fatigue he still keeps his eyes closed.  No reports of visual tilting today.     Cognition  Cognition Arousal/Alertness: Lethargic Orientation Level: Disoriented to;Time (2004, also had a hard time telling us his age) Cognition - Other Comments: pt very HOH, but does need cueing for correction of some errors in questioning.      Balance  Static Sitting Balance Static Sitting - Balance Support: Bilateral upper extremity supported;Feet supported Static Sitting - Level of Assistance:  4: Min assist Static Sitting - Comment/# of Minutes: min assist to provide tactile cues to find midline posture.  Sat EOB 10 mins working on fingind midline, upright posture. reaching outside of BOS.   Static Standing  Balance Static Standing - Balance Support: Bilateral upper extremity supported Static Standing - Level of Assistance: 1: +2 Total assist (pt 70%) Static Standing - Comment/# of Minutes: once moved away from the bed pt had immediate left and posterior lean in standing. Needed verbal and manual cues to find midline, but when asked to self correct he would tend to over correct and go anterior  and right.  In standing worked on upright posture as well.    End of Session PT - End of Session Equipment Utilized During Treatment: Gait belt Activity Tolerance: Patient limited by fatigue Patient left: in chair;with call bell/phone within reach;with family/visitor present Nurse Communication: Mobility status     Lurena Joiner B. Coben Godshall, PT, DPT [phone]   02/12/2013, 10:24 AM

## 2013-02-12 NOTE — Progress Notes (Signed)
Occupational Therapy Treatment Patient Details Name: Jason Walker MRN: [id number] DOB: [birth year] Today's Date: 02/12/2013 Time: 4132-4401 OT Time Calculation (min): 33 min  OT Assessment / Plan / Recommendation Comments on Treatment Session Pt progressing this session for short mobility with max v/c and facilitation of weight shift to advance gait. Pt excellent CIR candidate    Follow Up Recommendations  CIR    Barriers to Discharge       Equipment Recommendations  3 in 1 bedside comode    Recommendations for Other Services    Frequency Min 3X/week   Plan Discharge plan needs to be updated    Precautions / Restrictions Precautions Precautions: Fall Precaution Comments: decreased sitting and standing balance   Pertinent Vitals/Pain Monitored and stable    ADL  Toilet Transfer: +2 Total assistance Toilet Transfer: Patient Percentage: 80% Toilet Transfer Method: Sit to stand Toilet Transfer Equipment: Regular height toilet Equipment Used: Gait belt Transfers/Ambulation Related to ADLs: incr bed mobility, sit<>stand with balance deficits. Pt with tactile cues and verbal cues to correct posture.  ADL Comments: Pt alert and aroused on arrival. Pt tracking therapist in room. pt demonstrates dysphagia . Pt exiting bed on Rt side with verbal cues for sequence and min tactile cue. Pt sitting EOB . Pt demonstrates Lt ribs closed  / Rt ribs open with Lt side hypotoxicity. Pt demonstrates inititaion of trunk extensors and able to sustain for ~ before requiring incr tactle cue or verbal cue. Pt provided tactile input for scapular depression and verbal cue for neck extension. Pt following simple 1 step commands consistently with quick response      OT Diagnosis:    OT Problem List:   OT Treatment Interventions:     OT Goals Acute Rehab OT Goals OT Goal Formulation: With patient Time For Goal Achievement: 02/26/13 Potential to Achieve Goals: Good ADL Goals Pt Will Perform  Eating: with supervision;Sitting, chair ADL Goal: Eating - Progress: Progressing toward goals Pt Will Perform Grooming: with supervision;Sitting, edge of bed ADL Goal: Grooming - Progress: Progressing toward goals Pt Will Transfer to Toilet: with mod assist;Stand pivot transfer;3-in-1 ADL Goal: Toilet Transfer - Progress: Progressing toward goals Miscellaneous OT Goals Miscellaneous OT Goal #1: Pt will perform bed mobility with mod assist in preparation for ADL at EOB. OT Goal: Miscellaneous Goal #1 - Progress: Met Miscellaneous OT Goal #2: Pt will sit EOB x 5 minutes with min guard assist in prep for ADL. OT Goal: Miscellaneous Goal #2 - Progress: Met Miscellaneous OT Goal #3: Pt will remain alert x 5 minutes with eyes open while engaged in therapeutic activity. OT Goal: Miscellaneous Goal #3 - Progress: Met  Visit Information  Last OT Received On: 02/12/13 Assistance Needed: +2 PT/OT Co-Evaluation/Treatment: Yes    Subjective Data      Prior Functioning       Cognition  Cognition Arousal/Alertness: Lethargic Orientation Level: Disoriented to;Time (2004 then self corrected 2014. delayed recall of age) Behavior During Session: WFL for tasks performed Cognition - Other Comments: HOH with cueing to problem solve and self correct    Mobility  Bed Mobility Supine to Sit: 4: Min assist;With rails;HOB elevated Sitting - Scoot to Edge of Bed: 4: Min assist;With rail Details for Bed Mobility Assistance: min assist to manually assist pt to reach for railing on his right side.  Pt with cueing about sequencing could get both legs and scoot his bottom to push up to sitting.  HOB ~35 degrees . Transfers Sit to Stand:  1: +2 Total assist;With upper extremity assist;From bed Sit to Stand: Patient Percentage: 80% Stand to Sit: 1: +2 Total assist;With upper extremity assist;To chair/3-in-1;With armrests Stand to Sit: Patient Percentage: 80% Details for Transfer Assistance: Pt using bed to  support his lower legs for balance.  Two person assist to stabilize trunk for balance during transition.  Pt initially leaning to the left.      Exercises      Balance Static Sitting Balance Static Sitting - Balance Support: Bilateral upper extremity supported;Feet supported Static Sitting - Level of Assistance: 4: Min assist Static Sitting - Comment/# of Minutes: min assist to provide tactile cues to find midline posture. Sat EOB 10 mins working on finding midline, upright posture. reaching outside of BOS.  Static Standing Balance Static Standing - Balance Support: Bilateral upper extremity supported Static Standing - Level of Assistance: 1: +2 Total assist Static Standing - Comment/# of Minutes:   once moved away from the bed pt had immediate left and posterior lean in standing. Needed verbal and manual cues to find midline, but when asked to self correct he would tend to over correct and go anterior and right. In standing worked on upright posture as well.     End of Session OT - End of Session Activity Tolerance: Patient tolerated treatment well Patient left: in chair;with call bell/phone within reach;with family/visitor present Nurse Communication: Mobility status  GO     Harrel Carina [facility] 02/12/2013, 11:14 AM Pager: [phone]

## 2013-02-12 NOTE — Consult Note (Signed)
Physical Medicine and Rehabilitation Consult Reason for Consult: Dizziness, balance deficits, gaze instability.  Referring Physician: Dr. Pearlean Brownie.    HPI: Jason Walker is a 79 y.o. male with history of DM, CAD, A-Fib but no anticoagulation due to life threating bleed, ICD; who was admitted to [facility] on 02/09/13 with  N/V, fall and unresponsiveness. Initial CCT negative. [facility]. 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 Abdominal U/S done due to elevated amylase and CKD. This revealed 7 mm gallstone, no cholecystitis and atrophic left kidney.  Carotid dopplers without ICA stenosis. 2D echo with EF 35% with diffuse hypokinesis and mild-moderate MVR. Neurology recommended continuing ASA/Plavix and close monitoring as at risk for hydrocephalus and increased ICP. Follow up CCT 03/18 with stable L-cerebellar infarct with extensive cytotoxic edema and mass effect. Placed on Depakote for headaches. Patient continues  Dizziness with balance and gaze disturbances. Mentation improving. Remains NPO due to lethargy-- swallow eval today? MD, PT, OT recommending CIR.   ROS: Denies Diplopia, decreased coordination. Fatigue. Denies nausea, occasional vertigo A 12 point review of systems has been performed and if not noted above is otherwise negative.  Past Medical History  Diagnosis Date  . RBBB (right bundle branch block)   . AAA (abdominal aortic aneurysm)   . AMI (acute myocardial infarction)   . Diabetes mellitus   . Coronary artery disease   . Pacemaker   . Defibrillator activation   . Hypertension   . Heart disease   . Heart attack   . Wears dentures   . Hyperlipidemia   . Shortness of breath   . Kidney disease   . Hyperpotassemia   . Edema    Past Surgical History  Procedure Laterality Date  . Coronary artery bypass graft  1990  . Icd  2011  . Hernia repair      umbilical  and RIH  . Aneurysm coiling    . Rotator cuff repair      bilateral   Family History  Problem Relation Age of Onset  . Heart disease Mother   . Diabetes Mother   . Cancer Brother     colon  . Heart disease Brother     heart attack  . Stroke Other   . Hypertension Other    Social History:  reports that he quit smoking about 25 years ago. His smoking use included Cigarettes. He smoked 0.00 packs per day for 30 years. He has never used smokeless tobacco. He reports that  drinks alcohol. He reports that he does not use illicit drugs. Allergies:  Allergies  Allergen Reactions  . Lorazepam   . Morphine And Related    Medications Prior to Admission  Medication Sig Dispense Refill  . aspirin 81 MG tablet Take 81 mg by mouth daily.      . carvedilol (COREG) 12.5 MG tablet Take 1 tablet (12.5 mg total) by mouth 2 (two) times daily.  180 tablet  3  . cholecalciferol (VITAMIN D) 1000 UNITS tablet Take 1,000 Units by mouth daily.      . clopidogrel (PLAVIX) 75 MG tablet Take 75 mg by mouth daily.      Marland Kitchen doxazosin (CARDURA) 4 MG tablet Take 4 mg by mouth at bedtime.        . furosemide (LASIX) 40 MG tablet Take 1 tablet (40 mg total) by mouth 2 (two) times daily.  30 tablet    .  insulin glargine (LANTUS) 100 UNIT/ML injection Inject 35 Units into the skin at bedtime.        Marland Kitchen lisinopril (PRINIVIL,ZESTRIL) 40 MG tablet Take 40 mg by mouth daily.        . metFORMIN (GLUCOPHAGE) 1000 MG tablet Take 1,000 mg by mouth 2 (two) times daily.        . nitroGLYCERIN (NITROSTAT) 0.4 MG SL tablet Place 0.4 mg under the tongue every 5 (five) minutes as needed.        Marland Kitchen omeprazole (PRILOSEC) 20 MG capsule Take 20 mg by mouth daily.       . ranolazine (RANEXA) 500 MG 12 hr tablet Take 500 mg by mouth 2 (two) times daily.        . simvastatin (ZOCOR) 40 MG tablet Take 20 mg by mouth at bedtime.         Home: Home Living Lives With: Spouse Available Help at Discharge: Family;Available  PRN/intermittently Type of Home: House Home Access: Level entry Home Layout: One level Bathroom Shower/Tub: Walk-in shower;Tub/shower unit;Curtain Bathroom Toilet: Handicapped height Bathroom Accessibility: Yes How Accessible: Accessible via walker Home Adaptive Equipment: Built-in shower seat  Functional History: Prior Function Able to Take Stairs?: Yes Driving: Yes Vocation: Retired Functional Status:  Mobility: Bed Mobility Bed Mobility: Rolling Left;Left Sidelying to Sit;Sitting - Scoot to [place] of Bed;Sit to Sidelying Left Rolling Left: 1: +2 Total assist Rolling Left: Patient Percentage: 30% Left Sidelying to Sit: 1: +2 Total assist Left Sidelying to Sit: Patient Percentage: 30% Supine to Sit: 4: Min assist;With rails;HOB elevated Sitting - Scoot to Edge of Bed: 4: Min assist;With rail Sitting - Scoot to [place] of Bed: Patient Percentage: 30% Sit to Sidelying Left: 1: +2 Total assist Sit to Sidelying Left: Patient Percentage: 20% Transfers Transfers: Sit to Stand;Stand to Sit Sit to Stand: 1: +2 Total assist;With upper extremity assist;From bed Sit to Stand: Patient Percentage: 80% Stand to Sit: 1: +2 Total assist;With upper extremity assist;To chair/3-in-1;With armrests Stand to Sit: Patient Percentage: 80% Ambulation/Gait Ambulation/Gait Assistance: 1: +2 Total assist Ambulation/Gait: Patient Percentage: 70% Ambulation Distance (Feet): 5 Feet Assistive device: 2 person hand held assist Ambulation/Gait Assistance Details: 2 person assist to support trunk for balance,  Step-by-step instructions for gait sequencing.  Pt again, leaning to the left and anteriorly with gait.  Cues needed for upright and midline posture while stepping.   Gait Pattern: Narrow base of support;Step-through pattern;Lateral trunk lean to left;Trunk flexed Gait velocity: less than 1.8 ft/sec which indicates risk for recurrent falls Wheelchair Mobility Wheelchair Mobility:  No  ADL: ADL Eating/Feeding: +1 Total assistance Where Assessed - Eating/Feeding: Bed level Grooming: +1 Total assistance Where Assessed - Grooming: Supported sitting Upper Body Bathing: +1 Total assistance Where Assessed - Upper Body Bathing: Supported sitting Lower Body Bathing: +1 Total assistance Where Assessed - Lower Body Bathing: Supported sitting Upper Body Dressing: +1 Total assistance Where Assessed - Upper Body Dressing: Supported sitting Lower Body Dressing: +1 Total assistance Where Assessed - Lower Body Dressing: Unsupported sitting Equipment Used: Gait belt Transfers/Ambulation Related to ADLs: stood and attempted side step to [facility] with +2 assist ADL Comments: Pt with lethargy, dependent in all ADL and mobility.  Nurse tech reports pt was more alert this morning, then become lethargic after vomiting.  Cognition: Cognition Overall Cognitive Status: Impaired Arousal/Alertness: Lethargic Orientation Level: Oriented X4 Attention: Focused Focused Attention: Impaired Focused Attention Impairment: Verbal basic;Functional basic (due to lethargy?) Memory:  (needs continued dx; recognizes family members) Comments: needs  continued differential diagnosis due to lethargy Cognition Overall Cognitive Status: Difficult to assess Difficult to assess due to: Level of arousal Arousal/Alertness: Lethargic Orientation Level: Disoriented to;Time (2004, also had a hard time telling us his age) Behavior During Session: Lethargic Cognition - Other Comments: pt very HOH, but does need cueing for correction of some errors in questioning.    Blood pressure 142/71, pulse 74, temperature 97.8 F (36.6 C), temperature source Oral, resp. rate 18, weight 87.4 kg (192 lb 10.9 oz), SpO2 99.00%.   PHYSICAL EXAM:  General: Alert and oriented x 3,  HEENT: Head is normocephalic, atraumatic, PERRLA, EOMI, sclera anicteric, oral mucosa pink and moist, dentition intact, ext ear canals clear,  Neck:  Supple without JVD or lymphadenopathy Heart: Reg rate and rhythm. No murmurs rubs or gallops Chest: CTA bilaterally without wheezes, rales, or rhonchi; no distress Abdomen: Soft, non-tender, non-distended, bowel sounds positive. Extremities: No clubbing, cyanosis, or edema. Pulses are 2+ Skin: Clean and intact without signs of breakdown Neuro:Pt with reasonable insight and awareness. Decreased oro-motor control. Speech quite dysarthric but intelligible. Left UE and LE limb ataxia. Right shoulder limited in ROM due to pain. Strength grossly 3-4/5 throughout all 4 limbs. Denies pain. No sensory dysfunction. Musculoskeletal: Full ROM, No pain with AROM or PROM in the neck, trunk, or extremities. Posture appropriate Psych: Pt's affect is appropriate. Pt is cooperative    Results for orders placed during the hospital encounter of 02/09/13 (from the past 24 hour(s))  GLUCOSE, CAPILLARY     Status: Abnormal   Collection Time    02/11/13 11:35 AM      Result Value Range   Glucose-Capillary 191 (*) 70 - 99 mg/dL   Comment 1 Documented in Chart     Comment 2 Notify RN    GLUCOSE, CAPILLARY     Status: Abnormal   Collection Time    02/11/13  1:33 PM      Result Value Range   Glucose-Capillary 182 (*) 70 - 99 mg/dL  GLUCOSE, CAPILLARY     Status: Abnormal   Collection Time    02/11/13  6:00 PM      Result Value Range   Glucose-Capillary 160 (*) 70 - 99 mg/dL   Comment 1 Notify RN     Comment 2 Documented in Chart    GLUCOSE, CAPILLARY     Status: Abnormal   Collection Time    02/11/13  9:48 PM      Result Value Range   Glucose-Capillary 165 (*) 70 - 99 mg/dL  BASIC METABOLIC PANEL     Status: Abnormal   Collection Time    02/12/13  3:49 AM      Result Value Range   Sodium 136  135 - 145 mEq/L   Potassium 4.0  3.5 - 5.1 mEq/L   Chloride 96  96 - 112 mEq/L   CO2 31  19 - 32 mEq/L   Glucose, Bld 155 (*) 70 - 99 mg/dL   BUN 52 (*) 6 - 23 mg/dL   Creatinine, Ser 1.61 (*) 0.50 - 1.35  mg/dL   Calcium 8.8  8.4 - 09.6 mg/dL   GFR calc non Af Amer 22 (*) >90 mL/min   GFR calc Af Amer 26 (*) >90 mL/min  GLUCOSE, CAPILLARY     Status: Abnormal   Collection Time    02/12/13  7:59 AM      Result Value Range   Glucose-Capillary 116 (*) 70 - 99 mg/dL   Ct  Angio Head W/cm &/or Wo Cm  02/10/2013  **ADDENDUM** CREATED: 02/10/2013 18:35:38  Critical Value/emergent results were called by telephone at the time of interpretation on 02/10/2013 at 6:35 p.m. to Dr. Thad Ranger ., who verbally acknowledged these results. **END ADDENDUM**   02/10/2013  *RADIOLOGY REPORT*  Clinical Data:  Vomiting.  Unresponsive.  Evaluate posterior circulation.  CT ANGIOGRAPHY HEAD  Technique:  Multidetector CT imaging of the head was performed using the standard protocol during bolus administration of intravenous contrast.  Multiplanar CT image reconstructions including MIPs were obtained to evaluate the vascular anatomy.  Contrast: 50mL OMNIPAQUE IOHEXOL 350 MG/ML SOLN  Comparison:   None.  Findings:  Moderate to large left posterior fossa infarct involving a majority of the left cerebellum sparing the superior aspect. Presently no associated hemorrhage however, there is associated mass effect and the patient may be at risk for development of hydrocephalus if there is further progression of edema.  Remote infarcts frontal lobes, basal ganglia and left thalamus. Small vessel disease type changes.  Global atrophy.  Ventricular prominence probably related to atrophy but will need to be monitored.  No intracranial hemorrhage.  No intracranial enhancing lesion.  The left vertebral artery is the dominant vertebral artery.  Mild narrowing of the left vertebral artery with ectasia.  Right vertebral artery is small after the takeoff of the right PICA.  Basilar artery is ectatic, slightly small and irregular although not occluded or without focal high-grade stenosis.  Portions of the PICAs are noted bilaterally.  Portions of the AICA  visualized bilaterally. Left AICA only noted proximally with poor delineation of the distal branches.  Atherosclerotic type changes with mild to moderate narrowing cavernous segment of the internal carotid artery bilaterally. Fetal type origin posterior cerebral artery bilaterally.  No high-grade stenosis of the M1 segment or A1 segment of the middle cerebral artery or anterior cerebral artery on either side.  Middle cerebral artery branch vessel irregularity bilaterally.  No aneurysm or vascular malformation noted.   Review of the MIP images confirms the above findings.  IMPRESSION: Moderate to large left cerebellar infarct with local mass effect. Presently no associated hemorrhage or hydrocephalus currently.  Although there is narrowing of portions of the vertebral arteries and basilar artery, no high-grade stenosis or thrombus identified.  Portions of the PICAs are noted bilaterally.  Portions of the AICA visualized bilaterally. Left AICA only noted proximally with poor delineation of the distal branches.  Mild to moderate narrowing of the cavernous segment of the internal carotid artery bilaterally.   Original Report Authenticated By: Lacy Duverney, M.D.    Ct Head Wo Contrast  02/11/2013  *RADIOLOGY REPORT*  Clinical Data: Altered mental status; right-sided weakness  CT HEAD WITHOUT CONTRAST  Technique:  Contiguous axial images were obtained from the base of the skull through the vertex without contrast.  Comparison:  February 10, 2013  Findings:  There is moderate diffuse atrophy which is stable.  The previously noted left cerebellar infarct is stable in distribution, effacing most of the left cerebellum with some sparing of the anterior superior left cerebellum.  There is moderate mass effect upon the fourth ventricle with effacement of portions of the fourth ventricle, stable.  There is no new mass effect seen in the posterior fossa.  There is patchy small vessel disease throughout the centra semiovale  bilaterally, stable, more severe on the left than on the right.  There is evidence of a prior infarct involving the superior left lentiform nucleus.  There is a  small prior infarct in the right caudate nucleus head region.  There is no new gray-white compartment lesions.  There is a stable small calcified posterior right parafalcine meningioma without surrounding edema measuring 9 x 6 mm, a finding not felt to have clinical significance.  There is no midline shift or extra-axial fluid.  There is no acute hemorrhage.  Bony calvarium appears intact.  Mastoids are clear.  IMPRESSION:  Stable appearing left cerebellar infarct causing extensive cytotoxic edema.  There is localized mass effect on the fourth ventricle, stable.  There is no new infarct compared to recent prior study.  No acute hemorrhage.  Underlying atrophy.   Original Report Authenticated By: Bretta Bang, M.D.     Assessment/Plan: Diagnosis: left cerbellar infarct with edema/mass effect 1. Does the need for close, 24 hr/day medical supervision in concert with the patient's rehab needs make it unreasonable for this patient to be served in a less intensive setting? Yes 2. Co-Morbidities requiring supervision/potential complications: RBBB, AAA, hx MI, htn 3. Due to bladder management, bowel management, safety, skin/wound care, disease management, medication administration, pain management and patient education, does the patient require 24 hr/day rehab nursing? Yes 4. Does the patient require coordinated care of a physician, rehab nurse, PT (1-2 hrs/day, 5 days/week), OT (1-2 hrs/day, 5 days/week) and SLP (1-2 hrs/day, 5 days/week) to address physical and functional deficits in the context of the above medical diagnosis(es)? Yes Addressing deficits in the following areas: balance, endurance, locomotion, strength, transferring, bowel/bladder control, bathing, dressing, feeding, grooming, toileting, speech, language and psychosocial  support 5. Can the patient actively participate in an intensive therapy program of at least 3 hrs of therapy per day at least 5 days per week? Yes 6. The potential for patient to make measurable gains while on inpatient rehab is excellent 7. Anticipated functional outcomes upon discharge from inpatient rehab are supervision with PT, supervision to min assist  with OT, supervision to mod I with SLP. 8. Estimated rehab length of stay to reach the above functional goals is: 2 weeks 9. Does the patient have adequate social supports to accommodate these discharge functional goals? Yes 10. Anticipated D/C setting: Home 11. Anticipated post D/C treatments: HH therapy 12. Overall Rehab/Functional Prognosis: excellent  RECOMMENDATIONS: This patient's condition is appropriate for continued rehabilitative care in the following setting: CIR Patient has agreed to participate in recommended program. Yes Note that insurance prior authorization may be required for reimbursement for recommended care.  Comment: Rehab RN to follow up.   Ranelle Oyster, MD, [place] [facility]     02/12/2013

## 2013-02-12 NOTE — Progress Notes (Signed)
Stroke Team Progress Note  HISTORY Jason Walker is an 79 y.o. male with known A-fib but not on AC due to previous life threating GI bleed, PVT s/p ICD, CKD. Patient presented to [facility] today 02-09-13 but transferred to [facility] due to lack of neurology coverage at [facility]. Patient was last seen normal at about 0100 am 02-09-13 when he went to bed. Wife found him the next morning about 6:15 am unresponsive and S/P vomiting. She also noted areas in room where he had previously gotten up in the night and been to kitchen. She was awoken at 6 by her husband when he tried to get out of the bed and fell. HE also vomited. Per family yesterday he had multiple episodes where he became diaphoretic and nonresponsive. Initial [facility] at [facility] was negative. Patient was not a TPA candidate at [facility] secondary to delay in arrival. He was admitted for further evaluation and treatment.  SUBJECTIVE Wife and daughter at the bedside. They feel he is stable. Wife works currently. Patient is more alert and interactive today. Denies headache.  OBJECTIVE Most recent Vital Signs: Filed Vitals:   02/12/13 0500 02/12/13 0600 02/12/13 0700 02/12/13 0741  BP:  125/98 129/54   Pulse: 69 70 68 72  Temp:      TempSrc:      Resp: 19 16 16 18   Weight:      SpO2: 100% 100% 99% 100%   CBG (last 3)   Recent Labs  02/11/13 1135 02/11/13 1333 02/11/13 1800  GLUCAP 191* 182* 160*   IV Fluid Intake:   . sodium chloride 75 mL/hr at 02/12/13 0700   MEDICATIONS  . aspirin EC  81 mg Oral Daily  . carvedilol  12.5 mg Oral BID WC  . clopidogrel  75 mg Oral Q breakfast  . divalproex  500 mg Oral Daily  . doxazosin  4 mg Oral QHS  . enoxaparin (LOVENOX) injection  40 mg Subcutaneous Q24H  . furosemide  40 mg Oral BID  . insulin aspart  0-9 Units Subcutaneous TID WC  . insulin aspart  3 Units Subcutaneous TID WC  . insulin glargine  20 Units Subcutaneous QHS  . pantoprazole  40 mg Oral Daily  . promethazine   12.5 mg Intravenous Once  . ranolazine  500 mg Oral BID  . simvastatin  20 mg Oral QHS  . sodium chloride  3 mL Intravenous Q12H   PRN:  sodium chloride, acetaminophen, acetaminophen, nitroGLYCERIN, ondansetron (ZOFRAN) IV, senna-docusate, sodium chloride  Diet:  Dysphagia 3 nectar thick liquids Activity:   OOB with assistance DVT Prophylaxis:  Lovenox 40 mg sq daily   CLINICALLY SIGNIFICANT STUDIES Basic Metabolic Panel:   Recent Labs Lab 02/11/13 0530 02/12/13 0349  NA 136 136  K 4.3 4.0  CL 96 96  CO2 27 31  GLUCOSE 188* 155*  BUN 50* 52*  CREATININE 2.39* 2.53*  CALCIUM 8.8 8.8   Liver Function Tests:   Recent Labs Lab 02/09/13 2003  AST 22  ALT 14  ALKPHOS 66  BILITOT 0.5  PROT 8.3  ALBUMIN 3.9   CBC:   Recent Labs Lab 02/09/13 1645  WBC 12.1*  HGB 12.7*  HCT 37.3*  MCV 87.1  PLT 189   Coagulation: No results found for this basename: LABPROT, INR,  in the last 168 hours Cardiac Enzymes: No results found for this basename: CKTOTAL, CKMB, CKMBINDEX, TROPONINI,  in the last 168 hours Urinalysis: No results found for this basename:  COLORURINE, APPERANCEUR, LABSPEC, PHURINE, GLUCOSEU, HGBUR, BILIRUBINUR, KETONESUR, PROTEINUR, UROBILINOGEN, NITRITE, LEUKOCYTESUR,  in the last 168 hours Lipid Panel    Component Value Date/Time   CHOL 168 02/10/2013 0500   TRIG 150* 02/10/2013 0500   HDL 35* 02/10/2013 0500   CHOLHDL 4.8 02/10/2013 0500   VLDL 30 02/10/2013 0500   LDLCALC 103* 02/10/2013 0500   HgbA1C  Lab Results  Component Value Date   HGBA1C 10.0* 02/10/2013   Urine Drug Screen:   No results found for this basename: labopia,  cocainscrnur,  labbenz,  amphetmu,  thcu,  labbarb    Alcohol Level: No results found for this basename: ETH,  in the last 168 hours  US Abdomen Complete  02/10/2013   1.  7 mm gallstone which is impacted in the gallbladder neck.  No sonographic evidence of acute cholecystitis. 2.  No biliary ductal dilation. 3.  Normal-appearing  pancreatic head and proximal body.  The distal body and tail were obscured by overlying bowel gas. 3.  Atrophic left kidney.  3 cm simple cyst in the upper pole of the otherwise normal-appearing right kidney.     CT of the brain  02/11/2013  Stable appearing left cerebellar infarct causing extensive cytotoxic edema.  There is localized mass effect on the fourth ventricle, stable.  There is no new infarct compared to recent prior study.  No acute hemorrhage.  Underlying atrophy.    CT Angio Head 02/10/2013  Moderate to large left cerebellar infarct with local mass effect. Presently no associated hemorrhage or hydrocephalus currently.  Although there is narrowing of portions of the vertebral arteries and basilar artery, no high-grade stenosis or thrombus identified.  Portions of the PICAs are noted bilaterally.  Portions of the AICA visualized bilaterally. Left AICA only noted proximally with poor delineation of the distal branches.  Mild to moderate narrowing of the cavernous segment of the internal carotid artery bilaterally.    MRI/A of the brain  pacer  2D Echocardiogram  The estimated ejection fraction was 35%. Diffuse hypokinesis. There was a reduced contribution of atrial contraction to ventricular filling, due to increased ventricular diastolic pressure or atrial contractile dysfunction.  Carotid Doppler  No significant extracranial carotid artery stenosis demonstrated. Atypical vertebral flow bilaterally suggesting proximal obstruction.  CXR  02/10/2013   Shallow inspiration with mild congestive changes.  No edema or consolidation.     EKG  atrial fibrillation, paced rhythm  Therapy Recommendations SNF  Physical Exam   Elderly Caucasian male not in distress.Awake alert. Afebrile. Head is nontraumatic. Neck is supple without bruit. Hearing is normal. Cardiac exam no murmur or gallop. Lungs are clear to auscultation. Distal pulses are well felt. Neurological Exam : awake alert dysarthric but  can be understood with some difficulty. No aphasia or apraxia. Pupils equal reactive. Fundi were not visualized. Vision acuity and fields appear normal. Extraocular moments are full range but there is slightly decreased but reactive the left with few beats of sustained nystagmus on left lateral gaze. Face is symmetric tongue is midline. Cough and gag week. This no upper or lower expected drift but there is mild left finger-to-nose dysmetria with slight weakness of the left intrinsic hand muscles. Orbits right over left approximately. Lower extremity exam shows symmetric strength and coordination. Sensation is intact. Plantars are downgoing. Gait was not tested.  ASSESSMENT [name] is a 79 y.o. male found unresponsive, he had been vomiting. 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 Infarcts felt to be embolic secondary to known atrial fibrillation.  Not a coumadin candidate secondary to hx previous GI bleeding in Oct 2013 requiring 7 unit transfusions, at high risk for rebleeding per gastroenterologist ( Dr Samuella Cota in [place], [place]). Therefore, on aspirin 81 mg orally every day and clopidogrel 75 mg orally every day prior to admission.  Now on aspirin 81 mg orally every day and clopidogrel 75 mg orally every day for secondary stroke prevention. Patient with resultant lethargy, nausea, vomiting, headache, dysarthria, visual abnormalities, dizziness, dysphagia, eye movement abnormalities. Work up underway. Patient continues to be at risk for neurologic worsening. Overall, some improvement since yesterday.  Hypertension Hyperlipidemia, LDL 103, on statin PTA, on statin now, goal LDL < 100 Diabetes, HgbA1c 10.0  atrial fibrillation Headache. Placed on depakote. Headache improving per pt. Pacer/defibrillator CAD - MI Hx GIB Oct 2013, at high risk for rebleed per GI CKD, stage IV. Cr  up to 2.53 post contrast. Mild worsening over night.  Hospital day # 3  TREATMENT/PLAN  Continue ICU monitoring. Patient at risk for hydrocephalus and increase intracranial pressure. Ventriculostomy or posterior fossa craniotomy it will be tricky given the fact that he is on aspirin and Plavix. Hypertonic saline is also be difficult given his poor cardiac status.  OOB. Therapy evals  MBSS. Diet per ST  Rehab consult  Continue aspirin 81 mg orally every day and clopidogrel 75 mg orally every day for secondary stroke prevention. Not an anticoagulation candidate secondary to hx GIB.  CT head in am  Dr. Pearlean Brownie discussed with wife, daughter and patient as well as family friend Baird Lyons, who is a neuro ICU RN here at [employer].     Annie Main, MSN, RN, ANVP-BC, ANP-BC, [facility] Pager: [phone] 02/12/2013 8:13 AM  I have personally obtained a history, examined the patient, evaluated imaging results, and formulated the assessment and plan of care. I agree with the above.  Delia Heady, MD

## 2013-02-12 NOTE — Progress Notes (Signed)
Rehab Admissions Coordinator Note:  Patient was screened by Clois Dupes for appropriateness for an Inpatient Acute Rehab Consult.  At this time, we are recommending Inpatient Rehab consult which is pending today.  Clois Dupes 02/12/2013, 11:27 AM  I can be reached at [phone].

## 2013-02-12 NOTE — Progress Notes (Signed)
[facility] Progress Note [place] TEAM 1 - Stepdown/ICU TEAM   Jason Walker WUJ:[id number] DOB: Dec 16, 1929 DOA: 02/09/2013 PCP: Zachery Dauer, MD  Brief narrative: 79 y/o man with PMH significant for a fib not on anticoagulation due to a life-threatening GI Bleed, paroxysmal v tach s/p ICD, CKD Stage IV, CAD and IDDM. He presented to [facility] and was transferred to [facility] due to lack of Neurology coverage at [facility]. Patient was last seen normal at about 10 pm the night before admit when he went to bed. Wife found him on the floor, unresponsive at 6:15 the following AM covered in vomit. At his arrival to [facility], he was noted to have a prominent left facial droop and was completely unresponsive. CT scan was apparently negative. NIH score reported as 32. TPA was not given due to delay in arrival.  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 Pearlean Brownie has recommended continuing to monitor him in the neuro ICU -care as per Neuro recs   HTN BP is currently well controlled - no change in tx plan today   HLD Continue Zocor as per home dosing  IDDM  -CBGs are reasonably controlled at this time - no change in treatment plan today  CKD Stage IV - atrophic left kidney noted on ultrasound -baseline crt 2.0 in 2011 -received contrast for a CTA -follow crt and avoid further potential nephrotoxic agents  History of CAD/Chronic Systolic CHF  -Clinically stable at the present time  A FIb  -Not a coumadin candidate due to history of significant GI bleeding as noted above - continue aspirin and Plavix   AAA Asymptomatic  Cholelithiasis without evidence of cholecystitis Ultrasound results noted - patient is entirely asymptomatic - follow clinically  Code  Status: Full Family Communication: No family present at time of evaluation Disposition Plan: ICU  Consultants: Neurology   Procedures: US Abdomen Complete 02/10/2013 1. 7 mm gallstone which is impacted in the gallbladder neck. No sonographic evidence of acute cholecystitis. 2. No biliary ductal dilation. 3. Normal-appearing pancreatic head and proximal body. The distal body and tail were obscured by overlying bowel gas. 3. Atrophic left kidney. 3 cm simple cyst in the upper pole of the otherwise normal-appearing right kidney.   CT of the brain 02/11/2013 Stable appearing left cerebellar infarct causing extensive cytotoxic edema. There is localized mass effect on the fourth ventricle, stable. There is no new infarct compared to recent prior study. No acute hemorrhage. Underlying atrophy.   CT Angio Head 02/10/2013 Moderate to large left cerebellar infarct with local mass effect. Presently no associated hemorrhage or hydrocephalus currently. Although there is narrowing of portions of the vertebral arteries and basilar artery, no high-grade stenosis or thrombus identified. Portions of the PICAs are noted bilaterally. Portions of the AICA visualized bilaterally. Left AICA only noted proximally with poor delineation of the distal branches. Mild to moderate narrowing of the cavernous segment of the internal carotid artery bilaterally.   2D Echocardiogram The estimated ejection fraction was 35%. Diffuse hypokinesis. There was a reduced contribution of atrial contraction to ventricular filling, due to increased ventricular diastolic pressure or atrial contractile dysfunction.   Carotid Doppler No significant extracranial carotid artery stenosis demonstrated. Atypical vertebral flow bilaterally suggesting proximal obstruction  Antibiotics: none  DVT prophylaxis: lovenox  HPI/Subjective: The  patient is alert and conversant though his speech is somewhat garbled.  He denies specific complaints reporting no  chest pain no shortness of breath and no abdominal pain.   Objective: Blood pressure 142/71, pulse 74, temperature 97.8 F (36.6 C), temperature source Oral, resp. rate 18, weight 87.4 kg (192 lb 10.9 oz), SpO2 99.00%.  Intake/Output Summary (Last 24 hours) at 02/12/13 1434 Last data filed at 02/12/13 0700  Gross per 24 hour  Intake   1425 ml  Output   1550 ml  Net   -125 ml     Exam: General: No acute respiratory distress Lungs: Clear to auscultation bilaterally without wheezes or crackles Cardiovascular: Regular rate and rhythm without murmur gallop or rub  Abdomen: Nontender, nondistended, soft, bowel sounds positive, no rebound, no ascites, no appreciable mass Extremities: No significant cyanosis, clubbing, or edema bilateral lower extremities Neuro:  Agree with stroke team note  Data Reviewed: Basic Metabolic Panel:  Recent Labs Lab 02/09/13 1657 02/11/13 0530 02/12/13 0349  NA 135 136 136  K 4.1 4.3 4.0  CL 96 96 96  CO2 28 27 31   GLUCOSE 172* 188* 155*  BUN 54* 50* 52*  CREATININE 2.53* 2.39* 2.53*  CALCIUM 8.9 8.8 8.8   Liver Function Tests:  Recent Labs Lab 02/09/13 2003  AST 22  ALT 14  ALKPHOS 66  BILITOT 0.5  PROT 8.3  ALBUMIN 3.9    Recent Labs Lab 02/09/13 2003  LIPASE 33  AMYLASE 106*   CBC:  Recent Labs Lab 02/09/13 1645  WBC 12.1*  HGB 12.7*  HCT 37.3*  MCV 87.1  PLT 189   CBG:  Recent Labs Lab 02/11/13 1135 02/11/13 1333 02/11/13 1800 02/11/13 2148 02/12/13 0759  GLUCAP 191* 182* 160* 165* 116*    Studies:  Recent x-ray studies have been reviewed in detail by the Attending Physician  Scheduled Meds:  Scheduled Meds: . aspirin EC  81 mg Oral Daily  . carvedilol  12.5 mg Oral BID WC  . clopidogrel  75 mg Oral Q breakfast  . divalproex  500 mg Oral Daily  . doxazosin  4 mg Oral QHS  . enoxaparin (LOVENOX) injection  30 mg Subcutaneous Q24H  . furosemide  40 mg Oral BID  . insulin aspart  0-9 Units  Subcutaneous TID WC  . insulin aspart  3 Units Subcutaneous TID WC  . insulin glargine  20 Units Subcutaneous QHS  . pantoprazole  40 mg Oral Daily  . promethazine  12.5 mg Intravenous Once  . ranolazine  500 mg Oral BID  . simvastatin  20 mg Oral QHS  . sodium chloride  3 mL Intravenous Q12H   Continuous Infusions: . sodium chloride 75 mL/hr at 02/12/13 0700    Time spent on care of this patient: 35 mins   [facility] T  [facility] Office  [phone] Pager - Text Page per Loretha Stapler as per below:  On-Call/Text Page:      [email or web address]      password [email or web address]  If 7PM-7AM, please contact night-coverage [email or web address] Password [email or web address] 02/12/2013, 2:34 PM   LOS: 3 days

## 2013-02-12 NOTE — Progress Notes (Signed)
Pt for possible CIR admit. Spoke w/ pt & pt's wife Roxan Diesel phone]-they are interested in CIR.  For CIR admit, pt needs to be medically ready, and, CIR bed needs to be available.  Will follow.  [phone]

## 2013-02-13 ENCOUNTER — Encounter (HOSPITAL_COMMUNITY): Payer: Self-pay | Admitting: Radiology

## 2013-02-13 ENCOUNTER — Inpatient Hospital Stay (HOSPITAL_COMMUNITY): Payer: Medicare Other

## 2013-02-13 DIAGNOSIS — N184 Chronic kidney disease, stage 4 (severe): Secondary | ICD-10-CM

## 2013-02-13 DIAGNOSIS — E109 Type 1 diabetes mellitus without complications: Secondary | ICD-10-CM

## 2013-02-13 LAB — BASIC METABOLIC PANEL
BUN: 46 mg/dL — ABNORMAL HIGH (ref 6–23)
CO2: 30 mEq/L (ref 19–32)
Calcium: 8.7 mg/dL (ref 8.4–10.5)
Glucose, Bld: 124 mg/dL — ABNORMAL HIGH (ref 70–99)
Potassium: 3.8 mEq/L (ref 3.5–5.1)
Sodium: 136 mEq/L (ref 135–145)

## 2013-02-13 LAB — CBC
Hemoglobin: 10.9 g/dL — ABNORMAL LOW (ref 13.0–17.0)
MCH: 29.5 pg (ref 26.0–34.0)
MCHC: 34.3 g/dL (ref 30.0–36.0)
MCV: 85.9 fL (ref 78.0–100.0)
RBC: 3.7 MIL/uL — ABNORMAL LOW (ref 4.22–5.81)

## 2013-02-13 LAB — GLUCOSE, CAPILLARY
Glucose-Capillary: 140 mg/dL — ABNORMAL HIGH (ref 70–99)
Glucose-Capillary: 125 mg/dL — ABNORMAL HIGH (ref 70–99)
Glucose-Capillary: 187 mg/dL — ABNORMAL HIGH (ref 70–99)

## 2013-02-13 NOTE — Progress Notes (Signed)
No bed available CIR today.  Met w/ pt's wife & daughter [pt asleep] to update.  Will f/up tomorrow.  [phone]

## 2013-02-13 NOTE — Clinical Social Work Placement (Signed)
     Clinical Social Work Department CLINICAL SOCIAL WORK PLACEMENT NOTE 02/13/2013  Patient:  Jason Walker, Jason Walker  Account Number:  [id number] Admit date:  02/09/2013  Clinical Social Worker:  Macario Golds, LCSW  Date/time:  02/13/2013 12:30 PM  Clinical Social Work is seeking post-discharge placement for this patient at the following level of care:   SKILLED NURSING   (*CSW will update this form in Epic as items are completed)   02/13/2013  Patient/family provided with [facility] Department of Clinical Social Works list of facilities offering this level of care within the geographic area requested by the patient (or if unable, by the patients family).  02/13/2013  Patient/family informed of their freedom to choose among providers that offer the needed level of care, that participate in Medicare, Medicaid or managed care program needed by the patient, have an available bed and are willing to accept the patient.  02/13/2013  Patient/family informed of [facility] ownership interest in [facility], as well as of the fact that they are under no obligation to receive care at this facility.  PASARR submitted to EDS on  PASARR number received from EDS on   FL2 transmitted to all facilities in geographic area requested by pt/family on  02/13/2013 FL2 transmitted to all facilities within larger geographic area on   Patient informed that his/her managed care company has contracts with or will negotiate with  certain facilities, including the following:     Patient/family informed of bed offers received:   Patient chooses bed at  Physician recommends and patient chooses bed at    Patient to be transferred to  on   Patient to be transferred to facility by   The following physician request were entered in Epic:   Additional Comments: 03/20 No Pasarr submission due to [place] placement. Faxed to [place] area

## 2013-02-13 NOTE — Progress Notes (Signed)
Speech Language Pathology Treatment Patient Details Name: Jason Walker MRN: [id number] DOB: [birth year] Today's Date: 02/13/2013 Time: 1110-1130 SLP Time Calculation (min): 20 min  Assessment / Plan / Recommendation Clinical Impression  SLP provided max initial fading to moderate verbal cueing for use of speech intelligibility strategies during functional conversation which was successful at maximizing communication of needs/wants.  Able to self feed clinician provided po trials without overt s/s of aspiration for diet tolerance assessment, with moderate-max cues for consistent use of swallowing precautions including small single sips and intermittent throat clearing. Education complete with patient and family (daughter and wife) regarding results of MBS, important of aspiration precautions, and use of speech intelligibility strategies. Overall, patient making good progress with functional goals.     SLP Plan  Continue with current plan of care    Pertinent Vitals/Pain None reported  SLP Goals  SLP Goals Potential to Achieve Goals: Good SLP Goal #1: Patient will sustain alert state with min verbal cues to complete diagnostic treatment of higher level language and cognitive function.  SLP Goal #1 - Progress: Progressing toward goal SLP Goal #2: Patient will utilize speech intelligibility strategies with moderate cues at the conversation level.  SLP Goal #2 - Progress: Progressing toward goal  Swallowing Goals  SLP Swallowing Goals Patient will utilize recommended strategies during swallow to increase swallowing safety with: Supervision/safety Swallow Study Goal #2 - Progress: Progressing toward goal    Jason Lango MA, CCC-SLP [phone]   Jason Walker Jason Walker 02/13/2013, 11:46 AM

## 2013-02-13 NOTE — Progress Notes (Signed)
[facility] Progress Note [place] TEAM 1 - Stepdown/ICU TEAM   Hansel Devan ZOX:[id number] DOB: 06/22/1930 DOA: 02/09/2013 PCP: Zachery Dauer, MD  Brief narrative: 79 y/o man with PMH significant for a fib not on anticoagulation due to a life-threatening GI Bleed, paroxysmal v tach s/p ICD, CKD Stage IV, CAD and IDDM. He presented to [facility] and was transferred to [facility] due to lack of Neurology coverage at [facility]. Patient was last seen normal at about 10 pm the night before admit when he went to bed. Wife found him on the floor, unresponsive at 6:15 the following AM covered in vomit. At his arrival to [facility], he was noted to have a prominent left facial droop and was completely unresponsive. CT scan was apparently negative. NIH score reported as 32. TPA was not given due to delay in arrival.  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 -care as per Neuro recs   HTN BP is currently well controlled - no change in tx plan today   HLD Continue Zocor as per home dosing  IDDM  -CBGs are reasonably controlled at this time - no change in treatment plan today  CKD Stage IV - atrophic left kidney noted on ultrasound -baseline crt 2.0 in 2011 -received contrast for a CTA -follow crt and avoid further potential nephrotoxic agents  History of CAD/Chronic Systolic CHF  -Clinically stable at the present time  A FIb  -Not a coumadin candidate due to history of significant GI bleeding as noted above - continue aspirin and Plavix   AAA Asymptomatic  Cholelithiasis without evidence of cholecystitis Ultrasound results noted - patient is entirely asymptomatic - follow clinically  Code Status: Full Family Communication: No family present at time  of evaluation Disposition Plan: CIR, ?soon  Consultants: Neurology   Procedures: US Abdomen Complete 02/10/2013 1. 7 mm gallstone which is impacted in the gallbladder neck. No sonographic evidence of acute cholecystitis. 2. No biliary ductal dilation. 3. Normal-appearing pancreatic head and proximal body. The distal body and tail were obscured by overlying bowel gas. 3. Atrophic left kidney. 3 cm simple cyst in the upper pole of the otherwise normal-appearing right kidney.   CT of the brain 02/11/2013 Stable appearing left cerebellar infarct causing extensive cytotoxic edema. There is localized mass effect on the fourth ventricle, stable. There is no new infarct compared to recent prior study. No acute hemorrhage. Underlying atrophy.   CT Angio Head 02/10/2013 Moderate to large left cerebellar infarct with local mass effect. Presently no associated hemorrhage or hydrocephalus currently. Although there is narrowing of portions of the vertebral arteries and basilar artery, no high-grade stenosis or thrombus identified. Portions of the PICAs are noted bilaterally. Portions of the AICA visualized bilaterally. Left AICA only noted proximally with poor delineation of the distal branches. Mild to moderate narrowing of the cavernous segment of the internal carotid artery bilaterally.   2D Echocardiogram The estimated ejection fraction was 35%. Diffuse hypokinesis. There was a reduced contribution of atrial contraction to ventricular filling, due to increased ventricular diastolic pressure or atrial contractile dysfunction.   Carotid Doppler No significant extracranial carotid artery stenosis demonstrated. Atypical vertebral flow bilaterally suggesting proximal obstruction  Antibiotics: none  DVT prophylaxis: lovenox  HPI/Subjective: The patient is alert and conversant. He denies specific  complaints reporting no chest pain no shortness of breath and no abdominal pain. Wife present and updated on plan of  care.   Objective: Blood pressure 137/70, pulse 73, temperature 97.7 F (36.5 C), temperature source Oral, resp. rate 17, height 5\' 6"  (1.676 m), weight 87.4 kg (192 lb 10.9 oz), SpO2 98.00%.  Intake/Output Summary (Last 24 hours) at 02/13/13 1316 Last data filed at 02/13/13 1200  Gross per 24 hour  Intake 1830.42 ml  Output   2100 ml  Net -269.58 ml     Exam: General: No acute respiratory distress Lungs: Clear to auscultation bilaterally without wheezes or crackles Cardiovascular: Regular rate and rhythm without murmur gallop or rub  Abdomen: Nontender, nondistended, soft, bowel sounds positive, no rebound, no ascites, no appreciable mass Extremities: No significant cyanosis, clubbing, or edema bilateral lower extremities Neuro:  Agree with stroke team note  Data Reviewed: Basic Metabolic Panel:  Recent Labs Lab 02/09/13 1657 02/11/13 0530 02/12/13 0349 02/13/13 0320  NA 135 136 136 136  K 4.1 4.3 4.0 3.8  CL 96 96 96 97  CO2 28 27 31 30   GLUCOSE 172* 188* 155* 124*  BUN 54* 50* 52* 46*  CREATININE 2.53* 2.39* 2.53* 2.32*  CALCIUM 8.9 8.8 8.8 8.7   Liver Function Tests:  Recent Labs Lab 02/09/13 2003  AST 22  ALT 14  ALKPHOS 66  BILITOT 0.5  PROT 8.3  ALBUMIN 3.9    Recent Labs Lab 02/09/13 2003  LIPASE 33  AMYLASE 106*   CBC:  Recent Labs Lab 02/09/13 1645 02/13/13 0320  WBC 12.1* 5.3  HGB 12.7* 10.9*  HCT 37.3* 31.8*  MCV 87.1 85.9  PLT 189 120*   CBG:  Recent Labs Lab 02/11/13 2148 02/12/13 0759 02/12/13 1657 02/12/13 2204 02/13/13 0816  GLUCAP 165* 116* 201* 178* 140*    Scheduled Meds:  Scheduled Meds: . aspirin EC  81 mg Oral Daily  . carvedilol  12.5 mg Oral BID WC  . clopidogrel  75 mg Oral Q breakfast  . divalproex  500 mg Oral Daily  . doxazosin  4 mg Oral QHS  . enoxaparin (LOVENOX) injection  30 mg Subcutaneous Q24H  . insulin aspart  0-9 Units Subcutaneous TID WC  . insulin aspart  3 Units Subcutaneous TID WC   . insulin glargine  20 Units Subcutaneous QHS  . pantoprazole  40 mg Oral Daily  . ranolazine  500 mg Oral BID  . simvastatin  20 mg Oral QHS   Continuous Infusions: . sodium chloride 50 mL/hr at 02/13/13 1200    Time spent on care of this patient: 35 mins   [facility] [place]  [facility] Office  [phone] Pager [phone]   If 7PM-7AM, please contact night-coverage [email or web address] Password [email or web address] 02/13/2013, 1:16 PM   LOS: 4 days

## 2013-02-13 NOTE — Clinical Social Work Psychosocial (Signed)
     Clinical Social Work Department BRIEF PSYCHOSOCIAL ASSESSMENT 02/13/2013  Patient:  Jason Walker, Jason Walker     Account Number:  [id number]     Admit date:  02/09/2013  Clinical Social Worker:  Verl Blalock  Date/Time:  02/13/2013 12:30 PM  Referred by:  Physician  Date Referred:  02/13/2013 Referred for  SNF Placement   Other Referral:   Interview type:  Family Other interview type:   Patient being transferred from 3100 to 3W    PSYCHOSOCIAL DATA Living Status:  WIFE Admitted from facility:   Level of care:   Primary support name:  [place]   [phone]   [phone] Primary support relationship to patient:  SPOUSE Degree of support available:   Strong    CURRENT CONCERNS Current Concerns  Post-Acute Placement   Other Concerns:   Patient awaiting possible inpatient rehab admission    SOCIAL WORK ASSESSMENT / PLAN Clinical Social Worker spoke with patient wife and daughter in [place] waiting room to offer support and discuss patient needs at discharge.  Patient wife states that prior to hospitalization patient was living with her and relatively independent.  Patient and patient wife would ultimately like to go to inpatient rehab if bed available once medically stable.  Patient family is agreeable to SNF back up search in [place], [place] with preference to [facility] and [place].  CSW has initiated search and will follow up with patient family regarding bed offers.  CSW remains available for emotional support and to facilitate patient discharge needs once medically ready.   Assessment/plan status:  Psychosocial Support/Ongoing Assessment of Needs Other assessment/ plan:   Information/referral to community resources:   Patient family utilized telephones and other local family members to look into appropriate facilities.  Patient wife would like to stay at bedside even after offered a slumber room.  Patient daughter able to go back and forth from [place] to assist patient  wife.    PATIENTS/FAMILYS RESPONSE TO PLAN OF CARE: Patient alert and oriented x3, however was in transit from 3100 therefore communicated with patient family.  Patient family states that patient will not go to a "nursing home," but would be agreeable to "rehab in the community."  CSW explained that the facilities are skilled nursing facilities and family is aware but prefers to use different language with patient.  Patient family desperately hoping for inpatient rehab placement.  Patient family verbalized their appreciation for CSW support and involvement.

## 2013-02-13 NOTE — Progress Notes (Signed)
Stroke Team Progress Note  HISTORY Jason Walker is an 79 y.o. male with known A-fib but not on AC due to previous life threating GI bleed, PVT s/p ICD, CKD. Patient presented to [facility] today 02-09-13 but transferred to [facility] due to lack of neurology coverage at [facility]. Patient was last seen normal at about 0100 am 02-09-13 when he went to bed. Wife found him the next morning about 6:15 am unresponsive and S/P vomiting. She also noted areas in room where he had previously gotten up in the night and been to kitchen. She was awoken at 6 by her husband when he tried to get out of the bed and fell. HE also vomited. Per family yesterday he had multiple episodes where he became diaphoretic and nonresponsive. Initial CT at [facility] was negative. Patient was not a TPA candidate at [facility] secondary to delay in arrival. He was admitted for further evaluation and treatment.  SUBJECTIVE Wife and daughter at bedside.More alert and interactive. No headaches or any complaints.  OBJECTIVE Most recent Vital Signs: Filed Vitals:   02/13/13 0400 02/13/13 0500 02/13/13 0600 02/13/13 0700  BP: 115/62 112/70 131/58 122/67  Pulse: 69 81 68 71  Temp:      TempSrc:      Resp: 15 19 15 17   Height:      Weight:      SpO2: 100% 94% 94% 92%   CBG (last 3)   Recent Labs  02/12/13 0759 02/12/13 1657 02/12/13 2204  GLUCAP 116* 201* 178*   IV Fluid Intake:   . sodium chloride 50 mL/hr at 02/13/13 0700   MEDICATIONS  . aspirin EC  81 mg Oral Daily  . carvedilol  12.5 mg Oral BID WC  . clopidogrel  75 mg Oral Q breakfast  . divalproex  500 mg Oral Daily  . doxazosin  4 mg Oral QHS  . enoxaparin (LOVENOX) injection  30 mg Subcutaneous Q24H  . insulin aspart  0-9 Units Subcutaneous TID WC  . insulin aspart  3 Units Subcutaneous TID WC  . insulin glargine  20 Units Subcutaneous QHS  . pantoprazole  40 mg Oral Daily  . ranolazine  500 mg Oral BID  . simvastatin  20 mg Oral QHS   PRN:   acetaminophen, acetaminophen, nitroGLYCERIN, ondansetron (ZOFRAN) IV, senna-docusate  Diet:  Dysphagia 3 nectar thick liquids Activity:   OOB with assistance DVT Prophylaxis:  Lovenox 40 mg sq daily   CLINICALLY SIGNIFICANT STUDIES Basic Metabolic Panel:  Recent Labs Lab 02/12/13 0349 02/13/13 0320  NA 136 136  K 4.0 3.8  CL 96 97  CO2 31 30  GLUCOSE 155* 124*  BUN 52* 46*  CREATININE 2.53* 2.32*  CALCIUM 8.8 8.7   Liver Function Tests:  Recent Labs Lab 02/09/13 2003  AST 22  ALT 14  ALKPHOS 66  BILITOT 0.5  PROT 8.3  ALBUMIN 3.9   CBC:  Recent Labs Lab 02/09/13 1645 02/13/13 0320  WBC 12.1* 5.3  HGB 12.7* 10.9*  HCT 37.3* 31.8*  MCV 87.1 85.9  PLT 189 120*   Coagulation: No results found for this basename: LABPROT, INR,  in the last 168 hours Cardiac Enzymes: No results found for this basename: CKTOTAL, CKMB, CKMBINDEX, TROPONINI,  in the last 168 hours Urinalysis: No results found for this basename: COLORURINE, APPERANCEUR, LABSPEC, PHURINE, GLUCOSEU, HGBUR, BILIRUBINUR, KETONESUR, PROTEINUR, UROBILINOGEN, NITRITE, LEUKOCYTESUR,  in the last 168 hours  Lipid Panel    Component Value Date/Time  CHOL 168 02/10/2013 0500   TRIG 150* 02/10/2013 0500   HDL 35* 02/10/2013 0500   CHOLHDL 4.8 02/10/2013 0500   VLDL 30 02/10/2013 0500   LDLCALC 103* 02/10/2013 0500   HgbA1C  Lab Results  Component Value Date   HGBA1C 10.0* 02/10/2013   Urine Drug Screen:   No results found for this basename: labopia,  cocainscrnur,  labbenz,  amphetmu,  thcu,  labbarb    Alcohol Level: No results found for this basename: ETH,  in the last 168 hours  US Abdomen Complete  02/10/2013   1.  7 mm gallstone which is impacted in the gallbladder neck.  No sonographic evidence of acute cholecystitis. 2.  No biliary ductal dilation. 3.  Normal-appearing pancreatic head and proximal body.  The distal body and tail were obscured by overlying bowel gas. 3.  Atrophic left kidney.  3 cm simple  cyst in the upper pole of the otherwise normal-appearing right kidney.     CT of the brain  02/11/2013  Stable appearing left cerebellar infarct causing extensive cytotoxic edema.  There is localized mass effect on the fourth ventricle, stable.  There is no new infarct compared to recent prior study.  No acute hemorrhage.  Underlying atrophy.    CT Angio Head 02/10/2013  Moderate to large left cerebellar infarct with local mass effect. Presently no associated hemorrhage or hydrocephalus currently.  Although there is narrowing of portions of the vertebral arteries and basilar artery, no high-grade stenosis or thrombus identified.  Portions of the PICAs are noted bilaterally.  Portions of the AICA visualized bilaterally. Left AICA only noted proximally with poor delineation of the distal branches.  Mild to moderate narrowing of the cavernous segment of the internal carotid artery bilaterally.    MRI/A of the brain  pacer  2D Echocardiogram  The estimated ejection fraction was 35%. Diffuse hypokinesis. There was a reduced contribution of atrial contraction to ventricular filling, due to increased ventricular diastolic pressure or atrial contractile dysfunction.  Carotid Doppler  No significant extracranial carotid artery stenosis demonstrated. Atypical vertebral flow bilaterally suggesting proximal obstruction.  CXR  02/10/2013   Shallow inspiration with mild congestive changes.  No edema or consolidation.     EKG  atrial fibrillation, paced rhythm  Therapy Recommendations CIR  Physical Exam   Elderly Caucasian male not in distress.Awake alert. Afebrile. Head is nontraumatic. Neck is supple without bruit. Hearing is normal. Cardiac exam no murmur or gallop. Lungs are clear to auscultation. Distal pulses are well felt. Neurological Exam : awake alert dysarthric but can be understood with some difficulty. No aphasia or apraxia. Pupils equal reactive. Fundi were not visualized. Vision acuity and fields  appear normal. Extraocular moments are full range but there is slightly decreased but reactive the left with few beats of sustained nystagmus on left lateral gaze. Face is symmetric tongue is midline. Cough and gag week. This no upper or lower expected drift but there is mild left finger-to-nose dysmetria with slight weakness of the left intrinsic hand muscles. Orbits right over left approximately. Lower extremity exam shows symmetric strength and coordination. Sensation is intact. Plantars are downgoing. Gait was not tested.  ASSESSMENT [name] is a 79 y.o. male found unresponsive, he had been vomiting. Imaging confirms a large left cerebellar embolic infarct with cytotoxic cerebral edema with mild hydrocephalus, suspect left brainstem infarct not well seen on CT. Infarcts felt to be embolic secondary to known atrial fibrillation.  Not a coumadin candidate secondary to  hx previous GI bleeding in Oct 2013 requiring 7 unit transfusions, at high risk for rebleeding per gastroenterologist ( Dr Samuella Cota in [place], [place]). Therefore, on aspirin 81 mg orally every day and clopidogrel 75 mg orally every day prior to admission.  Now on aspirin 81 mg orally every day and clopidogrel 75 mg orally every day for secondary stroke prevention. Patient with resultant lethargy, nausea, vomiting, headache, dysarthria, visual abnormalities, dizziness, dysphagia, eye movement abnormalities. Overall improving, stable for transfer to the floor. CIR following for possible admission.   Hypertension Hyperlipidemia, LDL 103, on statin PTA, on statin now, goal LDL < 100 Diabetes, HgbA1c 10.0  atrial fibrillation Headache. Placed on depakote. Headache improving. Pacer/defibrillator CAD - MI Hx GIB Oct 2013, at high risk for rebleed per GI CKD, stage IV. Improving Cr up, down to 2.32. Goal Cr 2-3 per nephrologist per wife.  Hospital day # 4  TREATMENT/PLAN  Ok for transfer to the floor from neuro standpoint  Upgrade  diet to Regular, thin liquid renal diet as per ST  Rehab following for possible admission  Continue aspirin 81 mg orally every day and clopidogrel 75 mg orally every day for secondary stroke prevention. Not an anticoagulation candidate secondary to hx GIB.  Dr. Pearlean Brownie discussed with wife, daughter and patient as well as family friend Baird Lyons, who is a neuro ICU RN here at [employer].  Annie Main, MSN, RN, ANVP-BC, ANP-BC, [facility] Pager: [phone] 02/13/2013 8:22 AM  I have personally obtained a history, examined the patient, evaluated imaging results, and formulated the assessment and plan of care. I agree with the above.  Delia Heady, MD

## 2013-02-13 NOTE — Progress Notes (Signed)
Referral received today for possible CIR or SNF placement. FL2 is completed, no pasarr needed for [place], CSW [place] [facility]. Family's preference if pt goes to SNF will be for the [place], [place] area.  CSW will continue to follow the pt.  Sherald Barge, LCSW-A Clinical Social Worker [phone]

## 2013-02-13 NOTE — PMR Pre-admission (Signed)
PMR Admission Coordinator Pre-Admission Assessment  Patient: Jason Walker is an 79 y.o., male MRN: [id number] DOB: [birth year] Height: 5\' 6"  (167.6 cm) Weight: 85.684 kg (188 lb 14.4 oz)              Insurance Information HMO:     PPO:       PCP:       IPA:       80/20:       OTHER:  yes PRIMARY: Medicare      Policy#: [id number]      Subscriber: pt      Employer: retired Benefits:  Phone #:       Name: Armed forces technical officer. Date: 11/27/89     Deduct: $1216.00      Out of Pocket Max:        Life Max:   CIR: 100% after deduct      SNF: 100% days 1-20; $152.00/day days 21-100 Outpatient: 80%     Co-Pay: 20% Home Health: 100%      Co-Pay: 20% equipment used DME: 80%     Co-Pay: 20% Providers: pt's choice [MC D=[employer] MCRx] SECONDARY: Generic [employer] [Medicare supplement plan F]     Policy#: [id number]      Subscriber: pt    Emergency Contact Information Contact Information   Name Relation Home Work Mobile   [place] Other [phone]     Current Medical History  Patient Admitting Diagnosis:  left cerbellar infarct with edema/mass effect  History of Present Illness:   79 y.o. male with history of DM, CAD, A-Fib but no anticoagulation due to life threating bleed, ICD; who was admitted to [facility] on 02/09/13 with N/V, fall and unresponsiveness. Initial CCT negative. [facility]. 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 Abdominal U/S done due to elevated amylase and CKD. This revealed 7 mm gallstone, no cholecystitis and atrophic left kidney. Carotid dopplers without ICA stenosis. 2D echo with EF 35% with diffuse hypokinesis and mild-moderate MVR. Neurology recommended continuing ASA/Plavix and close monitoring as at risk for hydrocephalus and increased ICP. Follow up CCT 03/18 with stable L-cerebellar infarct with extensive cytotoxic edema and mass effect.  Placed on Depakote for headaches. Patient continues Dizziness with balance and gaze disturbances. Mentation improving.    Total: 5=NIH   Past Medical History  Past Medical History  Diagnosis Date  . RBBB (right bundle branch block)   . AAA (abdominal aortic aneurysm)   . AMI (acute myocardial infarction)   . Diabetes mellitus   . Coronary artery disease   . Pacemaker   . Defibrillator activation   . Hypertension   . Heart disease   . Heart attack   . Wears dentures   . Hyperlipidemia   . Shortness of breath   . Kidney disease   . Hyperpotassemia   . Edema     Family History  family history includes Cancer in his brother; Diabetes in his mother; Heart disease in his brother and mother; Hypertension in his other; and Stroke in his other.  Prior Rehab/Hospitalizations: OP therapy after bilateral rotator cuff surgeries   Current Medications  Current facility-administered medications:0.9 %  sodium chloride infusion, , Intravenous, Continuous, Lonia Blood, MD, Last Rate: 50 mL/hr at 02/13/13 1200;  acetaminophen (TYLENOL) suppository 650 mg, 650 mg, Rectal, Q4H PRN, Henderson Cloud, MD;  acetaminophen (TYLENOL) tablet 650 mg, 650 mg,  Oral, Q4H PRN, Henderson Cloud, MD, 650 mg at 02/10/13 1511 aspirin EC tablet 81 mg, 81 mg, Oral, Daily, Henderson Cloud, MD, 81 mg at 02/13/13 0944;  carvedilol (COREG) tablet 12.5 mg, 12.5 mg, Oral, BID WC, Henderson Cloud, MD, 12.5 mg at 02/14/13 0807;  clopidogrel (PLAVIX) tablet 75 mg, 75 mg, Oral, Q breakfast, Henderson Cloud, MD, 75 mg at 02/14/13 4098;  divalproex (DEPAKOTE ER) 24 hr tablet 500 mg, 500 mg, Oral, Daily, Layne Benton, NP, 500 mg at 02/13/13 0944 doxazosin (CARDURA) tablet 4 mg, 4 mg, Oral, QHS, Henderson Cloud, MD, 4 mg at 02/13/13 2335;  enoxaparin (LOVENOX) injection 30 mg, 30 mg, Subcutaneous, Q24H, Kimberly Ballard Hammons, RPH, 30 mg at 02/13/13 2337;  insulin aspart  (novoLOG) injection 0-9 Units, 0-9 Units, Subcutaneous, TID WC, Estela Isaiah Blakes, MD, 3 Units at 02/14/13 0810 insulin aspart (novoLOG) injection 3 Units, 3 Units, Subcutaneous, TID WC, Henderson Cloud, MD, 3 Units at 02/14/13 [phone];  insulin glargine (LANTUS) injection 20 Units, 20 Units, Subcutaneous, QHS, Henderson Cloud, MD, 20 Units at 02/13/13 2337;  nitroGLYCERIN (NITROSTAT) SL tablet 0.4 mg, 0.4 mg, Sublingual, Q5 min PRN, Henderson Cloud, MD ondansetron [facility]) injection 4 mg, 4 mg, Intravenous, Q4H PRN, Rolan Lipa, NP, 4 mg at 02/11/13 0801;  pantoprazole (PROTONIX) EC tablet 40 mg, 40 mg, Oral, Daily, Henderson Cloud, MD, 40 mg at 02/13/13 0944;  ranolazine (RANEXA) 12 hr tablet 500 mg, 500 mg, Oral, BID, Henderson Cloud, MD, 500 mg at 02/13/13 2335 senna-docusate (Senokot-S) tablet 1 tablet, 1 tablet, Oral, QHS PRN, Henderson Cloud, MD, 1 tablet at 02/11/13 2149;  senna-docusate (Senokot-S) tablet 1 tablet, 1 tablet, Oral, BID, Kathlen Mody, MD;  simvastatin (ZOCOR) tablet 20 mg, 20 mg, Oral, QHS, Henderson Cloud, MD, 20 mg at 02/13/13 2335  Patients Current Diet:  Regular, thin  He wears dentures  Precautions / Restrictions Precautions Precautions: Fall Precaution Comments: decreased sitting and standing balance Restrictions Weight Bearing Restrictions: No  Pt very hard of hearing-speak to him face to face Prior Activity Level  Independent, but, wife says he had slowed down some after January pneumonia, and, was starting to improve when he had the stroke. Home Assistive Devices / Equipment Home Assistive Devices/Equipment: CBG Meter;Oxygen;Eyeglasses Home Adaptive Equipment: Built-in shower seat  Prior Functional Level Prior Function Level of Independence: Independent Able to Take Stairs?: Yes Driving: Yes (occ. drove per wife, but, did not drive at night) Vocation: Retired  Current  Functional Level Cognition  Arousal/Alertness: Lethargic Overall Cognitive Status: Impaired Overall Cognitive Status: Difficult to assess Difficult to assess due to: Level of arousal Orientation Level: Oriented X4 Cognition - Other Comments: HOH with cueing to problem solve and self correct Attention: Focused Focused Attention: Impaired Focused Attention Impairment: Verbal basic;Functional basic (due to lethargy?) Memory:  (needs continued dx; recognizes family members) Comments: needs continued differential diagnosis due to lethargy    Extremity Assessment (includes Sensation/Coordination)  RUE ROM/Strength/Tone: Unable to fully assess (grossly 4/5, low endurance) RUE Sensation:  (intact temperature and gross touch)  RLE ROM/Strength/Tone: Unable to fully assess;Deficits (generalized weakness) RLE ROM/Strength/Tone Deficits: limited by lethergy but appers to have weakness compared to L RLE Sensation: WFL - Light Touch RLE Coordination: Deficits RLE Coordination Deficits: most likely due to lethargy    ADLs  Eating/Feeding: +1 Total assistance Where Assessed - Eating/Feeding: Bed level Grooming: +  1 Total assistance Where Assessed - Grooming: Supported sitting Upper Body Bathing: +1 Total assistance Where Assessed - Upper Body Bathing: Supported sitting Lower Body Bathing: +1 Total assistance Where Assessed - Lower Body Bathing: Supported sitting Upper Body Dressing: +1 Total assistance Where Assessed - Upper Body Dressing: Supported sitting Lower Body Dressing: +1 Total assistance Where Assessed - Lower Body Dressing: Unsupported sitting Toilet Transfer: +2 Total assistance Toilet Transfer: Patient Percentage: 80% Toilet Transfer Method: Sit to Barista: Regular height toilet Equipment Used: Gait belt Transfers/Ambulation Related to ADLs: incr bed mobility, sit<>stand with balance deficits. Pt with tactile cues and verbal cues to correct posture.  ADL  Comments: Pt alert and aroused on arrival. Pt tracking therapist in room. pt demonstrates dysphagia . Pt exiting bed on Rt side with verbal cues for sequence and min tactile cue. Pt sitting EOB . Pt demonstrates Lt ribs closed  / Rt ribs open with Lt side hypotoxicity. Pt demonstrates inititaion of trunk extensors and able to sustain for ~ before requiring incr tactle cue or verbal cue. Pt provided tactile input for scapular depression and verbal cue for neck extension. Pt following simple 1 step commands consistently with quick response      Mobility  Bed Mobility: Rolling Left;Left Sidelying to Sit;Sitting - Scoot to [place] of Bed;Sit to Sidelying Left Rolling Left: 1: +2 Total assist Rolling Left: Patient Percentage: 30% Left Sidelying to Sit: 1: +2 Total assist Left Sidelying to Sit: Patient Percentage: 30% Supine to Sit: 4: Min assist;With rails;HOB elevated Sitting - Scoot to Edge of Bed: 4: Min assist;With rail Sitting - Scoot to [place] of Bed: Patient Percentage: 30% Sit to Sidelying Left: 1: +2 Total assist Sit to Sidelying Left: Patient Percentage: 20%    Transfers  Transfers: Sit to Stand;Stand to Sit Sit to Stand: 1: +2 Total assist;With upper extremity assist;From bed Sit to Stand: Patient Percentage: 80% Stand to Sit: 1: +2 Total assist;With upper extremity assist;To chair/3-in-1;With armrests Stand to Sit: Patient Percentage: 80%    Ambulation / Gait / Stairs / Wheelchair Mobility  Ambulation/Gait Ambulation/Gait Assistance: 1: +2 Total assist Ambulation/Gait: Patient Percentage: 70% Ambulation Distance (Feet): 5 Feet Assistive device: 2 person hand held assist Ambulation/Gait Assistance Details: 2 person assist to support trunk for balance,  Step-by-step instructions for gait sequencing.  Pt again, leaning to the left and anteriorly with gait.  Cues needed for upright and midline posture while stepping.   Gait Pattern: Narrow base of support;Step-through  pattern;Lateral trunk lean to left;Trunk flexed Gait velocity: less than 1.8 ft/sec which indicates risk for recurrent falls Wheelchair Mobility Wheelchair Mobility: No    Posture / Balance Static Sitting Balance Static Sitting - Balance Support: Bilateral upper extremity supported;Feet supported Static Sitting - Level of Assistance: 4: Min assist Static Sitting - Comment/# of Minutes: min assist to provide tactile cues to find midline posture. Sat EOB 10 mins working on finding midline, upright posture. reaching outside of BOS.  Static Standing Balance Static Standing - Balance Support: Bilateral upper extremity supported Static Standing - Level of Assistance: 1: +2 Total assist Static Standing - Comment/# of Minutes:   once moved away from the bed pt had immediate left and posterior lean in standing. Needed verbal and manual cues to find midline, but when asked to self correct he would tend to over correct and go anterior and right. In standing worked on upright posture as well.      Special needs/care consideration BiPAP/CPAP no CPM  no Continuous Drip IV no Dialysis no        Life Vest no Oxygen no Special Bed no Trach Size no Wound Vac (area) no       Skin   No problems noted                            Bowel mgmt: LBM 02/08/13 Bladder mgmt: condom cath Diabetic mgmt yes     Previous Home Environment Living Arrangements: Spouse/significant other Lives With: Spouse Available Help at Discharge: Family;Available PRN/intermittently Type of Home: House Home Layout: One level Home Access: Level entry Bathroom Shower/Tub: Walk-in shower;Tub/shower unit;Curtain Bathroom Toilet: Handicapped height Bathroom Accessibility: Yes How Accessible: Accessible via walker Home Care Services: No  Discharge Living Setting Plans for Discharge Living Setting: Patient's home Do you have any problems obtaining your medications?: No  Social/Family/Support Systems Patient Roles:  Spouse;Parent Contact Information: h# [phone]; cell# [phone] Anticipated Caregiver: wife (& other family)  Wife works about 32hrs/week-hrs vary. Anticipated Caregiver's Contact Information: cell# [phone] Ability/Limitations of Caregiver: S-Min A Caregiver Availability: 24/7 Discharge Plan Discussed with Primary Caregiver: Yes Is Caregiver In Agreement with Plan?: Yes Does Caregiver/Family have Issues with Lodging/Transportation while Pt is in Rehab?: No  Goals/Additional Needs Patient/Family Goal for Rehab: S-Min A Expected length of stay: @ 2 weeks Pt/Family Agrees to Admission and willing to participate: Yes Program Orientation Provided & Reviewed with Pt/Caregiver Including Roles  & Responsibilities: Yes  Decrease burden of Care through IP rehab admission: Specialzed equipment needs and Patient/family education  Possible need for SNF placement upon discharge: not expected  Patient Condition: This patient's condition remains as documented in the Consult dated 02/12/13, in which the Rehabilitation Physician determined and documented that the patient's condition is appropriate for intensive rehabilitative care in an inpatient rehabilitation facility.  Preadmission Screen Completed By:  [name] Dare, 02/14/2013 9:51 AM ______________________________________________________________________   Discussed status with Dr. Riley Kill on 02/14/13 at [phone] and received telephone approval for admission today.  Admission Coordinator:  [name] Dare, time 0948/Date 02/14/13

## 2013-02-14 ENCOUNTER — Encounter (HOSPITAL_COMMUNITY): Payer: Self-pay | Admitting: Physical Medicine and Rehabilitation

## 2013-02-14 ENCOUNTER — Telehealth: Payer: Self-pay

## 2013-02-14 ENCOUNTER — Inpatient Hospital Stay (HOSPITAL_COMMUNITY)
Admission: RE | Admit: 2013-02-14 | Discharge: 2013-02-25 | DRG: 945 | Disposition: A | Discharge status: Home Health | Payer: Medicare Other | Source: Intra-hospital | Attending: Physical Medicine & Rehabilitation | Admitting: Physical Medicine & Rehabilitation

## 2013-02-14 DIAGNOSIS — I509 Heart failure, unspecified: Secondary | ICD-10-CM

## 2013-02-14 DIAGNOSIS — I6789 Other cerebrovascular disease: Secondary | ICD-10-CM

## 2013-02-14 DIAGNOSIS — Z833 Family history of diabetes mellitus: Secondary | ICD-10-CM

## 2013-02-14 DIAGNOSIS — I2589 Other forms of chronic ischemic heart disease: Secondary | ICD-10-CM

## 2013-02-14 DIAGNOSIS — I472 Ventricular tachycardia: Secondary | ICD-10-CM

## 2013-02-14 DIAGNOSIS — I129 Hypertensive chronic kidney disease with stage 1 through stage 4 chronic kidney disease, or unspecified chronic kidney disease: Secondary | ICD-10-CM

## 2013-02-14 DIAGNOSIS — Z823 Family history of stroke: Secondary | ICD-10-CM

## 2013-02-14 DIAGNOSIS — Z951 Presence of aortocoronary bypass graft: Secondary | ICD-10-CM

## 2013-02-14 DIAGNOSIS — I5022 Chronic systolic (congestive) heart failure: Secondary | ICD-10-CM | POA: Diagnosis present

## 2013-02-14 DIAGNOSIS — Z5189 Encounter for other specified aftercare: Principal | ICD-10-CM

## 2013-02-14 DIAGNOSIS — E119 Type 2 diabetes mellitus without complications: Secondary | ICD-10-CM

## 2013-02-14 DIAGNOSIS — K5900 Constipation, unspecified: Secondary | ICD-10-CM

## 2013-02-14 DIAGNOSIS — I2510 Atherosclerotic heart disease of native coronary artery without angina pectoris: Secondary | ICD-10-CM

## 2013-02-14 DIAGNOSIS — I4891 Unspecified atrial fibrillation: Secondary | ICD-10-CM

## 2013-02-14 DIAGNOSIS — R471 Dysarthria and anarthria: Secondary | ICD-10-CM

## 2013-02-14 DIAGNOSIS — R110 Nausea: Secondary | ICD-10-CM

## 2013-02-14 DIAGNOSIS — I252 Old myocardial infarction: Secondary | ICD-10-CM

## 2013-02-14 DIAGNOSIS — N184 Chronic kidney disease, stage 4 (severe): Secondary | ICD-10-CM

## 2013-02-14 DIAGNOSIS — Z95810 Presence of automatic (implantable) cardiac defibrillator: Secondary | ICD-10-CM

## 2013-02-14 DIAGNOSIS — E785 Hyperlipidemia, unspecified: Secondary | ICD-10-CM

## 2013-02-14 DIAGNOSIS — I639 Cerebral infarction, unspecified: Secondary | ICD-10-CM | POA: Diagnosis present

## 2013-02-14 DIAGNOSIS — R51 Headache: Secondary | ICD-10-CM

## 2013-02-14 DIAGNOSIS — I6330 Cerebral infarction due to thrombosis of unspecified cerebral artery: Secondary | ICD-10-CM

## 2013-02-14 DIAGNOSIS — G936 Cerebral edema: Secondary | ICD-10-CM

## 2013-02-14 DIAGNOSIS — Z87891 Personal history of nicotine dependence: Secondary | ICD-10-CM

## 2013-02-14 DIAGNOSIS — Z8249 Family history of ischemic heart disease and other diseases of the circulatory system: Secondary | ICD-10-CM

## 2013-02-14 LAB — GLUCOSE, CAPILLARY
Glucose-Capillary: 134 mg/dL — ABNORMAL HIGH (ref 70–99)
Glucose-Capillary: 116 mg/dL — ABNORMAL HIGH (ref 70–99)

## 2013-02-14 LAB — URINALYSIS, ROUTINE W REFLEX MICROSCOPIC
Bilirubin Urine: NEGATIVE
Hgb urine dipstick: NEGATIVE
Ketones, ur: NEGATIVE mg/dL
Nitrite: NEGATIVE
Urobilinogen, UA: 0.2 mg/dL (ref 0.0–1.0)

## 2013-02-14 LAB — URINE MICROSCOPIC-ADD ON

## 2013-02-14 MED ORDER — SENNOSIDES-DOCUSATE SODIUM 8.6-50 MG PO TABS
1.0000 | ORAL_TABLET | Freq: Two times a day (BID) | ORAL | Status: DC
  Administered 2013-02-14 – 2013-02-25 (×17): 1 via ORAL
  Filled 2013-02-14 (×21): qty 1

## 2013-02-14 MED ORDER — DIVALPROEX SODIUM ER 500 MG PO TB24: 500.0000 mg | ORAL_TABLET | Freq: Every day | ORAL | Status: DC

## 2013-02-14 MED ORDER — ENOXAPARIN SODIUM 30 MG/0.3ML ~~LOC~~ SOLN
30.0000 mg | SUBCUTANEOUS | Status: DC
  Administered 2013-02-14 – 2013-02-24 (×11): 30 mg via SUBCUTANEOUS
  Filled 2013-02-14 (×12): qty 0.3

## 2013-02-14 MED ORDER — ASPIRIN EC 81 MG PO TBEC
81.0000 mg | DELAYED_RELEASE_TABLET | Freq: Every day | ORAL | Status: DC
  Administered 2013-02-15 – 2013-02-25 (×11): 81 mg via ORAL
  Filled 2013-02-14 (×12): qty 1

## 2013-02-14 MED ORDER — LISINOPRIL 40 MG PO TABS: 20.0000 mg | ORAL_TABLET | Freq: Every day | ORAL | Status: DC

## 2013-02-14 MED ORDER — RANOLAZINE ER 500 MG PO TB12
500.0000 mg | ORAL_TABLET | Freq: Two times a day (BID) | ORAL | Status: DC
  Administered 2013-02-14 – 2013-02-25 (×22): 500 mg via ORAL
  Filled 2013-02-14 (×24): qty 1

## 2013-02-14 MED ORDER — FLEET ENEMA 7-19 GM/118ML RE ENEM
1.0000 | ENEMA | Freq: Once | RECTAL | Status: AC | PRN
  Administered 2013-02-14: 1 via RECTAL
  Filled 2013-02-14: qty 1

## 2013-02-14 MED ORDER — SIMVASTATIN 20 MG PO TABS
20.0000 mg | ORAL_TABLET | Freq: Every day | ORAL | Status: DC
  Administered 2013-02-14 – 2013-02-24 (×11): 20 mg via ORAL
  Filled 2013-02-14 (×13): qty 1

## 2013-02-14 MED ORDER — INSULIN ASPART 100 UNIT/ML ~~LOC~~ SOLN: 0.0000 [IU] | Freq: Three times a day (TID) | SUBCUTANEOUS | Status: AC

## 2013-02-14 MED ORDER — PROCHLORPERAZINE 25 MG RE SUPP
12.5000 mg | Freq: Four times a day (QID) | RECTAL | Status: DC | PRN
  Filled 2013-02-14: qty 1

## 2013-02-14 MED ORDER — PANTOPRAZOLE SODIUM 40 MG PO TBEC
40.0000 mg | DELAYED_RELEASE_TABLET | Freq: Every day | ORAL | Status: DC
  Administered 2013-02-15 – 2013-02-25 (×11): 40 mg via ORAL
  Filled 2013-02-14 (×9): qty 1

## 2013-02-14 MED ORDER — DOXAZOSIN MESYLATE 4 MG PO TABS
4.0000 mg | ORAL_TABLET | Freq: Every day | ORAL | Status: DC
  Administered 2013-02-14 – 2013-02-24 (×11): 4 mg via ORAL
  Filled 2013-02-14 (×12): qty 1

## 2013-02-14 MED ORDER — FLEET ENEMA 7-19 GM/118ML RE ENEM: 1.0000 | ENEMA | Freq: Once | RECTAL | Status: DC

## 2013-02-14 MED ORDER — INSULIN ASPART 100 UNIT/ML ~~LOC~~ SOLN
3.0000 [IU] | Freq: Three times a day (TID) | SUBCUTANEOUS | Status: DC
  Administered 2013-02-14 – 2013-02-18 (×9): 3 [IU] via SUBCUTANEOUS

## 2013-02-14 MED ORDER — CARVEDILOL 12.5 MG PO TABS
12.5000 mg | ORAL_TABLET | Freq: Two times a day (BID) | ORAL | Status: DC
  Administered 2013-02-14 – 2013-02-25 (×22): 12.5 mg via ORAL
  Filled 2013-02-14 (×24): qty 1

## 2013-02-14 MED ORDER — INSULIN GLARGINE 100 UNIT/ML ~~LOC~~ SOLN
20.0000 [IU] | Freq: Every day | SUBCUTANEOUS | Status: DC
  Administered 2013-02-14 – 2013-02-18 (×5): 20 [IU] via SUBCUTANEOUS
  Filled 2013-02-14 (×6): qty 0.2

## 2013-02-14 MED ORDER — ALUM & MAG HYDROXIDE-SIMETH 200-200-20 MG/5ML PO SUSP: 30.0000 mL | ORAL | Status: DC | PRN

## 2013-02-14 MED ORDER — BISACODYL 10 MG RE SUPP
10.0000 mg | Freq: Every day | RECTAL | Status: DC | PRN
  Administered 2013-02-16: 10 mg via RECTAL
  Filled 2013-02-14: qty 1

## 2013-02-14 MED ORDER — NITROGLYCERIN 0.4 MG SL SUBL: 0.4000 mg | SUBLINGUAL_TABLET | SUBLINGUAL | Status: DC | PRN

## 2013-02-14 MED ORDER — SENNOSIDES-DOCUSATE SODIUM 8.6-50 MG PO TABS: 1.0000 | ORAL_TABLET | Freq: Two times a day (BID) | ORAL | Status: DC

## 2013-02-14 MED ORDER — CLOPIDOGREL BISULFATE 75 MG PO TABS
75.0000 mg | ORAL_TABLET | Freq: Every day | ORAL | Status: DC
  Administered 2013-02-15 – 2013-02-25 (×11): 75 mg via ORAL
  Filled 2013-02-14 (×12): qty 1

## 2013-02-14 MED ORDER — GUAIFENESIN-DM 100-10 MG/5ML PO SYRP: 5.0000 mL | ORAL_SOLUTION | Freq: Four times a day (QID) | ORAL | Status: DC | PRN

## 2013-02-14 MED ORDER — ACETAMINOPHEN 325 MG PO TABS
650.0000 mg | ORAL_TABLET | Freq: Four times a day (QID) | ORAL | Status: DC | PRN
  Administered 2013-02-14: 650 mg via ORAL
  Filled 2013-02-14: qty 2

## 2013-02-14 MED ORDER — DIPHENHYDRAMINE HCL 12.5 MG/5ML PO ELIX: 12.5000 mg | ORAL_SOLUTION | Freq: Four times a day (QID) | ORAL | Status: DC | PRN

## 2013-02-14 MED ORDER — PROCHLORPERAZINE EDISYLATE 5 MG/ML IJ SOLN
5.0000 mg | Freq: Four times a day (QID) | INTRAMUSCULAR | Status: DC | PRN
  Filled 2013-02-14: qty 2

## 2013-02-14 MED ORDER — DIVALPROEX SODIUM ER 250 MG PO TB24
250.0000 mg | ORAL_TABLET | Freq: Every day | ORAL | Status: DC
  Administered 2013-02-15 – 2013-02-24 (×10): 250 mg via ORAL
  Filled 2013-02-14 (×11): qty 1

## 2013-02-14 MED ORDER — SENNOSIDES-DOCUSATE SODIUM 8.6-50 MG PO TABS
1.0000 | ORAL_TABLET | Freq: Two times a day (BID) | ORAL | Status: DC
  Administered 2013-02-14: 1 via ORAL
  Filled 2013-02-14 (×2): qty 1

## 2013-02-14 MED ORDER — INSULIN ASPART 100 UNIT/ML ~~LOC~~ SOLN
0.0000 [IU] | Freq: Three times a day (TID) | SUBCUTANEOUS | Status: DC
  Administered 2013-02-15: 0 [IU] via SUBCUTANEOUS
  Administered 2013-02-16: 1 [IU] via SUBCUTANEOUS
  Administered 2013-02-16 – 2013-02-17 (×2): 2 [IU] via SUBCUTANEOUS
  Administered 2013-02-17: 1 [IU] via SUBCUTANEOUS

## 2013-02-14 MED ORDER — PROCHLORPERAZINE MALEATE 5 MG PO TABS
5.0000 mg | ORAL_TABLET | Freq: Four times a day (QID) | ORAL | Status: DC | PRN
  Filled 2013-02-14: qty 2

## 2013-02-14 MED ORDER — TRAZODONE HCL 50 MG PO TABS: 25.0000 mg | ORAL_TABLET | Freq: Every evening | ORAL | Status: DC | PRN

## 2013-02-14 MED ORDER — POLYETHYLENE GLYCOL 3350 17 G PO PACK: 17.0000 g | PACK | Freq: Every day | ORAL | Status: DC | PRN

## 2013-02-14 NOTE — Progress Notes (Signed)
TO MD on rounds, Pt last known bowel movement was last Saturday, can the pat have a laxative? Thanks Ancil Linsey RN

## 2013-02-14 NOTE — H&P (View-Only) (Signed)
Physical Medicine and Rehabilitation Admission H&P    CC: Dizziness, balance deficits, gaze instability.  HPI: Jason Walker is a 79 y.o. male with history of DM, CAD, A-Fib but no anticoagulation due to life threating GIB, ICD; who was admitted to [facility] on 02/09/13 with N/V, fall and unresponsiveness. Initial CCT negative. [facility]. 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 Abdominal U/S done due to elevated amylase and CKD. This revealed 7 mm gallstone, no cholecystitis and atrophic left kidney. Carotid dopplers without ICA stenosis. 2D echo with EF 35% with diffuse hypokinesis and mild-moderate MVR. Neurology recommended continuing ASA/Plavix and close monitoring as at risk for hydrocephalus and increased ICP. Follow up CCT 03/18 with stable L-cerebellar infarct with extensive cytotoxic edema and mass effect. Placed on Depakote for headaches. Patient continues Dizziness with balance and gaze disturbances. Mentation improving   Review of Systems  Eyes: Positive for blurred vision (visual deficits since stroke. ).  Respiratory: Positive for shortness of breath (with exertion. ).   Cardiovascular:       History of angina-had an episode earlier this month.  Gastrointestinal: Positive for constipation. Negative for heartburn and nausea.  Genitourinary: Positive for urgency and frequency (gets up 4-5 times at night due to diuretics. ).  Musculoskeletal: Negative for myalgias.  Neurological: Positive for dizziness, speech change, focal weakness and headaches.   Past Medical History  Diagnosis Date  . RBBB (right bundle branch block)   . AAA (abdominal aortic aneurysm)   . AMI (acute myocardial infarction)   . Diabetes mellitus   . Coronary artery disease   . Pacemaker   . Defibrillator activation   . Hypertension   . Heart disease   . Heart attack   . Wears dentures   .  Hyperlipidemia   . Shortness of breath   . Kidney disease   . Hyperpotassemia   . Edema     BLE  . Systolic CHF    Past Surgical History  Procedure Laterality Date  . Coronary artery bypass graft  1990  . Icd  2011  . Hernia repair      umbilical and RIH  . Aneurysm coiling    . Rotator cuff repair      bilateral  . Lasik      with lens implant.    Family History  Problem Relation Age of Onset  . Heart disease Mother   . Diabetes Mother   . Cancer Brother     colon  . Heart disease Brother     heart attack  . Stroke Other   . Hypertension Other    Social History: Married. Retired from [employer]. Independent PTA. Per reports  reports that he quit smoking about 25 years ago. His smoking use included Cigarettes. He smoked 0.00 packs per day for 30 years. He has never used smokeless tobacco. He reports that  drinks alcohol. He reports that he does not use illicit drugs.   Allergies  Allergen Reactions  . Lorazepam   . Morphine And Related    Medications Prior to Admission  Medication Sig Dispense Refill  . aspirin 81 MG tablet Take 81 mg by mouth daily.      . carvedilol (COREG) 12.5 MG tablet Take 1 tablet (12.5 mg total) by mouth 2 (two) times daily.  180 tablet  3  . cholecalciferol (VITAMIN D) 1000 UNITS tablet Take   1,000 Units by mouth daily.      . clopidogrel (PLAVIX) 75 MG tablet Take 75 mg by mouth daily.      . doxazosin (CARDURA) 4 MG tablet Take 4 mg by mouth at bedtime.        . furosemide (LASIX) 40 MG tablet Take 1 tablet (40 mg total) by mouth 2 (two) times daily.  30 tablet    . insulin glargine (LANTUS) 100 UNIT/ML injection Inject 35 Units into the skin at bedtime.        . nitroGLYCERIN (NITROSTAT) 0.4 MG SL tablet Place 0.4 mg under the tongue every 5 (five) minutes as needed.        . omeprazole (PRILOSEC) 20 MG capsule Take 20 mg by mouth daily.       . ranolazine (RANEXA) 500 MG 12 hr tablet Take 500 mg by mouth 2 (two) times daily.        .  simvastatin (ZOCOR) 40 MG tablet Take 20 mg by mouth at bedtime.       . [DISCONTINUED] lisinopril (PRINIVIL,ZESTRIL) 40 MG tablet Take 40 mg by mouth daily.        . [DISCONTINUED] metFORMIN (GLUCOPHAGE) 1000 MG tablet Take 1,000 mg by mouth 2 (two) times daily.          Home: Home Living Lives With: Spouse Available Help at Discharge: Family;Available PRN/intermittently Type of Home: House Home Access: Level entry Home Layout: One level Bathroom Shower/Tub: Walk-in shower;Tub/shower unit;Curtain Bathroom Toilet: Handicapped height Bathroom Accessibility: Yes How Accessible: Accessible via walker Home Adaptive Equipment: Built-in shower seat   Functional History: Prior Function Able to Take Stairs?: Yes Driving: Yes (occ. drove per wife, but, did not drive at night) Vocation: Retired  Functional Status:  Mobility: Bed Mobility Bed Mobility: Rolling Right;Right Sidelying to Sit;Sitting - Scoot to Edge of Bed Rolling Right: 4: Min assist;With rail Rolling Left: 1: +2 Total assist Rolling Left: Patient Percentage: 30% Right Sidelying to Sit: 4: Min guard;With rails Left Sidelying to Sit: 1: +2 Total assist Left Sidelying to Sit: Patient Percentage: 30% Supine to Sit: 4: Min assist;With rails;HOB elevated Sitting - Scoot to Edge of Bed: 4: Min assist;With rail Sitting - Scoot to Edge of Bed: Patient Percentage: 30% Sit to Sidelying Left: 1: +2 Total assist Sit to Sidelying Left: Patient Percentage: 20% Transfers Transfers: Sit to Stand;Stand to Sit Sit to Stand: 1: +2 Total assist Sit to Stand: Patient Percentage: 70% Stand to Sit: 1: +2 Total assist Stand to Sit: Patient Percentage: 80% Ambulation/Gait Ambulation/Gait Assistance: 1: +2 Total assist Ambulation/Gait: Patient Percentage: 70% Ambulation Distance (Feet): 10 Feet (5 x 2) Assistive device: 2 person hand held assist Ambulation/Gait Assistance Details: Pt initially with trunk ataxia, however decreases with gaze  stabilization on a point in front of him; decr coordination advanacing each leg, however improved with repetition; requires incr assist to weight shift Lt and Rt to allow incr step length Gait Pattern: Step-to pattern;Decreased stride length;Wide base of support Gait velocity: less than 1.8 ft/sec which indicates risk for recurrent falls Wheelchair Mobility Wheelchair Mobility: No  ADL: ADL Eating/Feeding: +1 Total assistance Where Assessed - Eating/Feeding: Bed level Grooming: +1 Total assistance Where Assessed - Grooming: Supported sitting Upper Body Bathing: +1 Total assistance Where Assessed - Upper Body Bathing: Supported sitting Lower Body Bathing: +1 Total assistance Where Assessed - Lower Body Bathing: Supported sitting Upper Body Dressing: +1 Total assistance Where Assessed - Upper Body Dressing: Supported sitting Lower Body Dressing: +1   Total assistance Where Assessed - Lower Body Dressing: Unsupported sitting Toilet Transfer: +2 Total assistance Toilet Transfer Method: Sit to stand Toilet Transfer Equipment: Regular height toilet Equipment Used: Gait belt Transfers/Ambulation Related to ADLs: incr bed mobility, sit<>stand with balance deficits. Pt with tactile cues and verbal cues to correct posture.  ADL Comments: Pt alert and aroused on arrival. Pt tracking therapist in room. pt demonstrates dysphagia . Pt exiting bed on Rt side with verbal cues for sequence and min tactile cue. Pt sitting EOB . Pt demonstrates Lt ribs closed  / Rt ribs open with Lt side hypotoxicity. Pt demonstrates inititaion of trunk extensors and able to sustain for ~ 1minutes before requiring incr tactle cue or verbal cue. Pt provided tactile input for scapular depression and verbal cue for neck extension. Pt following simple 1 step commands consistently with quick response    Cognition: Cognition Overall Cognitive Status: Impaired Arousal/Alertness: Awake/alert Orientation Level: Oriented  X4 Attention: Focused Focused Attention: Impaired Focused Attention Impairment: Verbal basic;Functional basic (due to lethargy?) Memory:  (needs continued dx; recognizes family members) Comments: needs continued differential diagnosis due to lethargy Cognition Overall Cognitive Status: Appears within functional limits for tasks assessed/performed Difficult to assess due to: Level of arousal Arousal/Alertness: Awake/alert Orientation Level: Disoriented to;Time (2004 then self corrected 2014. delayed recall of age) Behavior During Session: WFL for tasks performed Cognition - Other Comments: HOH with cueing to problem solve and self correct  Physical Exam: Blood pressure 148/70, pulse 78, temperature 97.6 F (36.4 C), temperature source Oral, resp. rate 18, height 5' 6" (1.676 m), weight 85.684 kg (188 lb 14.4 oz), SpO2 95.00%. Physical Exam  Nursing note and vitals reviewed. Constitutional: He is oriented to person, place, and time. He appears well-developed and well-nourished. He appears lethargic. He is easily aroused.  HENT:  Head: Normocephalic and atraumatic.  Left Ear: External ear normal.  Eyes: Conjunctivae and EOM are normal. Pupils are equal, round, and reactive to light.  Neck: Normal range of motion. No JVD present. No tracheal deviation present. No thyromegaly present.  Cardiovascular: Normal rate.  An irregularly irregular rhythm present. Exam reveals no gallop and no friction rub.   No murmur heard. Pulmonary/Chest: Effort normal and breath sounds normal. No respiratory distress. He has no wheezes. He has no rales. He exhibits no tenderness.  Abdominal: He exhibits distension. Bowel sounds are decreased. There is no tenderness.  Musculoskeletal: He exhibits tenderness. He exhibits no edema.  Lymphadenopathy:    He has no cervical adenopathy.  Neurological: He is oriented to person, place, and time and easily aroused. He appears lethargic.  HOH. Speech with moderate to  severe dysarthria. Left field cut with inattention.Tends to keep left eye closed--diplopia?--inconsistent tracking on EOM testing.  Impaired attention with decreased insight and poor awareness of deficits. Significant ataxia LUE and decrease in fine motor control LLE. Right arm limited at shoulder due to RTC issue. Strength grossly 4/5 except for pain inhibition.  Skin: Skin is warm and dry.  Psychiatric:  Fair insight and awareness. Very cooperative with exam.    Results for orders placed during the hospital encounter of 02/09/13 (from the past 48 hour(s))  GLUCOSE, CAPILLARY     Status: Abnormal   Collection Time    02/12/13  4:57 PM      Result Value Range   Glucose-Capillary 201 (*) 70 - 99 mg/dL   Comment 1 Notify RN     Comment 2 Documented in Chart    GLUCOSE, CAPILLARY       Status: Abnormal   Collection Time    02/12/13 10:04 PM      Result Value Range   Glucose-Capillary 178 (*) 70 - 99 mg/dL   Comment 1 Notify RN     Comment 2 Documented in Chart    BASIC METABOLIC PANEL     Status: Abnormal   Collection Time    02/13/13  3:20 AM      Result Value Range   Sodium 136  135 - 145 mEq/L   Potassium 3.8  3.5 - 5.1 mEq/L   Chloride 97  96 - 112 mEq/L   CO2 30  19 - 32 mEq/L   Glucose, Bld 124 (*) 70 - 99 mg/dL   BUN 46 (*) 6 - 23 mg/dL   Creatinine, Ser 2.32 (*) 0.50 - 1.35 mg/dL   Calcium 8.7  8.4 - 10.5 mg/dL   GFR calc non Af Amer 25 (*) >90 mL/min   GFR calc Af Amer 29 (*) >90 mL/min   Comment:            The eGFR has been calculated     using the CKD EPI equation.     This calculation has not been     validated in all clinical     situations.     eGFR's persistently     <90 mL/min signify     possible Chronic Kidney Disease.  CBC     Status: Abnormal   Collection Time    02/13/13  3:20 AM      Result Value Range   WBC 5.3  4.0 - 10.5 K/uL   RBC 3.70 (*) 4.22 - 5.81 MIL/uL   Hemoglobin 10.9 (*) 13.0 - 17.0 g/dL   HCT 31.8 (*) 39.0 - 52.0 %   MCV 85.9  78.0 -  100.0 fL   MCH 29.5  26.0 - 34.0 pg   MCHC 34.3  30.0 - 36.0 g/dL   RDW 17.8 (*) 11.5 - 15.5 %   Platelets 120 (*) 150 - 400 K/uL  GLUCOSE, CAPILLARY     Status: Abnormal   Collection Time    02/13/13  8:16 AM      Result Value Range   Glucose-Capillary 140 (*) 70 - 99 mg/dL  GLUCOSE, CAPILLARY     Status: Abnormal   Collection Time    02/13/13 11:37 AM      Result Value Range   Glucose-Capillary 140 (*) 70 - 99 mg/dL  GLUCOSE, CAPILLARY     Status: Abnormal   Collection Time    02/13/13  4:25 PM      Result Value Range   Glucose-Capillary 125 (*) 70 - 99 mg/dL  GLUCOSE, CAPILLARY     Status: Abnormal   Collection Time    02/13/13  7:59 PM      Result Value Range   Glucose-Capillary 187 (*) 70 - 99 mg/dL  GLUCOSE, CAPILLARY     Status: Abnormal   Collection Time    02/14/13  7:40 AM      Result Value Range   Glucose-Capillary 134 (*) 70 - 99 mg/dL   Comment 1 Notify RN       Post Admission Physician Evaluation: 1. Functional deficits secondary  to thrombotic left cerbellar infarct with mass effect. 2. Patient is admitted to receive collaborative, interdisciplinary care between the physiatrist, rehab nursing staff, and therapy team. 3. Patient's level of medical complexity and substantial therapy needs in context of that medical necessity cannot be provided   at a lesser intensity of care such as a SNF. 4. Patient has experienced substantial functional loss from his/her baseline which was documented above under the "Functional History" and "Functional Status" headings.  Judging by the patient's diagnosis, physical exam, and functional history, the patient has potential for functional progress which will result in measurable gains while on inpatient rehab.  These gains will be of substantial and practical use upon discharge  in facilitating mobility and self-care at the household level. 5. Physiatrist will provide 24 hour management of medical needs as well as oversight of the  therapy plan/treatment and provide guidance as appropriate regarding the interaction of the two. 6. 24 hour rehab nursing will assist with bladder management, bowel management, safety, skin/wound care, disease management, medication administration, pain management and patient education  and help integrate therapy concepts, techniques,education, etc. 7. PT will assess and treat for/with: Lower extremity strength, range of motion, stamina, balance, functional mobility, safety, adaptive techniques and equipment, NMR, vestibular and visual-spatial training.   Goals are: supervision to mod I. 8. OT will assess and treat for/with: ADL's, functional mobility, safety, upper extremity strength, adaptive techniques and equipment, NMR, vestibular and visual-spatial awareness.   Goals are: supervision to mod I. 9. SLP will assess and treat for/with: speech, communication,.  Goals are: mod I to supervision. 10. Case Management and Social Worker will assess and treat for psychological issues and discharge planning. 11. Team conference will be held weekly to assess progress toward goals and to determine barriers to discharge. 12. Patient will receive at least 3 hours of therapy per day at least 5 days per week. 13. ELOS: 7-10 days      Prognosis:  excellent   Medical Problem List and Plan: 1. DVT Prophylaxis/Anticoagulation: Pharmaceutical: Lovenox 2. Pain Management: on Depakote 500 g for headaches. Will decrease dose to 250 mg and change timing to bedtime.  3. Mood: Seems to be distracted with impaired awareness. Will have LCSW follow up for evaluation.  4. Neuropsych: This patient  Is not capable of making decisions on his/her own behalf. 5. HTN: Monitor with bid checks. Continue  6. CAD/Chronic systolic CHF/ICM with ICD: Check daily weights. Discontinue IVF. Will titrated diuretics as needed to home dose.  7. CKD Stage IV: RUE graft placed last fall. Was on lasix 80 mg tid with FR 500-1000 cc/day.  Baseline  weight at 185-190 Lbs. Baseline Cr<3.0. 8. DM type 2:  Hgb A1C @ 10. Monitor with AC/HS cbg checks. Po intake poor at this time.Continue lantus 10 units--was on 35 units at home.. Will continue SSI for elevated BS.  9. Atrial Fibrillation with history of ventricular arrhthymias: monitor HR with bid checks. Continue coreg bid.   Zachary T. Swartz, MD, FAAPMR   02/14/2013 

## 2013-02-14 NOTE — Progress Notes (Signed)
CSW received notice from [facility] that they have accepted this pt to go to [facility]. CSW Connye Burkitt is signing off this pt.  Sherald Barge, LCSW-A Clinical Social Worker [phone]

## 2013-02-14 NOTE — Telephone Encounter (Signed)
Mrs Gergen wanted the device clinic to know that Jason Walker is in the hospital and will not be sending any transmissions for the next 3-4 weeks while he is in rehab.

## 2013-02-14 NOTE — Progress Notes (Signed)
Admitting patient to inpatient rehab today. Dr Blake Divine reports pt is medically ready for d/c to [facility]. Pt is in agreement with plan. For questions, call [phone].

## 2013-02-14 NOTE — Progress Notes (Signed)
Stroke Team Progress Note  HISTORY Magnus Crescenzo is an 79 y.o. male with known A-fib but not on AC due to previous life threating GI bleed, PVT s/p ICD, CKD. Patient presented to [facility] today 02-09-13 but transferred to [facility] due to lack of neurology coverage at [facility]. Patient was last seen normal at about 0100 am 02-09-13 when he went to bed. Wife found him the next morning about 6:15 am unresponsive and S/P vomiting. She also noted areas in room where he had previously gotten up in the night and been to kitchen. She was awoken at 6 by her husband when he tried to get out of the bed and fell. HE also vomited. Per family yesterday he had multiple episodes where he became diaphoretic and nonresponsive. Initial CT at [facility] was negative. Patient was not a TPA candidate at [facility] secondary to delay in arrival. He was admitted for further evaluation and treatment.  SUBJECTIVE Wife and daughter at bedside. They feel he is doing much better today. Plans for discharge to rehab for therapy.   OBJECTIVE Most recent Vital Signs: Filed Vitals:   02/13/13 1957 02/14/13 0400 02/14/13 0807 02/14/13 0815  BP: 140/80 100/55 148/70 148/70  Pulse: 77 75 78 78  Temp: 97.6 F (36.4 C) 97.6 F (36.4 C)    TempSrc: Oral Oral    Resp:      Height:      Weight:  85.684 kg (188 lb 14.4 oz)    SpO2: 96% 97%  95%   CBG (last 3)   Recent Labs  02/13/13 1959 02/14/13 0740 02/14/13 1157  GLUCAP 187* 134* 113*   IV Fluid Intake:   . sodium chloride Stopped (02/14/13 1131)   MEDICATIONS  . aspirin EC  81 mg Oral Daily  . carvedilol  12.5 mg Oral BID WC  . clopidogrel  75 mg Oral Q breakfast  . divalproex  500 mg Oral Daily  . doxazosin  4 mg Oral QHS  . enoxaparin (LOVENOX) injection  30 mg Subcutaneous Q24H  . insulin aspart  0-9 Units Subcutaneous TID WC  . insulin aspart  3 Units Subcutaneous TID WC  . insulin glargine  20 Units Subcutaneous QHS  . pantoprazole  40 mg Oral  Daily  . ranolazine  500 mg Oral BID  . senna-docusate  1 tablet Oral BID  . simvastatin  20 mg Oral QHS   PRN:  acetaminophen, acetaminophen, nitroGLYCERIN, ondansetron (ZOFRAN) IV, senna-docusate  Diet:  Carb Control 3 nectar thick liquids Activity:   OOB with assistance DVT Prophylaxis:  Lovenox 40 mg sq daily   CLINICALLY SIGNIFICANT STUDIES Basic Metabolic Panel:   Recent Labs Lab 02/12/13 0349 02/13/13 0320  NA 136 136  K 4.0 3.8  CL 96 97  CO2 31 30  GLUCOSE 155* 124*  BUN 52* 46*  CREATININE 2.53* 2.32*  CALCIUM 8.8 8.7   Liver Function Tests:   Recent Labs Lab 02/09/13 2003  AST 22  ALT 14  ALKPHOS 66  BILITOT 0.5  PROT 8.3  ALBUMIN 3.9   CBC:   Recent Labs Lab 02/09/13 1645 02/13/13 0320  WBC 12.1* 5.3  HGB 12.7* 10.9*  HCT 37.3* 31.8*  MCV 87.1 85.9  PLT 189 120*   Coagulation: No results found for this basename: LABPROT, INR,  in the last 168 hours Cardiac Enzymes: No results found for this basename: CKTOTAL, CKMB, CKMBINDEX, TROPONINI,  in the last 168 hours Urinalysis: No results found for this basename:  COLORURINE, APPERANCEUR, LABSPEC, PHURINE, GLUCOSEU, HGBUR, BILIRUBINUR, KETONESUR, PROTEINUR, UROBILINOGEN, NITRITE, LEUKOCYTESUR,  in the last 168 hours  Lipid Panel    Component Value Date/Time   CHOL 168 02/10/2013 0500   TRIG 150* 02/10/2013 0500   HDL 35* 02/10/2013 0500   CHOLHDL 4.8 02/10/2013 0500   VLDL 30 02/10/2013 0500   LDLCALC 103* 02/10/2013 0500   HgbA1C  Lab Results  Component Value Date   HGBA1C 10.0* 02/10/2013   Urine Drug Screen:   No results found for this basename: labopia,  cocainscrnur,  labbenz,  amphetmu,  thcu,  labbarb    Alcohol Level: No results found for this basename: ETH,  in the last 168 hours  US Abdomen Complete  02/10/2013   1.  7 mm gallstone which is impacted in the gallbladder neck.  No sonographic evidence of acute cholecystitis. 2.  No biliary ductal dilation. 3.  Normal-appearing pancreatic  head and proximal body.  The distal body and tail were obscured by overlying bowel gas. 3.  Atrophic left kidney.  3 cm simple cyst in the upper pole of the otherwise normal-appearing right kidney.     CT of the brain  02/13/2013  Infarcts in the left cerebellum and left pons with effacement of the fourth ventricle. No significant interval change. No acute intracranial hemorrhage.  CT of the brain  02/11/2013  Stable appearing left cerebellar infarct causing extensive cytotoxic edema.  There is localized mass effect on the fourth ventricle, stable.  There is no new infarct compared to recent prior study.  No acute hemorrhage.  Underlying atrophy.    CT Angio Head 02/10/2013  Moderate to large left cerebellar infarct with local mass effect. Presently no associated hemorrhage or hydrocephalus currently.  Although there is narrowing of portions of the vertebral arteries and basilar artery, no high-grade stenosis or thrombus identified.  Portions of the PICAs are noted bilaterally.  Portions of the AICA visualized bilaterally. Left AICA only noted proximally with poor delineation of the distal branches.  Mild to moderate narrowing of the cavernous segment of the internal carotid artery bilaterally.    MRI/A of the brain  pacer  2D Echocardiogram  The estimated ejection fraction was 35%. Diffuse hypokinesis. There was a reduced contribution of atrial contraction to ventricular filling, due to increased ventricular diastolic pressure or atrial contractile dysfunction.  Carotid Doppler  No significant extracranial carotid artery stenosis demonstrated. Atypical vertebral flow bilaterally suggesting proximal obstruction.  CXR  02/10/2013   Shallow inspiration with mild congestive changes.  No edema or consolidation.     EKG  atrial fibrillation, paced rhythm  Therapy Recommendations CIR  Physical Exam   Elderly Caucasian male not in distress. Awake alert. Afebrile. Head is nontraumatic. Neck is supple  without bruit. Hearing is normal. Cardiac exam no murmur or gallop. Lungs are clear to auscultation. Distal pulses are well felt. Neurological Exam : Awake alert, mild dysarthria. No aphasia or apraxia. Pupils equal reactive. Fundi were not visualized. Vision acuity and fields appear normal. Extraocular moments are full range but there is slightly decreased but reactive the left with few beats of sustained nystagmus on left lateral gaze. Face is symmetric tongue is midline. Cough and gag week. This no upper or lower expected drift but there is mild left finger-to-nose dysmetria with slight weakness of the left intrinsic hand muscles. Orbits right over left approximately. Lower extremity exam shows symmetric strength and coordination. Sensation is intact. Plantars are downgoing. Gait was not tested.  ASSESSMENT Mr.  Jason Walker is a 79 y.o. male found unresponsive, he had been vomiting. Imaging confirms a large left cerebellar embolic infarct with cytotoxic cerebral edema with mild hydrocephalus, suspect left brainstem infarct not well seen on CT. Infarcts felt to be embolic secondary to known atrial fibrillation.  Not a coumadin candidate secondary to hx previous GI bleeding in Oct 2013 requiring 7 unit transfusions, at high risk for rebleeding per gastroenterologist (Dr Samuella Cota in [place], [place]). Therefore, on aspirin 81 mg orally every day and clopidogrel 75 mg orally every day prior to admission.  Now on aspirin 81 mg orally every day and clopidogrel 75 mg orally every day for secondary stroke prevention. Patient with resultant lethargy, nausea, vomiting, headache, dysarthria, visual abnormalities, dizziness, dysphagia, eye movement abnormalities. Overall, continues to improve. CIR planning admission today.  Hypertension Hyperlipidemia, LDL 103, on statin PTA, on statin now, goal LDL < 100 Diabetes, HgbA1c 10.0  atrial fibrillation Headache. Placed on depakote. Headache improving. Pacer/defibrillator CAD  - MI Hx GIB Oct 2013, at high risk for rebleed per GI CKD, stage IV. Improving Cr up, down to 2.32. Goal Cr 2-3 per nephrologist per wife.  Hospital day # 5  TREATMENT/PLAN  Agree with plans for rehab for admission today  Continue aspirin 81 mg orally every day and clopidogrel 75 mg orally every day for secondary stroke prevention. Not an anticoagulation candidate secondary to hx GIB. No further stroke workup indicated. Patient has a 10-15% risk of having another stroke over the next year, the highest risk is within 2 weeks of the most recent stroke/TIA (risk of having a stroke following a stroke or TIA is the same). Ongoing risk factor control by Primary Care Physician Stroke Service will sign off. Please call should any needs arise. Follow up with Dr. Pearlean Brownie, Stroke Clinic, in 2 months.  Dr. Marjory Lies discussed with wife, daughter and patient.  Annie Main, MSN, RN, ANVP-BC, ANP-BC, [facility] Pager: [phone] 02/14/2013 1:46 PM  I have personally obtained a history, examined the patient, evaluated imaging results, and formulated the assessment and plan of care. I agree with the above.   Suanne Marker, MD 02/14/2013, 8:44 PM Certified in Neurology, Neurophysiology and Neuroimaging [facility] - Stroke Team  Please refer to [email or web address] for on-call Stroke MD

## 2013-02-14 NOTE — Progress Notes (Signed)
Pt arrived on 4000 at approx 1700, transported from sending unit via bed.

## 2013-02-14 NOTE — H&P (Signed)
Physical Medicine and Rehabilitation Admission H&P    CC: Dizziness, balance deficits, gaze instability.  HPI: Jason Walker is a 79 y.o. male with history of DM, CAD, A-Fib but no anticoagulation due to life threating GIB, ICD; who was admitted to [facility] on 02/09/13 with N/V, fall and unresponsiveness. Initial CCT negative. [facility]. 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 Abdominal U/S done due to elevated amylase and CKD. This revealed 7 mm gallstone, no cholecystitis and atrophic left kidney. Carotid dopplers without ICA stenosis. 2D echo with EF 35% with diffuse hypokinesis and mild-moderate MVR. Neurology recommended continuing ASA/Plavix and close monitoring as at risk for hydrocephalus and increased ICP. Follow up CCT 03/18 with stable L-cerebellar infarct with extensive cytotoxic edema and mass effect. Placed on Depakote for headaches. Patient continues Dizziness with balance and gaze disturbances. Mentation improving   Review of Systems  Eyes: Positive for blurred vision (visual deficits since stroke. ).  Respiratory: Positive for shortness of breath (with exertion. ).   Cardiovascular:       History of angina-had an episode earlier this month.  Gastrointestinal: Positive for constipation. Negative for heartburn and nausea.  Genitourinary: Positive for urgency and frequency (gets up 4-5 times at night due to diuretics. ).  Musculoskeletal: Negative for myalgias.  Neurological: Positive for dizziness, speech change, focal weakness and headaches.   Past Medical History  Diagnosis Date  . RBBB (right bundle branch block)   . AAA (abdominal aortic aneurysm)   . AMI (acute myocardial infarction)   . Diabetes mellitus   . Coronary artery disease   . Pacemaker   . Defibrillator activation   . Hypertension   . Heart disease   . Heart attack   . Wears dentures   .  Hyperlipidemia   . Shortness of breath   . Kidney disease   . Hyperpotassemia   . Edema     BLE  . Systolic CHF    Past Surgical History  Procedure Laterality Date  . Coronary artery bypass graft  1990  . Icd  2011  . Hernia repair      umbilical and RIH  . Aneurysm coiling    . Rotator cuff repair      bilateral  . Lasik      with lens implant.    Family History  Problem Relation Age of Onset  . Heart disease Mother   . Diabetes Mother   . Cancer Brother     colon  . Heart disease Brother     heart attack  . Stroke Other   . Hypertension Other    Social History: Married. Retired from [employer]. Independent PTA. Per reports  reports that he quit smoking about 25 years ago. His smoking use included Cigarettes. He smoked 0.00 packs per day for 30 years. He has never used smokeless tobacco. He reports that  drinks alcohol. He reports that he does not use illicit drugs.   Allergies  Allergen Reactions  . Lorazepam   . Morphine And Related    Medications Prior to Admission  Medication Sig Dispense Refill  . aspirin 81 MG tablet Take 81 mg by mouth daily.      . carvedilol (COREG) 12.5 MG tablet Take 1 tablet (12.5 mg total) by mouth 2 (two) times daily.  180 tablet  3  . cholecalciferol (VITAMIN D) 1000 UNITS tablet Take  1,000 Units by mouth daily.      . clopidogrel (PLAVIX) 75 MG tablet Take 75 mg by mouth daily.      Marland Kitchen doxazosin (CARDURA) 4 MG tablet Take 4 mg by mouth at bedtime.        . furosemide (LASIX) 40 MG tablet Take 1 tablet (40 mg total) by mouth 2 (two) times daily.  30 tablet    . insulin glargine (LANTUS) 100 UNIT/ML injection Inject 35 Units into the skin at bedtime.        . nitroGLYCERIN (NITROSTAT) 0.4 MG SL tablet Place 0.4 mg under the tongue every 5 (five) minutes as needed.        Marland Kitchen omeprazole (PRILOSEC) 20 MG capsule Take 20 mg by mouth daily.       . ranolazine (RANEXA) 500 MG 12 hr tablet Take 500 mg by mouth 2 (two) times daily.        .  simvastatin (ZOCOR) 40 MG tablet Take 20 mg by mouth at bedtime.       . [DISCONTINUED] lisinopril (PRINIVIL,ZESTRIL) 40 MG tablet Take 40 mg by mouth daily.        . [DISCONTINUED] metFORMIN (GLUCOPHAGE) 1000 MG tablet Take 1,000 mg by mouth 2 (two) times daily.          Home: Home Living Lives With: Spouse Available Help at Discharge: Family;Available PRN/intermittently Type of Home: House Home Access: Level entry Home Layout: One level Bathroom Shower/Tub: Walk-in shower;Tub/shower unit;Curtain Bathroom Toilet: Handicapped height Bathroom Accessibility: Yes How Accessible: Accessible via walker Home Adaptive Equipment: Built-in shower seat   Functional History: Prior Function Able to Take Stairs?: Yes Driving: Yes (occ. drove per wife, but, did not drive at night) Vocation: Retired  Functional Status:  Mobility: Bed Mobility Bed Mobility: Rolling Right;Right Sidelying to Sit;Sitting - Scoot to Edge of Bed Rolling Right: 4: Min assist;With rail Rolling Left: 1: +2 Total assist Rolling Left: Patient Percentage: 30% Right Sidelying to Sit: 4: Min guard;With rails Left Sidelying to Sit: 1: +2 Total assist Left Sidelying to Sit: Patient Percentage: 30% Supine to Sit: 4: Min assist;With rails;HOB elevated Sitting - Scoot to Edge of Bed: 4: Min assist;With rail Sitting - Scoot to [place] of Bed: Patient Percentage: 30% Sit to Sidelying Left: 1: +2 Total assist Sit to Sidelying Left: Patient Percentage: 20% Transfers Transfers: Sit to Stand;Stand to Sit Sit to Stand: 1: +2 Total assist Sit to Stand: Patient Percentage: 70% Stand to Sit: 1: +2 Total assist Stand to Sit: Patient Percentage: 80% Ambulation/Gait Ambulation/Gait Assistance: 1: +2 Total assist Ambulation/Gait: Patient Percentage: 70% Ambulation Distance (Feet): 10 Feet (5 x 2) Assistive device: 2 person hand held assist Ambulation/Gait Assistance Details: Pt initially with trunk ataxia, however decreases with gaze  stabilization on a point in front of him; decr coordination advanacing each leg, however improved with repetition; requires incr assist to weight shift Lt and Rt to allow incr step length Gait Pattern: Step-to pattern;Decreased stride length;Wide base of support Gait velocity: less than 1.8 ft/sec which indicates risk for recurrent falls Wheelchair Mobility Wheelchair Mobility: No  ADL: ADL Eating/Feeding: +1 Total assistance Where Assessed - Eating/Feeding: Bed level Grooming: +1 Total assistance Where Assessed - Grooming: Supported sitting Upper Body Bathing: +1 Total assistance Where Assessed - Upper Body Bathing: Supported sitting Lower Body Bathing: +1 Total assistance Where Assessed - Lower Body Bathing: Supported sitting Upper Body Dressing: +1 Total assistance Where Assessed - Upper Body Dressing: Supported sitting Lower Body Dressing: +1  Total assistance Where Assessed - Lower Body Dressing: Unsupported sitting Toilet Transfer: +2 Total assistance Toilet Transfer Method: Sit to stand Toilet Transfer Equipment: Regular height toilet Equipment Used: Gait belt Transfers/Ambulation Related to ADLs: incr bed mobility, sit<>stand with balance deficits. Pt with tactile cues and verbal cues to correct posture.  ADL Comments: Pt alert and aroused on arrival. Pt tracking therapist in room. pt demonstrates dysphagia . Pt exiting bed on Rt side with verbal cues for sequence and min tactile cue. Pt sitting EOB . Pt demonstrates Lt ribs closed  / Rt ribs open with Lt side hypotoxicity. Pt demonstrates inititaion of trunk extensors and able to sustain for ~ before requiring incr tactle cue or verbal cue. Pt provided tactile input for scapular depression and verbal cue for neck extension. Pt following simple 1 step commands consistently with quick response    Cognition: Cognition Overall Cognitive Status: Impaired Arousal/Alertness: Awake/alert Orientation Level: Oriented  X4 Attention: Focused Focused Attention: Impaired Focused Attention Impairment: Verbal basic;Functional basic (due to lethargy?) Memory:  (needs continued dx; recognizes family members) Comments: needs continued differential diagnosis due to lethargy Cognition Overall Cognitive Status: Appears within functional limits for tasks assessed/performed Difficult to assess due to: Level of arousal Arousal/Alertness: Awake/alert Orientation Level: Disoriented to;Time (2004 then self corrected 2014. delayed recall of age) Behavior During Session: WFL for tasks performed Cognition - Other Comments: HOH with cueing to problem solve and self correct  Physical Exam: Blood pressure 148/70, pulse 78, temperature 97.6 F (36.4 C), temperature source Oral, resp. rate 18, height 5\' 6"  (1.676 m), weight 85.684 kg (188 lb 14.4 oz), SpO2 95.00%. Physical Exam  Nursing note and vitals reviewed. Constitutional: He is oriented to person, place, and time. He appears well-developed and well-nourished. He appears lethargic. He is easily aroused.  HENT:  Head: Normocephalic and atraumatic.  Left Ear: External ear normal.  Eyes: Conjunctivae and EOM are normal. Pupils are equal, round, and reactive to light.  Neck: Normal range of motion. No JVD present. No tracheal deviation present. No thyromegaly present.  Cardiovascular: Normal rate.  An irregularly irregular rhythm present. Exam reveals no gallop and no friction rub.   No murmur heard. Pulmonary/Chest: Effort normal and breath sounds normal. No respiratory distress. He has no wheezes. He has no rales. He exhibits no tenderness.  Abdominal: He exhibits distension. Bowel sounds are decreased. There is no tenderness.  Musculoskeletal: He exhibits tenderness. He exhibits no edema.  Lymphadenopathy:    He has no cervical adenopathy.  Neurological: He is oriented to person, place, and time and easily aroused. He appears lethargic.  HOH. Speech with moderate to  severe dysarthria. Left field cut with inattention.Tends to keep left eye closed--diplopia?--inconsistent tracking on EOM testing.  Impaired attention with decreased insight and poor awareness of deficits. Significant ataxia LUE and decrease in fine motor control LLE. Right arm limited at shoulder due to RTC issue. Strength grossly 4/5 except for pain inhibition.  Skin: Skin is warm and dry.  Psychiatric:  Fair insight and awareness. Very cooperative with exam.    Results for orders placed during the hospital encounter of 02/09/13 (from the past 48 hour(s))  GLUCOSE, CAPILLARY     Status: Abnormal   Collection Time    02/12/13  4:57 PM      Result Value Range   Glucose-Capillary 201 (*) 70 - 99 mg/dL   Comment 1 Notify RN     Comment 2 Documented in Chart    GLUCOSE, CAPILLARY  Status: Abnormal   Collection Time    02/12/13 10:04 PM      Result Value Range   Glucose-Capillary 178 (*) 70 - 99 mg/dL   Comment 1 Notify RN     Comment 2 Documented in Chart    BASIC METABOLIC PANEL     Status: Abnormal   Collection Time    02/13/13  3:20 AM      Result Value Range   Sodium 136  135 - 145 mEq/L   Potassium 3.8  3.5 - 5.1 mEq/L   Chloride 97  96 - 112 mEq/L   CO2 30  19 - 32 mEq/L   Glucose, Bld 124 (*) 70 - 99 mg/dL   BUN 46 (*) 6 - 23 mg/dL   Creatinine, Ser 0.98 (*) 0.50 - 1.35 mg/dL   Calcium 8.7  8.4 - 11.9 mg/dL   GFR calc non Af Amer 25 (*) >90 mL/min   GFR calc Af Amer 29 (*) >90 mL/min   Comment:            The eGFR has been calculated     using the CKD EPI equation.     This calculation has not been     validated in all clinical     situations.     eGFR's persistently     <90 mL/min signify     possible Chronic Kidney Disease.  CBC     Status: Abnormal   Collection Time    02/13/13  3:20 AM      Result Value Range   WBC 5.3  4.0 - 10.5 K/uL   RBC 3.70 (*) 4.22 - 5.81 MIL/uL   Hemoglobin 10.9 (*) 13.0 - 17.0 g/dL   HCT 14.7 (*) 82.9 - 56.2 %   MCV 85.9  78.0 -  100.0 fL   MCH 29.5  26.0 - 34.0 pg   MCHC 34.3  30.0 - 36.0 g/dL   RDW 13.0 (*) 86.5 - 78.4 %   Platelets 120 (*) 150 - 400 K/uL  GLUCOSE, CAPILLARY     Status: Abnormal   Collection Time    02/13/13  8:16 AM      Result Value Range   Glucose-Capillary 140 (*) 70 - 99 mg/dL  GLUCOSE, CAPILLARY     Status: Abnormal   Collection Time    02/13/13 11:37 AM      Result Value Range   Glucose-Capillary 140 (*) 70 - 99 mg/dL  GLUCOSE, CAPILLARY     Status: Abnormal   Collection Time    02/13/13  4:25 PM      Result Value Range   Glucose-Capillary 125 (*) 70 - 99 mg/dL  GLUCOSE, CAPILLARY     Status: Abnormal   Collection Time    02/13/13  7:59 PM      Result Value Range   Glucose-Capillary 187 (*) 70 - 99 mg/dL  GLUCOSE, CAPILLARY     Status: Abnormal   Collection Time    02/14/13  7:40 AM      Result Value Range   Glucose-Capillary 134 (*) 70 - 99 mg/dL   Comment 1 Notify RN       Post Admission Physician Evaluation: 1. Functional deficits secondary  to thrombotic left cerbellar infarct with mass effect. 2. Patient is admitted to receive collaborative, interdisciplinary care between the physiatrist, rehab nursing staff, and therapy team. 3. Patient's level of medical complexity and substantial therapy needs in context of that medical necessity cannot be provided  at a lesser intensity of care such as a SNF. 4. Patient has experienced substantial functional loss from his/her baseline which was documented above under the "Functional History" and "Functional Status" headings.  Judging by the patient's diagnosis, physical exam, and functional history, the patient has potential for functional progress which will result in measurable gains while on inpatient rehab.  These gains will be of substantial and practical use upon discharge  in facilitating mobility and self-care at the household level. 5. Physiatrist will provide 24 hour management of medical needs as well as oversight of the  therapy plan/treatment and provide guidance as appropriate regarding the interaction of the two. 6. 24 hour rehab nursing will assist with bladder management, bowel management, safety, skin/wound care, disease management, medication administration, pain management and patient education  and help integrate therapy concepts, techniques,education, etc. 7. PT will assess and treat for/with: Lower extremity strength, range of motion, stamina, balance, functional mobility, safety, adaptive techniques and equipment, NMR, vestibular and visual-spatial training.   Goals are: supervision to mod I. 8. OT will assess and treat for/with: ADL's, functional mobility, safety, upper extremity strength, adaptive techniques and equipment, NMR, vestibular and visual-spatial awareness.   Goals are: supervision to mod I. 9. SLP will assess and treat for/with: speech, communication,.  Goals are: mod I to supervision. 10. Case Management and Social Worker will assess and treat for psychological issues and discharge planning. 11. Team conference will be held weekly to assess progress toward goals and to determine barriers to discharge. 12. Patient will receive at least 3 hours of therapy per day at least 5 days per week. 13. ELOS: 7-10 days      Prognosis:  excellent   Medical Problem List and Plan: 1. DVT Prophylaxis/Anticoagulation: Pharmaceutical: Lovenox 2. Pain Management: on Depakote 500 g for headaches. Will decrease dose to 250 mg and change timing to bedtime.  3. Mood: Seems to be distracted with impaired awareness. Will have LCSW follow up for evaluation.  4. Neuropsych: This patient  Is not capable of making decisions on his/her own behalf. 5. HTN: Monitor with bid checks. Continue  6. CAD/Chronic systolic CHF/ICM with ICD: Check daily weights. Discontinue IVF. Will titrated diuretics as needed to home dose.  7. CKD Stage IV: RUE graft placed last fall. Was on lasix 80 mg tid with FR [phone] cc/day.  Baseline  weight at 185-190 Lbs. Baseline Cr<3.0. 8. DM type 2:  Hgb A1C @ 10. Monitor with AC/HS cbg checks. Po intake poor at this time.Continue lantus 10 units--was on 35 units at home.. Will continue SSI for elevated BS.  9. Atrial Fibrillation with history of ventricular arrhthymias: monitor HR with bid checks. Continue coreg bid.   Ranelle Oyster, MD, [place] [facility]   02/14/2013

## 2013-02-14 NOTE — Care Management Note (Signed)
    Page 1 of 1   02/14/2013     4:18:57 PM   CARE MANAGEMENT NOTE 02/14/2013  Patient:  Jason Walker, Jason Walker   Account Number:  [id number]  Date Initiated:  02/10/2013  Documentation initiated by:  Jacquelynn Cree  Subjective/Objective Assessment:   admitted with rt sided weakness, CVA workup     Action/Plan:   PT/OT evals-recommending SNF   Anticipated DC Date:  02/14/2013   Anticipated DC Plan:  IP REHAB FACILITY  In-house referral  Clinical Social Worker      DC Planning Services  CM consult      Choice offered to / List presented to:             Status of service:  Completed, signed off Medicare Important Message given?   (If response is "NO", the following Medicare IM given date fields will be blank) Date Medicare IM given:   Date Additional Medicare IM given:    Discharge Disposition:  IP REHAB FACILITY  Per UR Regulation:  Reviewed for med. necessity/level of care/duration of stay  If discussed at Long Length of Stay Meetings, dates discussed:    Comments:  02/14/13- 1600- Donn Pierini RN, BSN [phone] Pt discharged to [facility] today.  02-13-13 [address], [place] [phone] CM will continue to monitor for disposition needs. Plan for CIR vs SNF.

## 2013-02-14 NOTE — Progress Notes (Signed)
Pt has requested that all four side rails be kept in the up position.

## 2013-02-14 NOTE — Discharge Summary (Signed)
Physician Discharge Summary  Jason Walker ZOX:[id number] DOB: 1930/07/27 DOA: 02/09/2013  PCP: Zachery Dauer, MD  Admit date: 02/09/2013 Discharge date: 02/14/2013  Time spent: 42 minutes  Recommendations for Outpatient Follow-up:  1.  recommend checking a BMP on Monday and if creatinine comes back to baseline, he can be restarted on lisinopril.   Discharge Diagnoses:  Principal Problem:   CVA (cerebral infarction) Active Problems:   CAD   VENTRICULAR TACHYCARDIA, PAROXYSMAL   Chronic systolic heart failure   AUTOMATIC IMPLANTABLE CARDIAC DEFIBRILLATOR SITU   Atrial fibrillation   DM type 1 (diabetes mellitus, type 1)   CKD (chronic kidney disease) stage 4, GFR 15-29 ml/min   Discharge Condition: fair.  Diet recommendation: carb modified diet  Filed Weights   02/11/13 0100 02/12/13 1500 02/14/13 0400  Weight: 87.4 kg (192 lb 10.9 oz) 87.4 kg (192 lb 10.9 oz) 85.684 kg (188 lb 14.4 oz)    History of present illness:  79 y/o man with PMH significant for a fib not on anticoagulation due to a life-threatening GI Bleed, paroxysmal v tach s/p ICD, CKD Stage IV, CAD and IDDM. He presented to [facility] and was transferred to [facility] due to lack of Neurology coverage at [facility]. Patient was last seen normal at about 10 pm the night before admit when he went to bed. Wife found him on the floor, unresponsive at 6:15 the following AM covered in vomit. At his arrival to [facility], he was noted to have a prominent left facial droop and was completely unresponsive. CT scan was apparently negative. NIH score reported as 32. TPA was not given due to delay in arrival. He was transferred to ICU as his CT head showed some edema with headache and vision abnormalities. He was later transferred to telemetry on 3/20 and is being discharged to CIR.    Hospital Course:   Large L Acute Cerebellar embolic CVA w/ cytotoxic edema/hydrocephalus  -felt to be due to afib  -not a coumadin candidate  due to hx of severe GI bleeding (Oct 2013) . Resume aspirin and plavix for secondary stroke prevention.  -As patient is symptomatic with severe HA, and increased vision disturbances, Dr. Pearlean Brownie has recommended transfer to ICU for closer monitoring. His headaches improved and he was transferred to telemetry floor on 3/20. He was put on depakote for headache . PT evaluation recommended CIR. He will be discharged to CIR today.   - speech and swallow eval on 3/19 recommended regular diet with thin liquids.   HTN  BP is currently well controlled  HLD  Continue Zocor as per home dosing  IDDM  -CBGs are reasonably controlled at this time . CBG (last 3)   Recent Labs  02/13/13 1625 02/13/13 1959 02/14/13 0740  GLUCAP 125* 187* 134*   hgba1c is 10 Resume home dose of lantus with SSI. Stop the premeal coverage.   CKD Stage IV - atrophic left kidney noted on ultrasound  -baseline crt 2.0 in 2011  -received contrast for a CTA  -follow crt and avoid further potential nephrotoxic agents till his creatinine improves. His creatinine is 2.32. Recommend repeating BMP on Monday.   History of CAD/Chronic Systolic CHF  -Clinically stable at the present time . He is compensated. Resume lasix on discharge and hold lisinopril till creatinine is back at baseline, at which time he can be restarted on the lisinopril.   A FIb  -Not a coumadin candidate due to history of significant GI bleeding as noted above -  continue aspirin and Plavix   AAA  Asymptomatic   Cholelithiasis without evidence of cholecystitis  Ultrasound results noted - patient is entirely asymptomatic - follow clinically  Constipation: started the patient on stool softners and miralax.   Procedures:  US Abdomen Complete 02/10/2013 1. 7 mm gallstone which is impacted in the gallbladder neck. No sonographic evidence of acute cholecystitis. 2. No biliary ductal dilation. 3. Normal-appearing pancreatic head and proximal body. The distal body  and tail were obscured by overlying bowel gas. 3. Atrophic left kidney. 3 cm simple cyst in the upper pole of the otherwise normal-appearing right kidney.  CT of the brain 02/11/2013 Stable appearing left cerebellar infarct causing extensive cytotoxic edema. There is localized mass effect on the fourth ventricle, stable. There is no new infarct compared to recent prior study. No acute hemorrhage. Underlying atrophy.  CT Angio Head 02/10/2013 Moderate to large left cerebellar infarct with local mass effect. Presently no associated hemorrhage or hydrocephalus currently. Although there is narrowing of portions of the vertebral arteries and basilar artery, no high-grade stenosis or thrombus identified. Portions of the PICAs are noted bilaterally. Portions of the AICA visualized bilaterally. Left AICA only noted proximally with poor delineation of the distal branches. Mild to moderate narrowing of the cavernous segment of the internal carotid artery bilaterally.  2D Echocardiogram The estimated ejection fraction was 35%. Diffuse hypokinesis. There was a reduced contribution of atrial contraction to ventricular filling, due to increased ventricular diastolic pressure or atrial contractile dysfunction.  Carotid Doppler No significant extracranial carotid artery stenosis demonstrated. Atypical vertebral flow bilaterally suggesting proximal obstruction  Consultations:  Neurology.   Discharge Exam: Filed Vitals:   02/13/13 1957 02/14/13 0400 02/14/13 0807 02/14/13 0815  BP: 140/80 100/55 148/70 148/70  Pulse: 77 75 78 78  Temp: 97.6 F (36.4 C) 97.6 F (36.4 C)    TempSrc: Oral Oral    Resp:      Height:      Weight:  85.684 kg (188 lb 14.4 oz)    SpO2: 96% 97%  95%   General: No acute respiratory distress  Lungs: Clear to auscultation bilaterally without wheezes or crackles  Cardiovascular: Regular rate and rhythm without murmur gallop or rub  Abdomen: Nontender, nondistended, soft, bowel sounds  positive, no rebound, no ascites, no appreciable mass  Extremities: No significant cyanosis, clubbing, or edema bilateral lower extremities  Neuro: Lower extremity exam shows symmetric strength and coordination. Symmetric face, tongue midline. No aphasia. Pupils equal and reactive.     Discharge Instructions  Discharge Orders   Future Appointments Provider Department Dept Phone   03/17/2013 10:00 AM Marinus Maw, MD [facility] [place] [phone]   05/12/2013 9:05 AM [facility] Main Office [place]) [phone]   Future Orders Complete By Expires     (HEART FAILURE PATIENTS) Call MD:  Anytime you have any of the following symptoms: 1) 3 pound weight gain in 24 hours or 5 pounds in 1 week 2) shortness of breath, with or without a dry hacking cough 3) swelling in the hands, feet or stomach 4) if you have to sleep on extra pillows at night in order to breathe.  As directed     Activity as tolerated - No restrictions  As directed     Call MD for:  persistant dizziness or light-headedness  As directed     Call MD for:  persistant nausea and vomiting  As directed     Diet Carb Modified  As directed     Discharge instructions  As directed     Comments:      Follow up with neurology in 2 months with [facility].  Follow up with PCP in one to two weeks.        Medication List    STOP taking these medications       metFORMIN 1000 MG tablet  Commonly known as:  GLUCOPHAGE      TAKE these medications       aspirin 81 MG tablet  Take 81 mg by mouth daily.     carvedilol 12.5 MG tablet  Commonly known as:  COREG  Take 1 tablet (12.5 mg total) by mouth 2 (two) times daily.     cholecalciferol 1000 UNITS tablet  Commonly known as:  VITAMIN D  Take 1,000 Units by mouth daily.     clopidogrel 75 MG tablet  Commonly known as:  PLAVIX  Take 75 mg by mouth daily.     divalproex 500 MG 24 hr tablet  Commonly known as:  DEPAKOTE ER   Take 1 tablet (500 mg total) by mouth daily.     doxazosin 4 MG tablet  Commonly known as:  CARDURA  Take 4 mg by mouth at bedtime.     furosemide 40 MG tablet  Commonly known as:  LASIX  Take 1 tablet (40 mg total) by mouth 2 (two) times daily.     insulin aspart 100 UNIT/ML injection  Commonly known as:  novoLOG  Inject 0-9 Units into the skin 3 (three) times daily with meals.     insulin glargine 100 UNIT/ML injection  Commonly known as:  LANTUS  Inject 35 Units into the skin at bedtime.     lisinopril 40 MG tablet  Commonly known as:  PRINIVIL,ZESTRIL  Take 0.5 tablets (20 mg total) by mouth daily.     nitroGLYCERIN 0.4 MG SL tablet  Commonly known as:  NITROSTAT  Place 0.4 mg under the tongue every 5 (five) minutes as needed.     omeprazole 20 MG capsule  Commonly known as:  PRILOSEC  Take 20 mg by mouth daily.     polyethylene glycol packet  Commonly known as:  MIRALAX  Take 17 g by mouth daily as needed.     ranolazine 500 MG 12 hr tablet  Commonly known as:  RANEXA  Take 500 mg by mouth 2 (two) times daily.     senna-docusate 8.6-50 MG per tablet  Commonly known as:  Senokot-S  Take 1 tablet by mouth 2 (two) times daily.     simvastatin 40 MG tablet  Commonly known as:  ZOCOR  Take 20 mg by mouth at bedtime.          The results of significant diagnostics from this hospitalization (including imaging, microbiology, ancillary and laboratory) are listed below for reference.    Significant Diagnostic Studies: Ct Angio Head W/cm &/or Wo Cm  02/10/2013  **ADDENDUM** CREATED: 02/10/2013 18:35:38  Critical Value/emergent results were called by telephone at the time of interpretation on 02/10/2013 at 6:35 p.m. to Dr. Thad Ranger ., who verbally acknowledged these results. **END ADDENDUM**   02/10/2013  *RADIOLOGY REPORT*  Clinical Data:  Vomiting.  Unresponsive.  Evaluate posterior circulation.  CT ANGIOGRAPHY HEAD  Technique:  Multidetector CT imaging of the head  was performed using the standard protocol during bolus administration of intravenous contrast.  Multiplanar CT image reconstructions including MIPs were obtained to evaluate the vascular anatomy.  Contrast: 50mL OMNIPAQUE IOHEXOL 350 MG/ML SOLN  Comparison:   None.  Findings:  Moderate to large left posterior fossa infarct involving a majority of the left cerebellum sparing the superior aspect. Presently no associated hemorrhage however, there is associated mass effect and the patient may be at risk for development of hydrocephalus if there is further progression of edema.  Remote infarcts frontal lobes, basal ganglia and left thalamus. Small vessel disease type changes.  Global atrophy.  Ventricular prominence probably related to atrophy but will need to be monitored.  No intracranial hemorrhage.  No intracranial enhancing lesion.  The left vertebral artery is the dominant vertebral artery.  Mild narrowing of the left vertebral artery with ectasia.  Right vertebral artery is small after the takeoff of the right PICA.  Basilar artery is ectatic, slightly small and irregular although not occluded or without focal high-grade stenosis.  Portions of the PICAs are noted bilaterally.  Portions of the AICA visualized bilaterally. Left AICA only noted proximally with poor delineation of the distal branches.  Atherosclerotic type changes with mild to moderate narrowing cavernous segment of the internal carotid artery bilaterally. Fetal type origin posterior cerebral artery bilaterally.  No high-grade stenosis of the M1 segment or A1 segment of the middle cerebral artery or anterior cerebral artery on either side.  Middle cerebral artery branch vessel irregularity bilaterally.  No aneurysm or vascular malformation noted.   Review of the MIP images confirms the above findings.  IMPRESSION: Moderate to large left cerebellar infarct with local mass effect. Presently no associated hemorrhage or hydrocephalus currently.  Although  there is narrowing of portions of the vertebral arteries and basilar artery, no high-grade stenosis or thrombus identified.  Portions of the PICAs are noted bilaterally.  Portions of the AICA visualized bilaterally. Left AICA only noted proximally with poor delineation of the distal branches.  Mild to moderate narrowing of the cavernous segment of the internal carotid artery bilaterally.   Original Report Authenticated By: Lacy Duverney, M.D.    Dg Chest 2 View  02/10/2013  *RADIOLOGY REPORT*  Clinical Data: Cough and vomiting.  CHEST - 2 VIEW  Comparison: 04/06/2010  Findings: Stable appearance of mediastinal postoperative changes and cardiac pacemakers since previous study.  Shallow inspiration. Mild cardiac enlargement with mild pulmonary vascular congestion. No edema.  No focal consolidation or airspace disease.  No blunting of costophrenic angles.  Calcified granulomas on the right.  No pneumothorax.  Calcified and tortuous aorta.  Postoperative changes in the cervical spine and right shoulder.  IMPRESSION: Shallow inspiration with mild congestive changes.  No edema or consolidation.   Original Report Authenticated By: Burman Nieves, M.D.    Ct Head Wo Contrast  02/13/2013  *RADIOLOGY REPORT*  Clinical Data: Follow-up stroke.  CT HEAD WITHOUT CONTRAST  Technique:  Contiguous axial images were obtained from the base of the skull through the vertex without contrast.  Comparison: 02/11/2013  Findings: Moderately large low attenuation area consistent with acute infarct involving the left cerebellum with associated effacement of the fourth ventricle and posterior aspect of the mid brain.  Low attenuation change in the mid brain consistent with ischemia. No significant change since the previous study.  No new infarct or mass effect.  No acute intracranial hemorrhage.  Chronic atrophy and ventricular dilatation.  Small vessel ischemic changes in the deep white matter.  No abnormal extra-axial fluid collections.   No depressed skull fractures.  Visualized paranasal sinuses and mastoid air cells are not significantly opacified. Small amount of  mucus in the left maxillary antrum.  Vascular calcifications.  IMPRESSION: Infarcts in the left cerebellum and left pons with effacement of the fourth ventricle.  No significant interval change.  No acute intracranial hemorrhage.   Original Report Authenticated By: Burman Nieves, M.D.    Ct Head Wo Contrast  02/11/2013  *RADIOLOGY REPORT*  Clinical Data: Altered mental status; right-sided weakness  CT HEAD WITHOUT CONTRAST  Technique:  Contiguous axial images were obtained from the base of the skull through the vertex without contrast.  Comparison:  February 10, 2013  Findings:  There is moderate diffuse atrophy which is stable.  The previously noted left cerebellar infarct is stable in distribution, effacing most of the left cerebellum with some sparing of the anterior superior left cerebellum.  There is moderate mass effect upon the fourth ventricle with effacement of portions of the fourth ventricle, stable.  There is no new mass effect seen in the posterior fossa.  There is patchy small vessel disease throughout the centra semiovale bilaterally, stable, more severe on the left than on the right.  There is evidence of a prior infarct involving the superior left lentiform nucleus.  There is a small prior infarct in the right caudate nucleus head region.  There is no new gray-white compartment lesions.  There is a stable small calcified posterior right parafalcine meningioma without surrounding edema measuring 9 x 6 mm, a finding not felt to have clinical significance.  There is no midline shift or extra-axial fluid.  There is no acute hemorrhage.  Bony calvarium appears intact.  Mastoids are clear.  IMPRESSION:  Stable appearing left cerebellar infarct causing extensive cytotoxic edema.  There is localized mass effect on the fourth ventricle, stable.  There is no new infarct  compared to recent prior study.  No acute hemorrhage.  Underlying atrophy.   Original Report Authenticated By: Bretta Bang, M.D.    US Abdomen Complete  02/10/2013  *RADIOLOGY REPORT*  Clinical Data:  Leukocytosis.  Elevated amylase.  Renal insufficiency with creatinine 2.53.  COMPLETE ABDOMINAL ULTRASOUND  Comparison:  None.  Findings:  Gallbladder:  Approximate 7 mm shadowing gallstone in the neck of the gallbladder which did not move with changes in patient positioning.  No gallbladder wall thickening or pericholecystic fluid.  Negative sonographic Murphy's sign according to the ultrasound technologist.  Common bile duct:  Normal in caliber with maximum diameter approximating 5 mm.  The distal duct was obscured by duodenal bowel gas.  Liver:  Normal size and echotexture without focal parenchymal abnormality.  Patent portal vein with hepatopetal flow.  IVC:  Patent.  Pancreas:  Although the pancreas is difficult to visualize in its entirety, no focal pancreatic abnormality is identified.  The distal body and tail were obscured by overlying bowel gas.  Spleen:  Normal size and echotexture without focal parenchymal abnormality.  Right Kidney:  No hydronephrosis.  Well-preserved cortex.  Normal parenchymal echotexture.  Approximate 3 cm simple cyst arising from the upper pole.  No significant focal parenchymal abnormality. Approximately 12.2 cm in length.  Left Kidney:  No hydronephrosis.  Severe diffuse cortical thinning with echogenic parenchyma.  No focal parenchymal abnormality. Approximately 10.1 cm in length.  Abdominal aorta:  Only visualized in its midportion due to bowel gas, normal in caliber measuring 2.2 cm.  IMPRESSION:  1.  7 mm gallstone which is impacted in the gallbladder neck.  No sonographic evidence of acute cholecystitis. 2.  No biliary ductal dilation. 3.  Normal-appearing pancreatic head and proximal body.  The distal body and tail were obscured by overlying bowel gas. 3.  Atrophic left  kidney.  3 cm simple cyst in the upper pole of the otherwise normal-appearing right kidney.   Original Report Authenticated By: Hulan Saas, M.D.    Dg Swallowing [facility] Pathology  02/12/2013  Vivi Ferns McCoy, CCC-SLP     02/12/2013 11:53 AM Objective Swallowing Evaluation: Modified Barium Swallowing Study   Patient Details  Name: Bowen Kia MRN: [id number] Date of Birth: 10-07-1930  Today's Date: 02/12/2013 Time: 0865-7846 SLP Time Calculation (min): 17 min  Past Medical History:  Past Medical History  Diagnosis Date  . RBBB (right bundle branch block)   . AAA (abdominal aortic aneurysm)   . AMI (acute myocardial infarction)   . Diabetes mellitus   . Coronary artery disease   . Pacemaker   . Defibrillator activation   . Hypertension   . Heart disease   . Heart attack   . Wears dentures   . Hyperlipidemia   . Shortness of breath   . Kidney disease   . Hyperpotassemia   . Edema    Past Surgical History:  Past Surgical History  Procedure Laterality Date  . Coronary artery bypass graft  1990  . Icd  2011  . Hernia repair      umbilical and RIH  . Aneurysm coiling    . Rotator cuff repair      bilateral   HPI:  79 y.o. male presenting with acute onset nausea, vomiting  accompanying episodes of unresponsive. Initial head CT negative.  Repeat head CT indicated large left cerebellar CVA. ? left  brainstem infarct not seen on CT noted by MD.       Assessment / Plan / Recommendation Clinical Impression  Dysphagia Diagnosis: Mild oral phase dysphagia;Mild pharyngeal  phase dysphagia Clinical impression: Patient presents with a mild sensory-motor  based oropharyngeal dysphagia characterized by oral weakness  resulting in a mild oral transit delay with soft solids and  premature spillage of bolus over the base of the tongue.  Additonally, when combined with suspected sensory deficits  resulting from acute CVA, patient with a delayed swallow  initiation resulting in silent penetration of thin liquids. Chin  tuck  ineffective to prevent however cued throat clear  consistently clears penetrates and clinician provided verbal  cueing (moderate-max) for small, single sip increased airway  protection during today's study. Education complete with patient  regarding results of exam. Verbal reinforcement for use of  compensatory strategies provided. Patient able to verbalize  understanding of education. May advance to a regular diet, thin  liquids. SLP will f/u.     Treatment Recommendation  Therapy as outlined in treatment plan below    Diet Recommendation Regular;Thin liquid   Liquid Administration via: Cup;No straw Medication Administration: Whole meds with puree Supervision: Patient able to self feed;Full supervision/cueing  for compensatory strategies Compensations: Small sips/bites;Clear throat intermittently Postural Changes and/or Swallow Maneuvers: Seated upright 90  degrees    Other  Recommendations Oral Care Recommendations: Oral care BID   Follow Up Recommendations  Inpatient Rehab    Frequency and Duration min 2x/week  2 weeks   Pertinent Vitals/Pain None reported    SLP Swallow Goals Patient will utilize recommended strategies during swallow to  increase swallowing safety with: Supervision/safety Swallow Study Goal #2 - Progress:  (new goal)   General HPI: 79 y.o. male presenting with acute onset nausea,  vomiting accompanying episodes of unresponsive. Initial head CT  negative. Repeat head  CT indicated large left cerebellar CVA. ?  left brainstem infarct not seen on CT noted by MD.   Type of Study: Modified Barium Swallowing Study Reason for Referral: Objectively evaluate swallowing function Previous Swallow Assessment: Bedside swallow eval complete  3/18-recommended a dysphagia 3 diet, nectar thick liquids and MBS  to objectively evaluate function Diet Prior to this Study: Dysphagia 3 (soft);Nectar-thick liquids Temperature Spikes Noted: No Respiratory Status: Room air History of Recent Intubation: No  Behavior/Cognition: Alert;Cooperative;Pleasant mood Oral Cavity - Dentition: Adequate natural dentition Oral Motor / Sensory Function: Impaired - see Bedside swallow  eval Self-Feeding Abilities: Able to feed self Patient Positioning: Upright in chair Baseline Vocal Quality: Clear Volitional Cough: Strong Volitional Swallow: Able to elicit Anatomy: Within functional limits Pharyngeal Secretions: Not observed secondary MBS    Reason for Referral Objectively evaluate swallowing function   Oral Phase Oral Preparation/Oral Phase Oral Phase: Impaired Oral - Thin Oral - Thin Cup:  (premature spillage of bolus) Oral - Thin Straw:  (premature spillage of bolus) Oral - Solids Oral - Puree: Lingual pumping Oral - Mechanical Soft: Lingual pumping Oral - Pill: Lingual pumping Oral Phase - Comment Oral Phase - Comment: remains functional   Pharyngeal Phase Pharyngeal Phase Pharyngeal Phase: Impaired Pharyngeal - Thin Pharyngeal - Thin Cup: Delayed swallow initiation;Premature  spillage to pyriform sinuses;Penetration/Aspiration during  swallow;Compensatory strategies attempted (Comment) (chin tuck  ineffective; small sips effective to prevent pen. ) Penetration/Aspiration details (thin cup): Material enters  airway, remains ABOVE vocal cords and not ejected out (ejected  with cued throat clear) Pharyngeal - Thin Straw: Delayed swallow initiation;Premature  spillage to pyriform sinuses;Penetration/Aspiration during  swallow;Compensatory strategies attempted (Comment) (chin tuck  ineffective to prevent penetration) Penetration/Aspiration details (thin straw): Material enters  airway, remains ABOVE vocal cords and not ejected out (ejected  with cued throat clear) Pharyngeal - Solids Pharyngeal - Puree: Delayed swallow initiation;Premature spillage  to valleculae Pharyngeal - Mechanical Soft: Delayed swallow  initiation;Premature spillage to valleculae Pharyngeal - Pill: Delayed swallow initiation;Premature spillage  to valleculae  (provided whole with pureed solid)  Cervical Esophageal Phase    GO  Ferdinand Lango MA, CCC-SLP [phone]   Cervical Esophageal Phase Cervical Esophageal Phase: [facility]         McCoy Leah [name] 02/12/2013, 11:53 AM      Microbiology: No results found for this or any previous visit (from the past 240 hour(s)).   Labs: Basic Metabolic Panel:  Recent Labs Lab 02/09/13 1657 02/11/13 0530 02/12/13 0349 02/13/13 0320  NA 135 136 136 136  K 4.1 4.3 4.0 3.8  CL 96 96 96 97  CO2 28 27 31 30   GLUCOSE 172* 188* 155* 124*  BUN 54* 50* 52* 46*  CREATININE 2.53* 2.39* 2.53* 2.32*  CALCIUM 8.9 8.8 8.8 8.7   Liver Function Tests:  Recent Labs Lab 02/09/13 2003  AST 22  ALT 14  ALKPHOS 66  BILITOT 0.5  PROT 8.3  ALBUMIN 3.9    Recent Labs Lab 02/09/13 2003  LIPASE 33  AMYLASE 106*   No results found for this basename: AMMONIA,  in the last 168 hours CBC:  Recent Labs Lab 02/09/13 1645 02/13/13 0320  WBC 12.1* 5.3  HGB 12.7* 10.9*  HCT 37.3* 31.8*  MCV 87.1 85.9  PLT 189 120*   Cardiac Enzymes: No results found for this basename: CKTOTAL, CKMB, CKMBINDEX, TROPONINI,  in the last 168 hours BNP: BNP (last 3 results) No results found for this basename: PROBNP,  in the  last 8760 hours CBG:  Recent Labs Lab 02/13/13 0816 02/13/13 1137 02/13/13 1625 02/13/13 1959 02/14/13 0740  GLUCAP 140* 140* 125* 187* 134*       Signed:  Deeya Richeson  [facility] 02/14/2013, 10:00 AM

## 2013-02-14 NOTE — Interval H&P Note (Signed)
Jason Walker was admitted today to Inpatient Rehabilitation with the diagnosis of left cerebellar infarct The patient's history has been reviewed, patient examined, and there is no change in status.  Patient continues to be appropriate for intensive inpatient rehabilitation.  I have reviewed the patient's chart and labs.  Questions were answered to the patient's satisfaction.  SWARTZ,ZACHARY T 02/14/2013, 4:41 PM

## 2013-02-15 ENCOUNTER — Inpatient Hospital Stay (HOSPITAL_COMMUNITY): Payer: Medicare Other | Admitting: *Deleted

## 2013-02-15 ENCOUNTER — Inpatient Hospital Stay (HOSPITAL_COMMUNITY): Payer: Medicare Other | Admitting: Occupational Therapy

## 2013-02-15 ENCOUNTER — Inpatient Hospital Stay (HOSPITAL_COMMUNITY): Payer: Medicare Other

## 2013-02-15 DIAGNOSIS — I69993 Ataxia following unspecified cerebrovascular disease: Secondary | ICD-10-CM

## 2013-02-15 DIAGNOSIS — I6340 Cerebral infarction due to embolism of unspecified cerebral artery: Secondary | ICD-10-CM

## 2013-02-15 LAB — GLUCOSE, CAPILLARY
Glucose-Capillary: 78 mg/dL (ref 70–99)
Glucose-Capillary: 111 mg/dL — ABNORMAL HIGH (ref 70–99)
Glucose-Capillary: 142 mg/dL — ABNORMAL HIGH (ref 70–99)

## 2013-02-15 NOTE — Plan of Care (Signed)
Problem: RH BOWEL ELIMINATION Goal: RH STG MANAGE BOWEL WITH ASSISTANCE STG Manage Bowel with max Assistance.  Outcome: Not Progressing Enema given 3/21; continuing to monitor for results.   Goal: RH STG MANAGE BOWEL W/MEDICATION W/ASSISTANCE STG Manage Bowel with Medication with max Assistance.  Outcome: Not Progressing Enema given 3/21

## 2013-02-15 NOTE — Evaluation (Signed)
Speech Language Pathology Assessment and Plan  Patient Details  Name: Jason Walker MRN: [id number] Date of Birth: 1930/09/24  SLP Diagnosis: Dysarthria;Cognitive Impairments  Rehab Potential: Fair ELOS: 2 1/2 to 3 weeks   Today's Date: 02/15/2013 Time: 4098-1191 Time Calculation (min): 45 min  Problem List:  Patient Active Problem List  Diagnosis  . DM  . AMI  . CAD  . RBBB  . VENTRICULAR TACHYCARDIA, PAROXYSMAL  . Chronic systolic heart failure  . AAA  . AUTOMATIC IMPLANTABLE CARDIAC DEFIBRILLATOR SITU  . Atrial fibrillation  . CVA (cerebral infarction)  . DM type 1 (diabetes mellitus, type 1)  . CKD (chronic kidney disease) stage 4, GFR 15-29 ml/min   Past Medical History:  Past Medical History  Diagnosis Date  . RBBB (right bundle branch block)   . AAA (abdominal aortic aneurysm)   . AMI (acute myocardial infarction)   . Diabetes mellitus   . Coronary artery disease   . Pacemaker   . Defibrillator activation   . Hypertension   . Heart disease   . Heart attack   . Wears dentures   . Hyperlipidemia   . Shortness of breath   . Kidney disease   . Hyperpotassemia   . Edema     BLE  . Systolic CHF    Past Surgical History:  Past Surgical History  Procedure Laterality Date  . Coronary artery bypass graft  1990  . Icd  2011  . Hernia repair      umbilical and RIH  . Aneurysm coiling    . Rotator cuff repair      bilateral  . Lasik      with lens implant.     Assessment / Plan / Recommendation Clinical Impression  Patient is a 79 y.o. year old male with history of DM, CAD, A-Fib but no anticoagulation due to life threating GIB, ICD; who was admitted to [facility] on 02/09/13 with N/V, fall and unresponsiveness. Initial CCT negative. [facility]. 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 Abdominal U/S done due to elevated amylase and CKD. This  revealed 7 mm gallstone, no cholecystitis and atrophic left kidney. Carotid dopplers without ICA stenosis. 2D echo with EF 35% with diffuse hypokinesis and mild-moderate MVR. Neurology recommended continuing ASA/Plavix and close monitoring as at risk for hydrocephalus and increased ICP. Follow up CCT 03/18 with stable L-cerebellar infarct with extensive cytotoxic edema and mass effect. Placed on Depakote for headaches. Patient continues Dizziness with balance and gaze disturbances.   Mentation improving, but pt remains sleepy much of the day per spouse.  Moderately severe ataxic dysarthria noted with pt requiring max verbal cues to use compensatory strategies.  Pt's verbal responses to slp questions are in phrase or short sentence level only, he did not offer information and appeared with possible flat affect. Expressive language may be impacted by word finding deficits, thought discoordination and inattention.  Patient able to follow 3 step commands but required max verbal/tactile/visual cues for sustained level of alertness. Hearing loss impacts pt's ability to receive communication.   Per spouse, pt does not read at baseline, nor his he oriented to date.  Pt will benefit from skilled SLP for cognitive-linguistic and dysarthria treatment to maximizie function and decrease caregiver burden.  Anticipate pt to dc with 24/7 supervision.      SLP educated pt and spouse to compensation strategies for improving  speech intelligibility and to mutually approved goals for patient.      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  SLP Short Term Goal 2 (Week 1): Pt will increase verbal output to multiple sentence level during communication with moderate multimodal cues and 75% accuracy.   SLP Short Term Goal 3 (Week 1): Pt will increase phonatory amplitude to accomodate across his hospital room communication without listener straining and minimal verba/visual cues with 75% of opportunities.   SLP Short Term Goal 4 (Week 1): Pt will increase speech comprehensibility to 75% at phrase/sentence level with moderate multimodal cues.   SLP Short Term Goal 5 (Week 1): Pt will demonstrate sustained attention to functional basic task for 7 minutes with min question cues.    See FIM for current functional status Refer to Care Plan for Long Term Goals  Recommendations for other services: None  Discharge Criteria: Patient will be discharged from SLP if patient refuses treatment 3 consecutive times without medical reason, if treatment goals not met, if there is a change in medical status, if patient makes no progress towards goals or if patient is discharged from hospital.  The above assessment, treatment plan, treatment alternatives and goals were discussed and mutually agreed upon: by patient and family.    Donavan Burnet, MS [facility] SLP [phone]

## 2013-02-15 NOTE — Progress Notes (Signed)
Subjective/Complaints: A little nauseas last night. Feels better this am after bm.  A 12 point review of systems has been performed and if not noted above is otherwise negative.   Objective: Vital Signs: Blood pressure 123/68, pulse 73, temperature 98.2 F (36.8 C), temperature source Oral, resp. rate 17, height 5\' 5"  (1.651 m), weight 85.7 kg (188 lb 15 oz), SpO2 98.00%. No results found.  Recent Labs  02/13/13 0320  WBC 5.3  HGB 10.9*  HCT 31.8*  PLT 120*    Recent Labs  02/13/13 0320  NA 136  K 3.8  CL 97  GLUCOSE 124*  BUN 46*  CREATININE 2.32*  CALCIUM 8.7   CBG (last 3)   Recent Labs  02/14/13 1644 02/14/13 2056 02/15/13 0733  GLUCAP 116* 136* 78    Wt Readings from Last 3 Encounters:  02/15/13 85.7 kg (188 lb 15 oz)  02/14/13 85.684 kg (188 lb 14.4 oz)  02/04/13 87.091 kg (192 lb)    Physical Exam:  Constitutional: He is oriented to person, place, and time. He appears well-developed and well-nourished. He appears lethargic. He is easily aroused.  HENT:  Head: Normocephalic and atraumatic.  Left Ear: External ear normal.  Eyes: Conjunctivae and EOM are normal. Pupils are equal, round, and reactive to light.  Neck: Normal range of motion. No JVD present. No tracheal deviation present. No thyromegaly present.  Cardiovascular: Normal rate. An irregularly irregular rhythm present. Exam reveals no gallop and no friction rub.  No murmur heard.  Pulmonary/Chest: Effort normal and breath sounds normal. No respiratory distress. He has no wheezes. He has no rales. He exhibits no tenderness.  Abdominal: bell soft, NT. Bowel sounds positive.  Musculoskeletal: He exhibits tenderness. He exhibits no edema.  Lymphadenopathy:  He has no cervical adenopathy.  Neurological: He is oriented to person, place, and time and easily aroused. He appears lethargic.  HOH. Speech with moderate to severe dysarthria. Left field cut with inattention.Tends to keep left eye  closed--diplopia?--inconsistent tracking on EOM testing. Impaired attention with decreased insight and poor awareness of deficits.persistent ataxia LUE and decrease in fine motor control LLE. Right arm limited at shoulder due to RTC issue. Strength grossly 4/5 except for pain inhibition.  Skin: Skin is warm and dry.  Psychiatric:  Fair insight and awareness.  .    Assessment/Plan: 1. Functional deficits secondary to thrombotic left cerebellar infarct which require 3+ hours per day of interdisciplinary therapy in a comprehensive inpatient rehab setting. Physiatrist is providing close team supervision and 24 hour management of active medical problems listed below. Physiatrist and rehab team continue to assess barriers to discharge/monitor patient progress toward functional and medical goals. FIM:                   Comprehension Comprehension Mode: Auditory Comprehension: 4-Understands basic 75 - 89% of the time/requires cueing 10 - 24% of the time  Expression Expression Mode: Verbal Expression: 4-Expresses basic 75 - 89% of the time/requires cueing 10 - 24% of the time. Needs helper to occlude trach/needs to repeat words.  Social Interaction Social Interaction: 4-Interacts appropriately 75 - 89% of the time - Needs redirection for appropriate language or to initiate interaction.  Problem Solving Problem Solving: 4-Solves basic 75 - 89% of the time/requires cueing 10 - 24% of the time  Memory Memory: 4-Recognizes or recalls 75 - 89% of the time/requires cueing 10 - 24% of the time Medical Problem List and Plan:  1. DVT Prophylaxis/Anticoagulation: Pharmaceutical: Lovenox  2. Pain Management: on Depakote 500 g for headaches. Will decrease dose to 250 mg and change timing to bedtime.  3. Mood: Seems to be distracted with impaired awareness. Will have LCSW follow up for evaluation.  4. Neuropsych: This patient Is not capable of making decisions on his/her own behalf.  5. HTN:  Monitor with bid checks. Continue  6. CAD/Chronic systolic CHF/ICM with ICD: Check daily weights.  . Will titrated diuretics as needed to home dose.  7. CKD Stage IV: RUE graft placed last fall. Was on lasix 80 mg tid with FR [id number] cc/day. Baseline weight at 185-190 Lbs. Baseline Cr<3.0. --follow weights and i's and o's 8. DM type 2: Hgb A1C @ 10. Monitor with AC/HS cbg checks. Po intake poor at this time.Continue lantus 10 units--was on 35 units at home.. Will continue SSI for elevated BS.   -fair control at present 9. Atrial Fibrillation with history of ventricular arrhthymias: monitor HR with bid checks. Continue coreg bid 10. Nausea: likely due to constipation. Monitor for now. Work on regular bm's now that he's empty.   LOS (Days) 1 A FACE TO FACE EVALUATION WAS PERFORMED  Jason Walker T 02/15/2013 8:28 AM

## 2013-02-15 NOTE — Progress Notes (Signed)
Overall Plan of Care [facility]) Patient Details Name: Jason Walker MRN: [id number] DOB: 12/29/1929  Diagnosis:  Left cerebellar embolic infarct  Co-morbidities: Afib  Functional Problem List  Patient demonstrates impairments in the following areas: Balance, Bladder, Bowel, Cognition, Endurance, Medication Management, Motor, Pain, Safety and Skin Integrity  Basic ADL's: eating, grooming, bathing, dressing and toileting Advanced ADL's: simple meal preparation  Transfers:  bed mobility, bed to chair, toilet, tub/shower, car and furniture Locomotion:  ambulation, wheelchair mobility and stairs  Additional Impairments:  Functional use of upper extremity and Communication  comprehension and expression  Anticipated Outcomes Item Anticipated Outcome  Eating/Swallowing    Basic self-care  supervision  Tolieting  supervision  Bowel/Bladder  Will become continent of B/B.   Transfers  Supervision with LRAD  Locomotion  Supervision for ambulation x150' with LRAD; Supervision for wheelchair propulsion x150'  Communication  Supervision  Cognition  Supervison  Pain  Less than or equal to 3.  Safety/Judgment  Supervision  Other     Therapy Plan: PT Intensity: Minimum of 1-2 x/day ,45 to 90 minutes PT Frequency: 5 out of 7 days PT Duration Estimated Length of Stay: 2.5-3 weeks OT Intensity: Minimum of 1-2 x/day, 45 to 90 minutes OT Frequency: 5 out of 7 days OT Duration/Estimated Length of Stay: 2 1/2 to 3 weeks SLP Intensity: Minumum of 1-2 x/day, 30 to 90 minutes SLP Frequency: 5 out of 7 days SLP Duration/Estimated Length of Stay: 2 1/2 to 3 weeks    Team Interventions: Item RN PT OT SLP SW TR Other  Self Care/Advanced ADL Retraining   x      Neuromuscular Re-Education  x x      Therapeutic Activities  x x      UE/LE Strength Training/ROM  x x      UE/LE Coordination Activities  x x      Visual/Perceptual Remediation/Compensation  x x      DME/Adaptive Equipment  Instruction  x x      Therapeutic Exercise  x x      Balance/Vestibular Training  x x      Patient/Family Education x x x x     Cognitive Remediation/Compensation  x x x     Functional Mobility Training  x x      Ambulation/Gait Training  x       Stair Training  x       Wheelchair Propulsion/Positioning  x       Functional Tourist information centre manager Reintegration  x x x     Dysphagia/Aspiration Landscape architect Facilitation    x     Bladder Management x        Bowel Management x        Disease Management/Prevention x        Pain Management x x x      Medication Management x        Skin Care/Wound Management x  x      Splinting/Orthotics   x      Discharge Planning  x x x     Psychosocial Support  x x x                            Team Discharge Planning: Destination: PT-Home ,OT- Home , SLP- Home Projected Follow-up: PT-Home health PT, OT-   , SLP-  Home Health SLP Projected Equipment Needs: PT-Wheelchair (measurements);Wheelchair cushion (measurements);Rolling walker with 5" wheels, OT-  , SLP-  Patient/family involved in discharge planning: PT- Patient;Family member/caregiver,  OT-Patient;Family member/caregiver, SLP-Patient;Family member/caregiverPatient; Family member  MD ELOS: 10-14 days Medical Rehab Prognosis:  Good Assessment: 79 yo male with A fib off anti coag due to GIB, now with acute R cerebellar infarct.  Pt requires 24/7 rehab RN, MD, CIR level PT,OT.  Team to focus on balance and coordination and safety as well as ADLs.  Goals at Supervision level for ADL and mobility.    See Team Conference Notes for weekly updates to the plan of care

## 2013-02-15 NOTE — Plan of Care (Deleted)
Problem: RH Memory Goal: LTG Patient will demonstrate ability for day to day (SLP) LTG: Patient will demonstrate ability for day to day recall/carryover during cognitive/linguistic activities with assist (SLP) Pt can not read, memory aid via alternative means

## 2013-02-15 NOTE — Evaluation (Signed)
Physical Therapy Assessment and Plan  Patient Details  Name: Jason Walker MRN: [id number] Date of Birth: [birth year]  PT Diagnosis: Abnormal posture, Abnormality of gait, Ataxia, Cognitive deficits, Coordination disorder, Hemiparesis dominant, Impaired cognition and Muscle weakness Rehab Potential: Good ELOS: 2.5-3 weeks   Today's Date: 02/15/2013 Time: 1100-1200 Time Calculation (min): 60 min  Problem List:  Patient Active Problem List  Diagnosis  . DM  . AMI  . CAD  . RBBB  . VENTRICULAR TACHYCARDIA, PAROXYSMAL  . Chronic systolic heart failure  . AAA  . AUTOMATIC IMPLANTABLE CARDIAC DEFIBRILLATOR SITU  . Atrial fibrillation  . CVA (cerebral infarction)  . DM type 1 (diabetes mellitus, type 1)  . CKD (chronic kidney disease) stage 4, GFR 15-29 ml/min    Past Medical History:  Past Medical History  Diagnosis Date  . RBBB (right bundle branch block)   . AAA (abdominal aortic aneurysm)   . AMI (acute myocardial infarction)   . Diabetes mellitus   . Coronary artery disease   . Pacemaker   . Defibrillator activation   . Hypertension   . Heart disease   . Heart attack   . Wears dentures   . Hyperlipidemia   . Shortness of breath   . Kidney disease   . Hyperpotassemia   . Edema     BLE  . Systolic CHF    Past Surgical History:  Past Surgical History  Procedure Laterality Date  . Coronary artery bypass graft  1990  . Icd  2011  . Hernia repair      umbilical and RIH  . Aneurysm coiling    . Rotator cuff repair      bilateral  . Lasik      with lens implant.     Assessment & Plan Clinical Impression:  Jason Walker is a 79 y.o. male with history of DM, CAD, A-Fib but no anticoagulation due to life threating GIB, ICD; who was admitted to [facility] on 02/09/13 with N/V, fall and unresponsiveness. Initial CCT negative. [facility] CTA head revealed moderate to large left posterior fossa infarct involving a majority of left  cerebellum with mass effect. Abdominal U/S done due to elevated amylase and CKD. This revealed 7 mm gallstone, no cholecystitis and atrophic left kidney. Carotid dopplers without ICA stenosis. 2D echo with EF 35% with diffuse hypokinesis and mild-moderate MVR. Neurology recommended continuing ASA/Plavix and close monitoring as at risk for hydrocephalus and increased ICP. Follow up CCT 03/18 with stable L-cerebellar infarct with extensive cytotoxic edema and mass effect. Placed on Depakote for headaches. Patient continues Dizziness with balance and gaze disturbances. Mentation improving. Patient transferred to CIR on 02/14/2013 .   Patient currently requires mod with mobility secondary to muscle weakness, impaired timing and sequencing, unbalanced muscle activation, ataxia, decreased coordination and decreased motor planning, decreased motor planning, decreased initiation, decreased attention, decreased awareness, decreased problem solving, decreased safety awareness, decreased memory and delayed processing, na and decreased sitting balance, decreased standing balance, decreased postural control and decreased balance strategies.  Prior to hospitalization, patient was independent  with mobility and lived with Spouse in a House home.  Home access is 1Stairs to enter.  Patient will benefit from skilled PT intervention to maximize safe functional mobility, minimize fall risk and decrease caregiver burden for planned discharge home with 24 hour supervision.  Anticipate patient will benefit from follow up HH at discharge.  PT - End of Session Activity Tolerance: Tolerates 30+ min  activity with multiple rests Endurance Deficit: No PT Assessment Rehab Potential: Good Barriers to Discharge: None PT Plan PT Intensity: Minimum of 1-2 x/day ,45 to 90 minutes PT Frequency: 5 out of 7 days PT Duration Estimated Length of Stay: 2.5-3 weeks PT Treatment/Interventions: Ambulation/gait training;Balance/vestibular  training;Cognitive remediation/compensation;Community reintegration;Discharge planning;Neuromuscular re-education;Functional mobility training;DME/adaptive equipment instruction;Pain management;Patient/family education;Psychosocial support;UE/LE Coordination activities;UE/LE Strength taining/ROM;Therapeutic Exercise;Therapeutic Activities;Stair training;Visual/perceptual remediation/compensation;Wheelchair propulsion/positioning PT Recommendation Recommendations for Other Services: Speech consult Follow Up Recommendations: Home health PT Patient destination: Home Equipment Recommended: Wheelchair (measurements);Wheelchair cushion (measurements);Rolling walker with 5" wheels Equipment Details: DME assessment ongoing; Recommendations TBD upon discharge  Skilled Therapeutic Intervention Gait training x12' with rolling walker and mod assist. Patient requires significant manual facilitation for lateral weight shifts to assist with increasing bilateral step lengths. Wheelchair mobility x150' with B LE and min assist.  PT Evaluation Precautions/Restrictions Precautions Precautions: Fall Precaution Comments: decreased sitting and standing balance Restrictions Weight Bearing Restrictions: No General Chart Reviewed: Yes  Pain Pain Assessment Pain Assessment: No/denies pain Pain Score: 0-No pain Home Living/Prior Functioning Home Living Lives With: Spouse Available Help at Discharge: Family;Available 24 hours/day Type of Home: House Home Access: Stairs to enter [employer] of Steps: 1 Entrance Stairs-Rails: None Home Layout: Two level;Able to live on main level with bedroom/bathroom Alternate Level Stairs-Number of Steps: 12; Second level is basement, does not have to be able to access Alternate Level Stairs-Rails: Right (R handrail descending) Bathroom Shower/Tub: Tub/shower unit;Curtain Bathroom Toilet: Handicapped height Bathroom Accessibility: Yes How Accessible: Accessible  via wheelchair;Accessible via walker Home Adaptive Equipment: Built-in shower seat Additional Comments: Do not own DME, but son will be installing built-in shower seat and any necessary ramps/handrails Prior Function Level of Independence: Independent with transfers;Independent with gait;Independent with basic ADLs Able to Take Stairs?: Yes Driving: Yes Vocation: Retired Leisure: Hobbies-yes (Comment) Comments: Wood shop Vision/Perception  Vision - History Baseline Vision: Wears glasses only for reading Visual History: Cataracts Patient Visual Report: No change from baseline  Cognition Overall Cognitive Status: Appears within functional limits for tasks assessed Arousal/Alertness: Awake/alert Orientation Level: Oriented X4 Safety/Judgment: Impaired Sensation Sensation Light Touch: Appears Intact Proprioception: Appears Intact Additional Comments: Proprioception intact at B ankles and great toes. Coordination Gross Motor Movements are Fluid and Coordinated: No Fine Motor Movements are Fluid and Coordinated: No Coordination and Movement Description: Decreased speed and accuracy with rapid alternating movements. Heel Shin Test: Able to complete bilaterally, noted slight dysmetria on R LE Motor  Motor Motor: Ataxia;Abnormal postural alignment and control Motor - Skilled Clinical Observations: Significant truncal ataxia in standing  Mobility Transfers Sit to Stand: 3: Mod assist;With upper extremity assist;With armrests;From chair/3-in-1 Sit to Stand Details: Tactile cues for placement;Tactile cues for initiation;Manual facilitation for weight shifting;Verbal cues for precautions/safety;Verbal cues for technique;Verbal cues for sequencing;Visual cues/gestures for sequencing Sit to Stand Details (indicate cue type and reason): Patient demonstrates delayed initiation/processing, requires increased time to complete. Stand to Sit: Without upper extremity assist;With armrests;3: Mod  assist;To chair/3-in-1 Stand to Sit Details (indicate cue type and reason): Tactile cues for placement;Tactile cues for initiation;Manual facilitation for weight shifting;Verbal cues for precautions/safety;Verbal cues for technique;Verbal cues for sequencing;Visual cues/gestures for sequencing Stand to Sit Details: Patient demonstrates delayed initiation/processing, requires increased time to complete. Locomotion  Ambulation Ambulation: Yes Ambulation/Gait Assistance: 3: Mod assist Ambulation Distance (Feet): 12 Feet Assistive device: Rolling walker Ambulation/Gait Assistance Details: Tactile cues for posture;Tactile cues for initiation;Manual facilitation for weight shifting;Verbal cues for gait pattern;Verbal cues for precautions/safety;Verbal cues for sequencing;Verbal cues for  technique Ambulation/Gait Assistance Details: Patient demonstrates decreased initiation/delayed processing and requires extra time to complete tasks. During gait training, patient benefits for verbal cues for "left foot, right foot" to assist with stepping. Patient demonstrates significant truncal ataxia in standing and during gait. Gait Gait: Yes Gait Pattern: Impaired Gait Pattern: Step-to pattern;Decreased stride length;Wide base of support;Shuffle;Trunk flexed;Ataxic;Decreased hip/knee flexion - left;Decreased dorsiflexion - right;Decreased dorsiflexion - left;Decreased hip/knee flexion - right;Decreased weight shift to left;Lateral trunk lean to right Stairs / Additional Locomotion Stairs: Yes Stairs Assistance: 3: Mod assist Stairs Assistance Details: Tactile cues for initiation;Tactile cues for posture;Manual facilitation for weight shifting;Verbal cues for sequencing;Verbal cues for technique;Verbal cues for precautions/safety;Visual cues/gestures for sequencing Stairs Assistance Details (indicate cue type and reason): Patient requires verbal cues for advancement of B UE on handrails to assist with anterior  weight shift and maintaining COG over BOS when descending stairs. Stair Management Technique: Two rails;Step to pattern;Forwards Number of Stairs: 2 Height of Stairs: 8 Corporate treasurer: Yes Wheelchair Assistance: 4: Administrator, sports Details: Tactile cues for initiation;Manual facilitation for placement;Verbal cues for precautions/safety;Verbal cues for sequencing;Verbal cues for Diplomatic Services operational officer: Both upper extremities;Both lower extermities Wheelchair Parts Management: Needs assistance Distance: 150 (increased time to complete)  Trunk/Postural Assessment  Cervical Assessment Cervical Assessment: Within Functional Limits Thoracic Assessment Thoracic Assessment: Within Functional Limits Lumbar Assessment Lumbar Assessment: Within Functional Limits Postural Control Postural Control: Deficits on evaluation Trunk Control: Significant truncal ataxia in standing and during gait. Postural Limitations: R lateral trunk lean in standing  Balance Balance Balance Assessed: Yes Static Sitting Balance Static Sitting - Balance Support: Feet supported;Bilateral upper extremity supported Static Sitting - Level of Assistance: 5: Stand by assistance Static Sitting - Comment/# of Minutes: approximately 5 min Static Standing Balance Static Standing - Balance Support: Bilateral upper extremity supported;During functional activity Static Standing - Level of Assistance: 3: Mod assist Static Standing - Comment/# of Minutes: approximately 1 min prior to gait training Extremity Assessment  RLE Assessment RLE Assessment: Exceptions to [facility] RLE Strength RLE Overall Strength: Deficits RLE Overall Strength Comments: Grossly 4-/5 overall LLE Assessment LLE Assessment: Exceptions to [facility] LLE Strength LLE Overall Strength: Deficits LLE Overall Strength Comments: Grossly 3+/5  FIM:  FIM - Bed/Chair Transfer Bed/Chair Transfer Assistive Devices: Arm  rests Bed/Chair Transfer: 3: Bed > Chair or W/C: Mod A (lift or lower assist);3: Chair or W/C > Bed: Mod A (lift or lower assist) FIM - Locomotion: Wheelchair Distance: 150 (increased time to complete) Locomotion: Wheelchair: 4: Travels 150 ft or more: maneuvers on rugs and over door sillls with minimal assistance (Pt.>75%) FIM - Locomotion: Ambulation Locomotion: Ambulation Assistive Devices: Designer, industrial/product Ambulation/Gait Assistance: 3: Mod assist Locomotion: Ambulation: 1: Travels less than 50 ft with moderate assistance (Pt: 50 - 74%) FIM - Locomotion: Stairs Locomotion: Building control surveyor: Hand rail - 2 Locomotion: Stairs: 1: Up and Down < 4 stairs with moderate assistance (Pt: 50 - 74%)   Refer to Care Plan for Long Term Goals  Recommendations for other services: None  Discharge Criteria: Patient will be discharged from PT if patient refuses treatment 3 consecutive times without medical reason, if treatment goals not met, if there is a change in medical status, if patient makes no progress towards goals or if patient is discharged from hospital.  The above assessment, treatment plan, treatment alternatives and goals were discussed and mutually agreed upon: by patient and by family  Chipper Herb. Lamaya Hyneman, PT, DPT  02/15/2013, 12:20 PM

## 2013-02-15 NOTE — Evaluation (Signed)
Occupational Therapy Assessment and Plan  Patient Details  Name: Jason Walker MRN: [id number] Date of Birth: [birth year]  OT Diagnosis: abnormal posture, ataxia and muscle weakness (generalized) Rehab Potential: Rehab Potential: Good ELOS: 2 1/2 to 3 weeks   Today's Date: 02/15/2013 Time: 1100-1200 Time Calculation (min): 60 min  Problem List:  Patient Active Problem List  Diagnosis  . DM  . AMI  . CAD  . RBBB  . VENTRICULAR TACHYCARDIA, PAROXYSMAL  . Chronic systolic heart failure  . AAA  . AUTOMATIC IMPLANTABLE CARDIAC DEFIBRILLATOR SITU  . Atrial fibrillation  . CVA (cerebral infarction)  . DM type 1 (diabetes mellitus, type 1)  . CKD (chronic kidney disease) stage 4, GFR 15-29 ml/min    Past Medical History:  Past Medical History  Diagnosis Date  . RBBB (right bundle branch block)   . AAA (abdominal aortic aneurysm)   . AMI (acute myocardial infarction)   . Diabetes mellitus   . Coronary artery disease   . Pacemaker   . Defibrillator activation   . Hypertension   . Heart disease   . Heart attack   . Wears dentures   . Hyperlipidemia   . Shortness of breath   . Kidney disease   . Hyperpotassemia   . Edema     BLE  . Systolic CHF    Past Surgical History:  Past Surgical History  Procedure Laterality Date  . Coronary artery bypass graft  1990  . Icd  2011  . Hernia repair      umbilical and RIH  . Aneurysm coiling    . Rotator cuff repair      bilateral  . Lasik      with lens implant.     Assessment & Plan Clinical Impression: Patient is a 79 y.o. year old male with history of DM, CAD, A-Fib but no anticoagulation due to life threating GIB, ICD; who was admitted to [facility] on 02/09/13 with N/V, fall and unresponsiveness. Initial CCT negative. [facility] 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 Abdominal U/S done due to elevated  amylase and CKD. This revealed 7 mm gallstone, no cholecystitis and atrophic left kidney. Carotid dopplers without ICA stenosis. 2D echo with EF 35% with diffuse hypokinesis and mild-moderate MVR. Neurology recommended continuing ASA/Plavix and close monitoring as at risk for hydrocephalus and increased ICP. Follow up CCT 03/18 with stable L-cerebellar infarct with extensive cytotoxic edema and mass effect. Placed on Depakote for headaches. Patient continues Dizziness with balance and gaze disturbances. Mentation improving  Patient transferred to [facility] on 02/14/2013 .    Patient currently requires mod to max A  with basic self-care skills and basic mobility secondary to muscle weakness, decreased cardiorespiratoy endurance, impaired timing and sequencing, abnormal tone, unbalanced muscle activation, ataxia and decreased coordination, decreased visual motor skills, delayed processing, central origin and decreased sitting balance, decreased standing balance, decreased postural control and decreased balance strategies.  Prior to hospitalization, patient could complete ADLs with independent .  Patient will benefit from skilled intervention to decrease level of assist with basic self-care skills and increase independence with basic self-care skills prior to discharge home with care partner.  Anticipate patient will require 24 hour supervision and follow up home health.  OT - End of Session Activity Tolerance: Tolerates 30+ min activity with multiple rests Endurance Deficit: Yes OT Assessment Rehab Potential: Good OT Plan OT  Intensity: Minimum of 1-2 x/day, 45 to 90 minutes OT Frequency: 5 out of 7 days OT Duration/Estimated Length of Stay: 2 1/2 to 3 weeks OT Treatment/Interventions: Balance/vestibular training;Cognitive remediation/compensation;Discharge planning;Community reintegration;DME/adaptive equipment instruction;Functional mobility training;Pain management;Neuromuscular re-education;Patient/family  education;Self Care/advanced ADL retraining;Therapeutic Activities;UE/LE Strength taining/ROM;Splinting/orthotics;Therapeutic Exercise;UE/LE Coordination activities;Visual/perceptual remediation/compensation OT Recommendation Patient destination: Home   Skilled Therapeutic Intervention   OT Evaluation Precautions/Restrictions  Precautions Precautions: Fall Precaution Comments: decreased sitting and standing balance Restrictions Weight Bearing Restrictions: No General Chart Reviewed: Yes Family/Caregiver Present: Yes    Pain Pain Assessment Pain Assessment: No/denies pain Pain Score: 0-No pain Home Living/Prior Functioning Home Living Lives With: Spouse Available Help at Discharge: Family;Available 24 hours/day Type of Home: House Home Access: Stairs to enter [employer] of Steps: 1 Entrance Stairs-Rails: None Home Layout: Two level;Able to live on main level with bedroom/bathroom Alternate Level Stairs-Number of Steps: 12; Second level is basement, does not have to be able to access Alternate Level Stairs-Rails: Right Bathroom Shower/Tub: Tub/shower unit;Curtain Bathroom Toilet: Handicapped height Bathroom Accessibility: Yes How Accessible: Accessible via wheelchair;Accessible via walker Home Adaptive Equipment: Built-in shower seat Additional Comments: Do not own DME, but son will be installing built-in shower seat and any necessary ramps/handrails Prior Function Level of Independence: Independent with transfers;Independent with gait;Independent with basic ADLs Able to Take Stairs?: Yes Driving: Yes Vocation: Retired Leisure: Hobbies-yes (Comment) Comments: Wood shop ADL  see FIM Vision/Perception  Vision - History Baseline Vision: Wears glasses only for reading Visual History: Cataracts Patient Visual Report: No change from baseline Vision - Assessment Eye Alignment: Within Functional Limits Vision Assessment: Vision tested Ocular Range of  Motion: Within Functional Limits Alignment/Gaze Preference: Within Defined Limits Tracking/Visual Pursuits: Decreased smoothness of horizontal tracking Saccades: Decreased speed of saccadic movement;Overshoots Pt with slight nystagmus (2-3 beats) towards the left with tracking Perception Perception: Impaired Praxis Praxis: Intact  Cognition Overall Cognitive Status: Appears within functional limits for tasks assessed Arousal/Alertness: Awake/alert Orientation Level: Oriented X4 Attention: Sustained Awareness: Impaired Awareness Impairment: Emergent impairment Safety/Judgment: Impaired Sensation Sensation Light Touch: Appears Intact Proprioception: Appears Intact Additional Comments: Proprioception intact at B ankles and great toes. Coordination Gross Motor Movements are Fluid and Coordinated: No Fine Motor Movements are Fluid and Coordinated: No Coordination and Movement Description: Decreased speed and accuracy with rapid alternating movements. Finger Nose Finger Test: able to do bilaterally right better than left; pt went slower and more controlled with right- compensation Heel Shin Test: Able to complete bilaterally, noted slight dysmetria on R LE Motor  Motor Motor: Ataxia;Abnormal postural alignment and control Motor - Skilled Clinical Observations: Significant truncal ataxia in standing Mobility  Bed Mobility Supine to Sit: 4: Min assist;With rails;HOB elevated Transfers Sit to Stand: 3: Mod assist;With upper extremity assist;With armrests;From chair/3-in-1 Sit to Stand Details: Tactile cues for placement;Tactile cues for initiation;Manual facilitation for weight shifting;Verbal cues for precautions/safety;Verbal cues for technique;Verbal cues for sequencing;Visual cues/gestures for sequencing Sit to Stand Details (indicate cue type and reason): Patient demonstrates delayed initiation/processing, requires increased time to complete. Stand to Sit: 4: Min assist Stand to  Sit Details (indicate cue type and reason): Tactile cues for placement;Tactile cues for initiation;Manual facilitation for weight shifting;Verbal cues for precautions/safety;Verbal cues for technique;Verbal cues for sequencing;Visual cues/gestures for sequencing Stand to Sit Details: Patient demonstrates delayed initiation/processing, requires increased time to complete.  Trunk/Postural Assessment  Cervical Assessment Cervical Assessment: Within Functional Limits Thoracic Assessment Thoracic Assessment: Within Functional Limits Lumbar Assessment Lumbar Assessment: Within Functional Limits Postural Control Postural Control: Deficits on  evaluation Trunk Control: Significant truncal ataxia in standing and during gait. Postural Limitations: R lateral trunk lean in standing  Balance Balance Balance Assessed: Yes Static Sitting Balance Static Sitting - Balance Support: Feet supported;Bilateral upper extremity supported Static Sitting - Level of Assistance: 5: Stand by assistance Static Sitting - Comment/# of Minutes: approximately 5 min Static Standing Balance Static Standing - Balance Support: Bilateral upper extremity supported;During functional activity Static Standing - Level of Assistance: 3: Mod assist Static Standing - Comment/# of Minutes: approximately 1 min prior to gait training Extremity/Trunk Assessment RUE Assessment RUE Assessment:  (grossly 3/5 , weak grip strength) LUE Assessment LUE Assessment:  (4/5)  FIM:  FIM - Banker Devices: Arm rests Bed/Chair Transfer: 3: Bed > Chair or W/C: Mod A (lift or lower assist);3: Chair or W/C > Bed: Mod A (lift or lower assist)   Refer to Care Plan for Long Term Goals  Recommendations for other services: Neuropsych  Discharge Criteria: Patient will be discharged from OT if patient refuses treatment 3 consecutive times without medical reason, if treatment goals not met, if there is a change in  medical status, if patient makes no progress towards goals or if patient is discharged from hospital.  The above assessment, treatment plan, treatment alternatives and goals were discussed and mutually agreed upon: by patient and by family  1:1 OT eval initiated with pt and pt's wife with OT goals, role and purpose discussed. Self care retraining at sink level with focus on bed mobility, transfers bed<w/c squat pivot, sit to stands, standing balance with and without UE support. Pt is more stable and demonstrates less ataxia with closed chained activities. Without UE support pt with strong forward weight shift with difficulty maintaining upright posture.   2nd session1:1 Pt missed 13 min due to pt fatigue. Pt was in bed and wife reported "he is just so tired" however pt had been incontinent of urine in bed and needed to change clothes. Focused on bed mobility to come to EOB, dynamic sitting balance, sit to stands, and standing balance without UE support for clothing management. Pt able to take side steps towards [facility] with mod A for better positioning in the bed.  Roney Mans [facility] 02/15/2013, 12:59 PM

## 2013-02-16 ENCOUNTER — Inpatient Hospital Stay (HOSPITAL_COMMUNITY): Payer: Medicare Other | Admitting: *Deleted

## 2013-02-16 LAB — URINE CULTURE

## 2013-02-16 LAB — GLUCOSE, CAPILLARY
Glucose-Capillary: 117 mg/dL — ABNORMAL HIGH (ref 70–99)
Glucose-Capillary: 125 mg/dL — ABNORMAL HIGH (ref 70–99)
Glucose-Capillary: 152 mg/dL — ABNORMAL HIGH (ref 70–99)
Glucose-Capillary: 108 mg/dL — ABNORMAL HIGH (ref 70–99)

## 2013-02-16 NOTE — Progress Notes (Signed)
Subjective/Complaints: Slept well. No complaints today. Denies nausea.  A 12 point review of systems has been performed and if not noted above is otherwise negative.   Objective: Vital Signs: Blood pressure 142/79, pulse 76, temperature 97.5 F (36.4 C), temperature source Oral, resp. rate 20, height 5\' 5"  (1.651 m), weight 85.7 kg (188 lb 15 oz), SpO2 96.00%. No results found. No results found for this basename: WBC, HGB, HCT, PLT,  in the last 72 hours No results found for this basename: NA, K, CL, CO, GLUCOSE, BUN, CREATININE, CALCIUM,  in the last 72 hours CBG (last 3)   Recent Labs  02/15/13 1625 02/15/13 2041 02/16/13 0709  GLUCAP 111* 142* 117*    Wt Readings from Last 3 Encounters:  02/15/13 85.7 kg (188 lb 15 oz)  02/14/13 85.684 kg (188 lb 14.4 oz)  02/04/13 87.091 kg (192 lb)    Physical Exam:  Constitutional: He is oriented to person, place, and time. He appears well-developed and well-nourished. He appears lethargic. He is easily aroused.  HENT:  Head: Normocephalic and atraumatic.  Left Ear: External ear normal.  Eyes: Conjunctivae and EOM are normal. Pupils are equal, round, and reactive to light.  Neck: Normal range of motion. No JVD present. No tracheal deviation present. No thyromegaly present.  Cardiovascular: Normal rate. An irregularly irregular rhythm present. Exam reveals no gallop and no friction rub.  No murmur heard.  Pulmonary/Chest: Effort normal and breath sounds normal. No respiratory distress. He has no wheezes. He has no rales. He exhibits no tenderness.  Abdominal: bell soft, NT. Bowel sounds positive.  Musculoskeletal: He exhibits tenderness. He exhibits no edema.  Lymphadenopathy:  He has no cervical adenopathy.  Neurological: He is oriented to person, place, and time and easily aroused. He appears lethargic.  HOH. Speech with moderate to severe dysarthria. Left field cut with inattention.Tends to keep left eye  closed--diplopia?--inconsistent tracking on EOM testing. Impaired attention with decreased insight and poor awareness of deficits.persistent ataxia LUE and decrease in fine motor control LLE. Right arm limited at shoulder due to RTC issue. Strength grossly 4/5 except for pain inhibition.  Skin: Skin is warm and dry.  Psychiatric:  Fair insight and awareness.  .    Assessment/Plan: 1. Functional deficits secondary to thrombotic left cerebellar infarct which require 3+ hours per day of interdisciplinary therapy in a comprehensive inpatient rehab setting. Physiatrist is providing close team supervision and 24 hour management of active medical problems listed below. Physiatrist and rehab team continue to assess barriers to discharge/monitor patient progress toward functional and medical goals. FIM:             FIM - Banker Devices: Arm rests Bed/Chair Transfer: 3: Bed > Chair or W/C: Mod A (lift or lower assist);3: Chair or W/C > Bed: Mod A (lift or lower assist)  FIM - Locomotion: Wheelchair Distance: 150 (increased time to complete) Locomotion: Wheelchair: 4: Travels 150 ft or more: maneuvers on rugs and over door sillls with minimal assistance (Pt.>75%) FIM - Locomotion: Ambulation Locomotion: Ambulation Assistive Devices: Designer, industrial/product Ambulation/Gait Assistance: 3: Mod assist Locomotion: Ambulation: 1: Travels less than 50 ft with moderate assistance (Pt: 50 - 74%)  Comprehension Comprehension Mode: Auditory Comprehension: 3-Understands basic 50 - 74% of the time/requires cueing 25 - 50%  of the time  Expression Expression Mode: Verbal Expression: 3-Expresses basic 50 - 74% of the time/requires cueing 25 - 50% of the time. Needs to repeat parts of sentences.  Social Interaction Social Interaction: 2-Interacts appropriately 25 - 49% of time - Needs frequent redirection.  Problem Solving Problem Solving: 3-Solves basic 50 - 74% of  the time/requires cueing 25 - 49% of the time  Memory Memory: 2-Recognizes or recalls 25 - 49% of the time/requires cueing 51 - 75% of the time Medical Problem List and Plan:  1. DVT Prophylaxis/Anticoagulation: Pharmaceutical: Lovenox  2. Pain Management: on Depakote 500 g for headaches. Will decrease dose to 250 mg and change timing to bedtime.  3. Mood: Seems to be distracted with impaired awareness. Will have LCSW follow up for evaluation.  4. Neuropsych: This patient Is not capable of making decisions on his/her own behalf.  5. HTN: Monitor with bid checks. Borderline---no changes at present 6. CAD/Chronic systolic CHF/ICM with ICD: Check daily weights.  . Will titrated diuretics as needed to home dose.  7. CKD Stage IV: RUE graft placed last fall. Was on lasix 80 mg tid with FR [phone] cc/day. Baseline weight at 185-190 Lbs. Baseline Cr<3.0. --follow weights and i's and o's 8. DM type 2: Hgb A1C @ 10. Monitor with AC/HS cbg checks. Po intake poor at this time.Continue lantus 10 units--was on 35 units at home.. Will continue SSI for elevated BS.   -fair control still.  9. Atrial Fibrillation with history of ventricular arrhthymias: monitor HR with bid checks. Continue coreg bid 10. Nausea: improved after bm's.   LOS (Days) 2 A FACE TO FACE EVALUATION WAS PERFORMED  Jason Walker T 02/16/2013 8:49 AM

## 2013-02-16 NOTE — Progress Notes (Signed)
Physical Therapy Session Note  Patient Details  Name: Nephtali Docken MRN: [id number] Date of Birth: 02/09/1930  Today's Date: 02/16/2013 Time: 7829-5621 Time Calculation (min): 45 min  Skilled Therapeutic Interventions/Progress Updates:  Patient in bed not dressed, therapy session started with training in supine to sit and EOB activities including dressing-min A for Upper body dressing and mod A for lower body. Sit to stand with mod A, poor standing balance during donning pants. EOB sitting -poor postural control, lean to the R and retrograde LOB. Transfer to w/c with mod A. Sitting edge of mat w/o support ,activities to facilitate trunk control and abdominal strength performed. Facilitation of crossing midline and reaching forward. Gait training 2 x 22 feet with RW with mod A, verbal and tactile cues for sequencing and toe clearance on R side.   Therapy Documentation Precautions:  Precautions Precautions: Fall Precaution Comments: decreased sitting and standing balance Restrictions Weight Bearing Restrictions: No Pain: Pain Assessment Pain Assessment: No/denies pain Pain Score: 0-No pain Faces Pain Scale: No hurt Locomotion : Ambulation Ambulation/Gait Assistance: 3: Mod assist    See FIM for current functional status  Therapy/Group: Individual Therapy  Dorna Mai 02/16/2013, 11:51 AM

## 2013-02-17 ENCOUNTER — Inpatient Hospital Stay (HOSPITAL_COMMUNITY): Payer: Medicare Other | Admitting: Physical Therapy

## 2013-02-17 ENCOUNTER — Inpatient Hospital Stay (HOSPITAL_COMMUNITY): Payer: Medicare Other | Admitting: Speech Pathology

## 2013-02-17 ENCOUNTER — Inpatient Hospital Stay (HOSPITAL_COMMUNITY): Payer: Medicare Other | Admitting: Occupational Therapy

## 2013-02-17 DIAGNOSIS — I6340 Cerebral infarction due to embolism of unspecified cerebral artery: Secondary | ICD-10-CM

## 2013-02-17 DIAGNOSIS — I69993 Ataxia following unspecified cerebrovascular disease: Secondary | ICD-10-CM

## 2013-02-17 LAB — GLUCOSE, CAPILLARY
Glucose-Capillary: 63 mg/dL — ABNORMAL LOW (ref 70–99)
Glucose-Capillary: 90 mg/dL (ref 70–99)
Glucose-Capillary: 132 mg/dL — ABNORMAL HIGH (ref 70–99)
Glucose-Capillary: 166 mg/dL — ABNORMAL HIGH (ref 70–99)

## 2013-02-17 LAB — CBC WITH DIFFERENTIAL/PLATELET
Basophils Absolute: 0 10*3/uL (ref 0.0–0.1)
Basophils Relative: 0 % (ref 0–1)
Eosinophils Absolute: 0.2 10*3/uL (ref 0.0–0.7)
Hemoglobin: 12.2 g/dL — ABNORMAL LOW (ref 13.0–17.0)
MCH: 29.3 pg (ref 26.0–34.0)
MCHC: 34.2 g/dL (ref 30.0–36.0)
Monocytes Relative: 12 % (ref 3–12)
Neutro Abs: 6.8 10*3/uL (ref 1.7–7.7)
Neutrophils Relative %: 73 % (ref 43–77)
Platelets: 115 10*3/uL — ABNORMAL LOW (ref 150–400)
RDW: 18.3 % — ABNORMAL HIGH (ref 11.5–15.5)

## 2013-02-17 LAB — COMPREHENSIVE METABOLIC PANEL
AST: 22 U/L (ref 0–37)
Albumin: 3.3 g/dL — ABNORMAL LOW (ref 3.5–5.2)
Alkaline Phosphatase: 55 U/L (ref 39–117)
BUN: 33 mg/dL — ABNORMAL HIGH (ref 6–23)
Chloride: 97 mEq/L (ref 96–112)
Potassium: 4 mEq/L (ref 3.5–5.1)
Total Bilirubin: 0.6 mg/dL (ref 0.3–1.2)
Total Protein: 7 g/dL (ref 6.0–8.3)

## 2013-02-17 NOTE — Progress Notes (Signed)
Physical Therapy Note  Patient Details  Name: Jason Walker MRN: [id number] Date of Birth: [birth year] Today's Date: 02/17/2013  4098-1191 (55 minutes) individual Pain: no complaint of pain Other: no complaint of dizziness, reports diplopia with far vs near vision Focus of treatment: Therapeutic exercises focused on bilateral extremity strengthening/coordination; gait training; wc mobility training Treatment: Pt up in wc; wc mobility 120 feet X 2 using bilateral UEs with assist for steering ( right bias with decreased scan to right); Nustep Level 4 X 6.5 minutes before c/o fatigue; tall kneeling on mat  X 2 with min/mod assist for balance/ min /moderate sway to right; gait 60 feet RW X 2 min/mod assist with cues for hand placement sit >< stand; up/down 2 steps X 2 with One rail (sideways) min assist.  1445-1515 (30 minutes) indivdual Pain: no complaint of pain Focus of treatment: gait training; therapeutic activities focused on standing allignment/weight shifts Treatment: gait 60 feet RW min/mod assist ; gait over obstacles /over curb min/mod assist with max vcs for technique; Kinetron in standing to facilitate erect standing with max posterior lean.   Corinn Stoltzfus,JIM 02/17/2013, 9:09 AM

## 2013-02-17 NOTE — Progress Notes (Signed)
Patient ID: Jason Walker, male   DOB: 04-11-1930, 79 y.o.   MRN: [id number] Subjective/Complaints: 79 y.o. male with history of DM, CAD, A-Fib but no anticoagulation due to life threating GIB, ICD; who was admitted to [facility] on 02/09/13 with N/V, fall and unresponsiveness. Initial CCT negative. [facility]. 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 Abdominal U/S done due to elevated amylase and CKD. This revealed 7 mm gallstone, no cholecystitis and atrophic left kidney. Carotid dopplers without ICA stenosis. 2D echo with EF 35% with diffuse hypokinesis and mild-moderate MVR. Neurology recommended continuing ASA/Plavix and close monitoring as at risk for hydrocephalus and increased ICP. Follow up CCT 03/18 with stable L-cerebellar infarct with extensive cytotoxic edema and mass effect  Slept well. No complaints today. Denies nausea.  ] A 12 point review of systems has been performed and if not noted above is otherwise negative.   Objective: Vital Signs: Blood pressure 126/60, pulse 70, temperature 97.5 F (36.4 C), temperature source Oral, resp. rate 16, height 5\' 5"  (1.651 m), weight 87 kg (191 lb 12.8 oz), SpO2 97.00%. No results found. No results found for this basename: WBC, HGB, HCT, PLT,  in the last 72 hours No results found for this basename: NA, K, CL, CO, GLUCOSE, BUN, CREATININE, CALCIUM,  in the last 72 hours CBG (last 3)   Recent Labs  02/16/13 2029 02/17/13 0730 02/17/13 0801  GLUCAP 108* 63* 90    Wt Readings from Last 3 Encounters:  02/17/13 87 kg (191 lb 12.8 oz)  02/14/13 85.684 kg (188 lb 14.4 oz)  02/04/13 87.091 kg (192 lb)    Physical Exam:  Constitutional: He is oriented to person, place, and time. He appears well-developed and well-nourished. He appears lethargic. He is easily aroused.  HENT:  Head: Normocephalic and atraumatic.  Left Ear:  External ear normal.  Eyes: Conjunctivae and EOM are normal. Pupils are equal, round, and reactive to light.  Neck: Normal range of motion. No JVD present. No tracheal deviation present. No thyromegaly present.  Cardiovascular: Normal rate. An irregularly irregular rhythm present. Exam reveals no gallop and no friction rub.  No murmur heard.  Pulmonary/Chest: Effort normal and breath sounds normal. No respiratory distress. He has no wheezes. He has no rales. He exhibits no tenderness.  Abdominal: bell soft, NT. Bowel sounds positive.  Musculoskeletal: He exhibits tenderness. He exhibits no edema.  Lymphadenopathy:  He has no cervical adenopathy.  Neurological: He is oriented to person, place, and time and easily aroused. He appears lethargic.  HOH. Speech with moderate to severe dysarthria. Left field cut with inattention.Tends to keep left eye closed--diplopia?--inconsistent tracking on EOM testing. Impaired attention with decreased insight and poor awareness of deficits.persistent ataxia LUE and decrease in fine motor control LLE. Right arm limited at shoulder due to RTC issue. Strength grossly 4/5 except for pain inhibition.  Skin: Skin is warm and dry.  Psychiatric:  Fair insight and awareness.  .    Assessment/Plan: 1. Functional deficits secondary to thrombotic left cerebellar infarct which require 3+ hours per day of interdisciplinary therapy in a comprehensive inpatient rehab setting. Physiatrist is providing close team supervision and 24 hour management of active medical problems listed below. Physiatrist and rehab team continue to assess barriers to discharge/monitor patient progress toward functional and medical goals. FIM: FIM - Bathing Bathing: 1: Two helpers  FIM - Upper Body Dressing/Undressing Upper  body dressing/undressing: 1: Two helpers FIM - Lower Body Dressing/Undressing Lower body dressing/undressing: 1: Two helpers  FIM - Hotel manager Devices:  Grab bar or rail for support Toileting: 1: Total-Patient completed zero steps, helper did all 3     FIM - Banker Devices: Therapist, occupational: 4: Supine > Sit: Min A (steadying Pt. > 75%/lift 1 leg);3: Bed > Chair or W/C: Mod A (lift or lower assist)  FIM - Locomotion: Wheelchair Distance: 150 (increased time to complete) Locomotion: Wheelchair: 0: Activity did not occur FIM - Locomotion: Ambulation Locomotion: Ambulation Assistive Devices: Designer, industrial/product Ambulation/Gait Assistance: 3: Mod assist Locomotion: Ambulation: 1: Travels less than 50 ft with moderate assistance (Pt: 50 - 74%)  Comprehension Comprehension Mode: Auditory Comprehension: 3-Understands basic 50 - 74% of the time/requires cueing 25 - 50%  of the time  Expression Expression Mode: Verbal Expression: 3-Expresses basic 50 - 74% of the time/requires cueing 25 - 50% of the time. Needs to repeat parts of sentences.  Social Interaction Social Interaction: 2-Interacts appropriately 25 - 49% of time - Needs frequent redirection.  Problem Solving Problem Solving: 3-Solves basic 50 - 74% of the time/requires cueing 25 - 49% of the time  Memory Memory: 2-Recognizes or recalls 25 - 49% of the time/requires cueing 51 - 75% of the time Medical Problem List and Plan:  1. DVT Prophylaxis/Anticoagulation: Pharmaceutical: Lovenox  2. Pain Management: on Depakote 500 g for headaches. Will decrease dose to 250 mg and change timing to bedtime.  3. Mood: Seems to be distracted with impaired awareness. Will have LCSW follow up for evaluation.  4. Neuropsych: This patient Is not capable of making decisions on his/her own behalf.  5. HTN: Monitor with bid checks. Borderline---no changes at present 6. CAD/Chronic systolic CHF/ICM with ICD: Check daily weights.  . Will titrated diuretics as needed to home dose.  7. CKD Stage IV: RUE graft placed last fall. Was on lasix 80 mg tid with FR  [id number] cc/day. Baseline weight at 185-190 Lbs. Baseline Cr<3.0. --follow weights and i's and o's 8. DM type 2: Hgb A1C @ 10. Monitor with AC/HS cbg checks. Po intake poor at this time.Continue lantus 10 units--was on 35 units at home.. Will continue SSI for elevated BS.   -fair control still.  9. Atrial Fibrillation with history of ventricular arrhthymias: monitor HR with bid checks. Continue coreg bid 10. Nausea: improved after bm's.   LOS (Days) 3 A FACE TO FACE EVALUATION WAS PERFORMED  Erick Colace 02/17/2013 8:17 AM

## 2013-02-17 NOTE — Progress Notes (Signed)
Speech Language Pathology Daily Session Note  Patient Details  Name: Jason Walker MRN: [id number] Date of Birth: 09-19-1930  Today's Date: 02/17/2013 Time: 1535-1600 Time Calculation (min): 25 min  Short Term Goals: Week 1: SLP Short Term Goal 1 (Week 1): Pt will demonstrate intellectual awareness by verbalizing two cog-ling deficits with moderate question cues.  SLP Short Term Goal 2 (Week 1): Pt will increase verbal output to multiple sentence level during communication with moderate multimodal cues and 75% accuracy.   SLP Short Term Goal 3 (Week 1): Pt will increase phonatory amplitude to accomodate across his hospital room communication without listener straining and minimal verba/visual cues with 75% of opportunities.   SLP Short Term Goal 4 (Week 1): Pt will increase speech comprehensibility to 75% at phrase/sentence level with moderate multimodal cues.   SLP Short Term Goal 5 (Week 1): Pt will demonstrate sustained attention to functional basic task for 7 minutes with min question cues.    Skilled Therapeutic Interventions: Skilled treatment session focused on addressing speech intelligibly.  SLP facilitated session with get to know you discussion and mod faded to min assist verbal cues to slow rate of speech during sentence level verbal expression.  Patient denies any difficulty; however, wife confirms change in speech.     FIM:  Comprehension Comprehension Mode: Auditory Comprehension: 5-Understands basic 90% of the time/requires cueing < 10% of the time Expression Expression Mode: Verbal Expression: 3-Expresses basic 50 - 74% of the time/requires cueing 25 - 50% of the time. Needs to repeat parts of sentences. Social Interaction Social Interaction: 2-Interacts appropriately 25 - 49% of time - Needs frequent redirection. Problem Solving Problem Solving: 3-Solves basic 50 - 74% of the time/requires cueing 25 - 49% of the time Memory Memory: 2-Recognizes or recalls 25 - 49% of the  time/requires cueing 51 - 75% of the time FIM - Eating Eating Activity: 5: Set-up assist for open containers  Pain Pain Assessment Pain Assessment: No/denies pain  Therapy/Group: Individual Therapy  Charlane Ferretti., CCC-SLP [phone]  Jason Walker 02/17/2013, 4:20 PM

## 2013-02-17 NOTE — Progress Notes (Signed)
Patient information reviewed and entered into eRehab system by Orva Riles, RN, CRRN, PPS Coordinator.  Information including medical coding and functional independence measure will be reviewed and updated through discharge.     Per nursing patient/family was given "Data Collection Information Summary for Patients in Inpatient Rehabilitation Facilities with attached "Privacy Act Statement-Health Care Records" upon admission.  

## 2013-02-17 NOTE — Progress Notes (Signed)
Hypoglycemic Event  CBG: 62  Treatment: 15 GM carbohydrate snack  Symptoms: None  Follow-up CBG: Time:0850 CBG Result 90  Possible Reasons for Event: Unknown  Comments/MD notified:Dan Anguilli PA    AYERS, Mary-Ann Pennella D  Remember to initiate Hypoglycemia Order Set & complete

## 2013-02-17 NOTE — Care Management Note (Signed)
[facility]patient Rehabilitation Center Individual Statement of Services  Patient Name:  Jason Walker  Date:  02/17/2013  Welcome to the Inpatient Rehabilitation Center.  Our goal is to provide you with an individualized program based on your diagnosis and situation, designed to meet your specific needs.  With this comprehensive rehabilitation program, you will be expected to participate in at least 3 hours of rehabilitation therapies Monday-Friday, with modified therapy programming on the weekends.  Your rehabilitation program will include the following services:  Physical Therapy (PT), Occupational Therapy (OT), Speech Therapy (ST), 24 hour per day rehabilitation nursing, Case Management ( Social Worker), Rehabilitation Medicine, Nutrition Services and Pharmacy Services  Weekly team conferences will be held on Wednesday to discuss your progress.  Your  Social Worker will talk with you frequently to get your input and to update you on team discussions.  Team conferences with you and your family in attendance may also be held.  Expected length of stay: 2.5-3 weeks  Overall anticipated outcome: Supervision level  Depending on your progress and recovery, your program may change.  Your  Social Worker will coordinate services and will keep you informed of any changes.  Your  SW name and contact number are listed  below.  The following services may also be recommended but are not provided by the Inpatient Rehabilitation Center:   Driving Evaluations  Home Health Rehabiltiation Services  Outpatient Rehabilitatation Servives    Arrangements will be made to provide these services after discharge if needed.  Arrangements include referral to agencies that provide these services.  Your insurance has been verified to be:  [employer] Your primary doctor is:  Dr. Lucille Passy  Pertinent information will be shared with your doctor and your insurance company.   Social Worker:  Dossie Der, [place]  [phone]  Information discussed with and copy given to patient by: Lucy Chris, 02/17/2013, 10:10 AM

## 2013-02-17 NOTE — Progress Notes (Signed)
Social Work Assessment and Plan Social Work Assessment and Plan  Patient Details  Name: Jason Walker MRN: [id number] Date of Birth: 11/30/1929  Today's Date: 02/17/2013  Problem List:  Patient Active Problem List  Diagnosis  . DM  . AMI  . CAD  . RBBB  . VENTRICULAR TACHYCARDIA, PAROXYSMAL  . Chronic systolic heart failure  . AAA  . AUTOMATIC IMPLANTABLE CARDIAC DEFIBRILLATOR SITU  . Atrial fibrillation  . CVA (cerebral infarction)  . DM type 1 (diabetes mellitus, type 1)  . CKD (chronic kidney disease) stage 4, GFR 15-29 ml/min   Past Medical History:  Past Medical History  Diagnosis Date  . RBBB (right bundle branch block)   . AAA (abdominal aortic aneurysm)   . AMI (acute myocardial infarction)   . Diabetes mellitus   . Coronary artery disease   . Pacemaker   . Defibrillator activation   . Hypertension   . Heart disease   . Heart attack   . Wears dentures   . Hyperlipidemia   . Shortness of breath   . Kidney disease   . Hyperpotassemia   . Edema     BLE  . Systolic CHF    Past Surgical History:  Past Surgical History  Procedure Laterality Date  . Coronary artery bypass graft  1990  . Icd  2011  . Hernia repair      umbilical and RIH  . Aneurysm coiling    . Rotator cuff repair      bilateral  . Lasik      with lens implant.    Social History:  reports that he quit smoking about 25 years ago. His smoking use included Cigarettes. He smoked 0.00 packs per day for 30 years. He has never used smokeless tobacco. He reports that  drinks alcohol. He reports that he does not use illicit drugs.  Family / Support Systems Marital Status: Married Patient Roles: Spouse;Parent;Other (Comment) (retiree) Spouse/Significant Other: Jason Walker  [phone] Children: Children on both sides-he has three and she has four Other Supports: Friends Anticipated Caregiver: Wife Ability/Limitations of Caregiver: Wife currently works 32 hours a week, but  looking at different options Caregiver Availability: Other (Comment) (Coming up with a plan for discharge) Family Dynamics: Close knit family who are supportive of one another and are there for one another.  Wife reports they will help with what they can for husband.  Social History Preferred language: English Religion:  Cultural Background: No issues Education: Did not finish high school-limited reading Read: No Write: No Employment Status: Retired Fish farm manager Issues: No issues Guardian/Conservator: None-according to MD pt is not capable of making his own decisions.  Will therefore rely upon wife to make his decisions while here.   Abuse/Neglect Physical Abuse: Denies Verbal Abuse: Denies Sexual Abuse: Denies Exploitation of patient/patient's resources: Denies Self-Neglect: Denies  Emotional Status Pt's affect, behavior adn adjustment status: Pt is motivated and making good progress, both he and wife pleased with how much progress he has made since this weekend.  he has always been independent and wants to remain so.   Recent Psychosocial Issues: other medical issues Pyschiatric History: No history-deferred depression screen due to pt tired from therapies and wanted to rest.  Will try back later and monitor his coping while here. Substance Abuse History: No issues  Patient / Family Perceptions, Expectations & Goals Pt/Family understanding of illness & functional limitations: Pt and wife can explain his stroke and deficits.  Both are pleased with  the progress he has made thus far and are hopeful he will do well here.  Wife here today to observe in therapies and for goal setting. Premorbid pt/family roles/activities: Husband, Retiree, father, grandfather, [employer] member, Psychiatrist, Catering manager Anticipated changes in roles/activities/participation: resume Pt/family expectations/goals: Pt states: ' I want to for myself before I leave here."  Wife reports: " I am hopeful he will  do well here, he has already made good progress."  [employer]: None Premorbid Home Care/DME Agencies: None Transportation available at discharge: [employer] referrals recommended: Support group (specify) ([facility] Support group)  Discharge Planning Living Arrangements: Spouse/significant other Support Systems: Spouse/significant other;Children;Other relatives;Friends/neighbors Type of Residence: Private residence Insurance Resources: Media planner (specify) Passenger transport manager) Surveyor, quantity Resources: Tree surgeon;Family Support Financial Screen Referred: No Living Expenses: Lives with family Money Management: Patient;Spouse Do you have any problems obtaining your medications?: No Home Management: Wife Patient/Family Preliminary Plans: Return home with wife providing most of the assist, children will help when they can.  Still working on a discharge plan due to wife works 32 hours a week and if pt requires 24 hour care will need to come up with a plan for this. Social Work Anticipated Follow Up Needs: HH/OP;Support Group  Clinical Impression Pleasant couple who are supportive of one another.  Wife is here to observe in therapies today and see how he is doing.  Aware pt may require 24 hour care at discharge, coming up with  A discharge plan.  Pt very motivated to do well here and get as independent as possible.     Lucy Chris 02/17/2013, 10:27 AM

## 2013-02-17 NOTE — Progress Notes (Signed)
Occupational Therapy Session Note  Patient Details  Name: Jason Walker MRN: [id number] Date of Birth: 03-13-1930  Today's Date: 02/17/2013 Time: 0900-0955 and 1100-1130 Time Calculation (min): 55 min and 30 min  Short Term Goals: Week 1:  OT Short Term Goal 1 (Week 1): Pt will transfer to [facility] to with min A OT Short Term Goal 2 (Week 1): Pt will don LB clothing with min A OT Short Term Goal 3 (Week 1): Pt will perform sit to stand wtih steady A for clothing management OT Short Term Goal 4 (Week 1): Pt will perform bed mobility wtih rail with supervision  Skilled Therapeutic Interventions/Progress Updates:    Visit 1:  No c/o pain. Pt seen for BADL retraining of toileting, bathing, and dressing with a focus on use of RUE, standing balance and postural control. Pt is HOH and needs to hear directions slowly.  Overall, his performance was excellent today. He was able to complete toilet and shower transfers with min-mod assist, donned clothing with steady assist, assist with right shoe and tying shoe only (was able to tie left).  He is using his weaker RUE as a dominant assist.  In the shower he did lean to right in standing but responded to cues to correct posture. Wife present for the session and stayed with him until next therapist arrived.  Visit 2: No c/o pain.  Pt seen for RUE neuro re-ed, trunk control in unsupported sitting and dynamic standing balance with standing at table and picking up small pegs and polishing the table with right hand.  No leaning to the right in standing but he was leaning forward through his hips.  Did correct posture with cues.  In sitting, used 1 lb. Dowel for wrist extension, shoulder flexion, and trunk rotation exercises. He was very tired throughout the session.  Pt taken back to room for lunch. Quick release belt applied and call light within reach. Pt's wife with him.  Therapy Documentation Precautions:  Precautions Precautions: Fall Precaution Comments:  decreased sitting and standing balance Restrictions Weight Bearing Restrictions: No   Pain: Pain Assessment Pain Assessment: No/denies pain ADL:  See FIM for current functional status  Therapy/Group: Individual Therapy  Donnica Jarnagin 02/17/2013, 9:30 AM

## 2013-02-17 NOTE — Telephone Encounter (Signed)
LMOM in regards to transmissions. Pt has appt with [facility] on 03-17-13. Left message on machine to call and reschedule if need to due to pt being in rehab.

## 2013-02-18 ENCOUNTER — Inpatient Hospital Stay (HOSPITAL_COMMUNITY): Payer: Medicare Other | Admitting: Occupational Therapy

## 2013-02-18 ENCOUNTER — Inpatient Hospital Stay (HOSPITAL_COMMUNITY): Payer: Medicare Other | Admitting: *Deleted

## 2013-02-18 ENCOUNTER — Inpatient Hospital Stay (HOSPITAL_COMMUNITY): Payer: Medicare Other | Admitting: Speech Pathology

## 2013-02-18 ENCOUNTER — Encounter (HOSPITAL_COMMUNITY): Payer: Medicare Other | Admitting: Occupational Therapy

## 2013-02-18 LAB — GLUCOSE, CAPILLARY
Glucose-Capillary: 95 mg/dL (ref 70–99)
Glucose-Capillary: 132 mg/dL — ABNORMAL HIGH (ref 70–99)
Glucose-Capillary: 222 mg/dL — ABNORMAL HIGH (ref 70–99)
Glucose-Capillary: 237 mg/dL — ABNORMAL HIGH (ref 70–99)

## 2013-02-18 MED ORDER — INSULIN ASPART 100 UNIT/ML ~~LOC~~ SOLN: 3.0000 [IU] | Freq: Two times a day (BID) | SUBCUTANEOUS | Status: DC

## 2013-02-18 MED ORDER — MAGIC MOUTHWASH W/LIDOCAINE
10.0000 mL | Freq: Four times a day (QID) | ORAL | Status: DC
  Administered 2013-02-18 – 2013-02-24 (×23): 10 mL via ORAL
  Filled 2013-02-18 (×31): qty 10

## 2013-02-18 MED ORDER — INSULIN ASPART 100 UNIT/ML ~~LOC~~ SOLN
3.0000 [IU] | Freq: Two times a day (BID) | SUBCUTANEOUS | Status: DC
  Administered 2013-02-18 – 2013-02-25 (×14): 3 [IU] via SUBCUTANEOUS

## 2013-02-18 MED ORDER — INSULIN ASPART 100 UNIT/ML ~~LOC~~ SOLN
2.0000 [IU] | Freq: Three times a day (TID) | SUBCUTANEOUS | Status: DC
  Administered 2013-02-18: 3 [IU] via SUBCUTANEOUS
  Administered 2013-02-19: 2 [IU] via SUBCUTANEOUS
  Administered 2013-02-19 – 2013-02-20 (×2): 3 [IU] via SUBCUTANEOUS
  Administered 2013-02-20: 5 [IU] via SUBCUTANEOUS
  Administered 2013-02-21: 3 [IU] via SUBCUTANEOUS
  Administered 2013-02-22: 2 [IU] via SUBCUTANEOUS
  Administered 2013-02-24: 3 [IU] via SUBCUTANEOUS

## 2013-02-18 MED ORDER — FUROSEMIDE 40 MG PO TABS
40.0000 mg | ORAL_TABLET | Freq: Two times a day (BID) | ORAL | Status: DC
  Administered 2013-02-18 – 2013-02-19 (×3): 40 mg via ORAL
  Filled 2013-02-18 (×6): qty 1

## 2013-02-18 NOTE — Progress Notes (Signed)
Speech Language Pathology Daily Session Note  Patient Details  Name: Jason Walker MRN: [id number] Date of Birth: 03-27-1930  Today's Date: 02/18/2013 Time: 1130-1200 Time Calculation (min): 30 min  Short Term Goals: Week 1: SLP Short Term Goal 1 (Week 1): Pt will demonstrate intellectual awareness by verbalizing two cog-ling deficits with moderate question cues.  SLP Short Term Goal 2 (Week 1): Pt will increase verbal output to multiple sentence level during communication with moderate multimodal cues and 75% accuracy.   SLP Short Term Goal 3 (Week 1): Pt will increase phonatory amplitude to accomodate across his hospital room communication without listener straining and minimal verba/visual cues with 75% of opportunities.   SLP Short Term Goal 4 (Week 1): Pt will increase speech comprehensibility to 75% at phrase/sentence level with moderate multimodal cues.   SLP Short Term Goal 5 (Week 1): Pt will demonstrate sustained attention to functional basic task for 7 minutes with min question cues.    Skilled Therapeutic Interventions: Skilled treatment session focused on addressing speech intelligibly in sturctured conversational activities.  SLP facilitated session 4 step picture sequencing cards and patient required min assist question cues to accurately sequence and supervision level vebral cues for intelligibility.  Clinician cues were to faciliate carryover of slow rate of speech during sentence level verbal expression.     FIM:  Comprehension Comprehension Mode: Auditory Comprehension: 5-Understands basic 90% of the time/requires cueing < 10% of the time Expression Expression Mode: Verbal Expression: 3-Expresses basic 50 - 74% of the time/requires cueing 25 - 50% of the time. Needs to repeat parts of sentences. Social Interaction Social Interaction: 2-Interacts appropriately 25 - 49% of time - Needs frequent redirection. Problem Solving Problem Solving: 3-Solves basic 50 - 74% of the  time/requires cueing 25 - 49% of the time Memory Memory: 2-Recognizes or recalls 25 - 49% of the time/requires cueing 51 - 75% of the time  Pain Pain Assessment Pain Assessment: No/denies pain Pain Score: 0-No pain  Therapy/Group: Individual Therapy  Charlane Ferretti., CCC-SLP [phone]  Azelia Reiger 02/18/2013, 4:55 PM

## 2013-02-18 NOTE — Progress Notes (Signed)
Physical Therapy Session Note  Patient Details  Name: Jason Walker MRN: [id number] Date of Birth: Nov 06, 1930  Today's Date: 02/18/2013 Time: 1515-1600 Time Calculation (min): 45 min  Short Term Goals: Week 1:  PT Short Term Goal 1 (Week 1): Patient will perform bed mobility with min assist. PT Short Term Goal 2 (Week 1): Patient will perform stand pivot transfers with LRAD and min assist. PT Short Term Goal 3 (Week 1): Patient will ambulate 28' with LRAD and min assist. PT Short Term Goal 4 (Week 1): Patient will negotiate 5 steps with B handrails and min assist.  Skilled Therapeutic Interventions/Progress Updates:    Patient received supine in bed. Today's session focused on wheelchair mobility (with B LE for strengthening/coordination), gait training, standing dynamic balance, and NMR of core/trunk musculature, see details below.  Patient returned to room and left seated in wheelchair with seatbelt donned and all needs within reach with wife present.  Therapy Documentation Precautions:  Precautions Precautions: Fall Precaution Comments: decreased sitting and standing balance Restrictions Weight Bearing Restrictions: No Pain: Pain Assessment Pain Assessment: No/denies pain Pain Score: 0-No pain Locomotion : Ambulation Ambulation: Yes Ambulation/Gait Assistance: 4: Min assist;3: Mod assist Ambulation Distance (Feet): 174 Feet Assistive device: Rolling walker Ambulation/Gait Assistance Details: Tactile cues for posture;Tactile cues for initiation;Manual facilitation for weight shifting;Verbal cues for gait pattern;Verbal cues for precautions/safety Ambulation/Gait Assistance Details: Patient instructed in gait training 174' x1 with rolling walker and min-mod A and 35' x2 with L HHA and mod A. Patient requires manual weight shift to the L. Gait Gait: Yes Gait Pattern: Decreased stride length;Shuffle;Trunk flexed;Decreased weight shift to left;Lateral trunk lean to right;Decreased  stance time - left;Step-through pattern Stairs / Additional Locomotion Stairs: Yes Stairs Assistance: 4: Min assist Stairs Assistance Details: Tactile cues for posture;Manual facilitation for weight shifting;Verbal cues for sequencing;Verbal cues for technique;Verbal cues for precautions/safety;Visual cues/gestures for sequencing Stair Management Technique: Two rails;Step to pattern;Forwards Number of Stairs: 5 Height of Stairs: 6 Wheelchair Mobility Wheelchair Mobility: Yes Wheelchair Assistance: 4: Administrator, sports Details: Verbal cues for precautions/safety;Verbal cues for sequencing;Verbal cues for Diplomatic Services operational officer: Both lower extermities Wheelchair Parts Management: Needs assistance Distance: 150  Balance: Balance Balance Assessed: Yes Dynamic Standing Balance Dynamic Standing - Balance Support: No upper extremity supported;During functional activity Dynamic Standing - Level of Assistance: 4: Min assist;3: Mod assist Dynamic Standing - Balance Activities: Lateral lean/weight shifting;Reaching for objects;Reaching for weighted objects;Reaching across midline Dynamic Standing - Comments: Patient performed 2 rounds of reaching for 5 horseshoes placed on elevated basketball rim on patient's L to facilitate increased weight shift to the L and increased weight bearing through L LE. Patient instructed to place horseshoe in R hand and reach up and to his R to give horseshoe to other therapist to promote increased lateral trunk lengthening on R side. Patient requires min-mod assist throughout activity. Other Treatments: Weight Bearing Technique Weight Bearing Technique: Yes RUE Weight Bearing Technique: Prone;Quadruped LUE Weight Bearing Technique: Prone;Quadruped Response to Weight Bearing Technique: Patient performed 2 rounds of prone on elbows x30-45" each then transitioning to quadruped x90" each round. Patient able to transition from supine>prone>prone on  elbows>quadruped with supervision/verbal cues.  See FIM for current functional status  Therapy/Group: Individual Therapy  Chipper Herb. Kevonta Phariss, PT, DPT  02/18/2013, 5:02 PM

## 2013-02-18 NOTE — Progress Notes (Signed)
Patient ID: Jason Walker, male   DOB: [birth year], 79 y.o.   MRN: [id number] Subjective/Complaints: 79 y.o. male with history of DM, CAD, A-Fib but no anticoagulation due to life threating GIB, ICD; who was admitted to [facility] on 02/09/13 with N/V, fall and unresponsiveness. Initial CCT negative. [facility]. 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 Abdominal U/S done due to elevated amylase and CKD. This revealed 7 mm gallstone, no cholecystitis and atrophic left kidney. Carotid dopplers without ICA stenosis. 2D echo with EF 35% with diffuse hypokinesis and mild-moderate MVR. Neurology recommended continuing ASA/Plavix and close monitoring as at risk for hydrocephalus and increased ICP. Follow up CCT 03/18 with stable L-cerebellar infarct with extensive cytotoxic edema and mass effect  Slept well. No complaints today. Denies nausea.  ] A 12 point review of systems has been performed and if not noted above is otherwise negative.   Objective: Vital Signs: Blood pressure 121/67, pulse 68, temperature 97.6 F (36.4 C), temperature source Oral, resp. rate 20, height 5\' 5"  (1.651 m), weight 86.4 kg (190 lb 7.6 oz), SpO2 95.00%. No results found.  Recent Labs  02/17/13 0740  WBC 9.3  HGB 12.2*  HCT 35.7*  PLT 115*    Recent Labs  02/17/13 0740  NA 136  K 4.0  CL 97  GLUCOSE 66*  BUN 33*  CREATININE 1.85*  CALCIUM 9.1   CBG (last 3)   Recent Labs  02/17/13 1631 02/17/13 2204 02/18/13 0729  GLUCAP 166* 147* 95    Wt Readings from Last 3 Encounters:  02/18/13 86.4 kg (190 lb 7.6 oz)  02/14/13 85.684 kg (188 lb 14.4 oz)  02/04/13 87.091 kg (192 lb)    Physical Exam:  Constitutional: He is oriented to person, place, and time. He appears well-developed and well-nourished. He appears lethargic. He is easily aroused.  HENT:  Head: Normocephalic and atraumatic.   Left Ear: External ear normal.  Eyes: Conjunctivae and EOM are normal. Pupils are equal, round, and reactive to light.  Neck: Normal range of motion. No JVD present. No tracheal deviation present. No thyromegaly present.  Cardiovascular: Normal rate. An irregularly irregular rhythm present. Exam reveals no gallop and no friction rub.  No murmur heard.  Pulmonary/Chest: Effort normal and breath sounds normal. No respiratory distress. He has no wheezes. He has no rales. He exhibits no tenderness.  Abdominal: bell soft, NT. Bowel sounds positive.  Musculoskeletal: He exhibits tenderness. He exhibits no edema.  Lymphadenopathy:  He has no cervical adenopathy.  Neurological: He is oriented to person, place, and time and easily aroused. He appears lethargic.  HOH. Speech with moderate to severe dysarthria. Left field cut with inattention.Tends to keep left eye closed--diplopia?--inconsistent tracking on EOM testing. Impaired attention with decreased insight and poor awareness of deficits.persistent ataxia LUE and decrease in fine motor control LLE. Right arm limited at shoulder due to RTC issue. Strength grossly 4/5 except for pain inhibition.  Skin: Skin is warm and dry.  Psychiatric:  Fair insight and awareness.  .    Assessment/Plan: 1. Functional deficits secondary to thrombotic left cerebellar infarct which require 3+ hours per day of interdisciplinary therapy in a comprehensive inpatient rehab setting. Physiatrist is providing close team supervision and 24 hour management of active medical problems listed below. Physiatrist and rehab team continue to assess barriers to discharge/monitor patient progress toward functional and medical goals. FIM: FIM -  Bathing Bathing Steps Patient Completed: Chest;Right Arm;Left Arm;Abdomen;Front perineal area;Buttocks;Right upper leg;Left upper leg;Right lower leg (including foot);Left lower leg (including foot) Bathing: 4: Steadying assist  FIM - Upper  Body Dressing/Undressing Upper body dressing/undressing steps patient completed: Put head through opening of pull over shirt/dress;Thread/unthread left sleeve of pullover shirt/dress;Thread/unthread right sleeve of pullover shirt/dresss;Pull shirt over trunk Upper body dressing/undressing: 5: Supervision: Safety issues/verbal cues FIM - Lower Body Dressing/Undressing Lower body dressing/undressing steps patient completed: Thread/unthread right pants leg;Thread/unthread left pants leg;Pull pants up/down;Don/Doff right sock;Don/Doff left sock;Fasten/unfasten left shoe;Don/Doff left shoe Lower body dressing/undressing: 4: Min-Patient completed 75 plus % of tasks  FIM - Toileting Toileting steps completed by patient: Performs perineal hygiene Toileting Assistive Devices: Grab bar or rail for support Toileting: 2: Max-Patient completed 1 of 3 steps  FIM - Diplomatic Services operational officer Devices: Grab bars Toilet Transfers: 3-To toilet/BSC: Mod A (lift or lower assist);3-From toilet/BSC: Mod A (lift or lower assist)  FIM - Bed/Chair Transfer Bed/Chair Transfer Assistive Devices: Therapist, occupational: 4: Supine > Sit: Min A (steadying Pt. > 75%/lift 1 leg);3: Bed > Chair or W/C: Mod A (lift or lower assist)  FIM - Locomotion: Wheelchair Distance: 120 Locomotion: Wheelchair: 2: Travels 50 - 149 ft with moderate assistance (Pt: 50 - 74%) FIM - Locomotion: Ambulation Locomotion: Ambulation Assistive Devices: Designer, industrial/product Ambulation/Gait Assistance: 3: Mod assist Locomotion: Ambulation: 2: Travels 50 - 149 ft with moderate assistance (Pt: 50 - 74%)  Comprehension Comprehension Mode: Auditory Comprehension: 5-Understands basic 90% of the time/requires cueing < 10% of the time  Expression Expression Mode: Verbal Expression: 3-Expresses basic 50 - 74% of the time/requires cueing 25 - 50% of the time. Needs to repeat parts of sentences.  Social Interaction Social  Interaction: 2-Interacts appropriately 25 - 49% of time - Needs frequent redirection.  Problem Solving Problem Solving: 3-Solves basic 50 - 74% of the time/requires cueing 25 - 49% of the time  Memory Memory: 2-Recognizes or recalls 25 - 49% of the time/requires cueing 51 - 75% of the time Medical Problem List and Plan:  1. DVT Prophylaxis/Anticoagulation: Pharmaceutical: Lovenox, plt low, cont to monitor, no sign of bleeding 2. Pain Management: on Depakote 500 g for headaches. Will decrease dose to 250 mg and change timing to bedtime.  3. Mood: Seems to be distracted with impaired awareness. Will have LCSW follow up for evaluation.  4. Neuropsych: This patient Is not capable of making decisions on his/her own behalf.  5. HTN: Monitor with bid checks. Borderline---no changes at present 6. CAD/Chronic systolic CHF/ICM with ICD: Check daily weights.  . Will titrated diuretics as needed to home dose.  7. CKD Stage IV: RUE graft placed last fall. Was on lasix 80 mg tid with FR [phone] cc/day. Baseline weight at 185-190 Lbs. Baseline Cr<3.0. --follow weights and i's and o's 8. DM type 2: Hgb A1C @ 10. Monitor with AC/HS cbg checks. Po intake poor at this time.Continue lantus 10 units--was on 35 units at home.. Will continue SSI for elevated BS.   -fair control still.  9. Atrial Fibrillation with history of ventricular arrhthymias: monitor HR with bid checks. Continue coreg bid 10. Nausea: improved after bm's.   LOS (Days) 4 A FACE TO FACE EVALUATION WAS PERFORMED  Erick Colace 02/18/2013 7:40 AM

## 2013-02-18 NOTE — Progress Notes (Signed)
Condom cath placed on patient tonight per his request

## 2013-02-18 NOTE — Progress Notes (Signed)
Occupational Therapy Session Note  Patient Details  Name: Jason Walker MRN: [id number] Date of Birth: 09-07-1930  Today's Date: 02/18/2013 Time: 3086-5784 Time Calculation (min): 30 min  Short Term Goals: Week 1:  OT Short Term Goal 1 (Week 1): Pt will transfer to [facility] to with min A OT Short Term Goal 2 (Week 1): Pt will don LB clothing with min A OT Short Term Goal 3 (Week 1): Pt will perform sit to stand wtih steady A for clothing management OT Short Term Goal 4 (Week 1): Pt will perform bed mobility wtih rail with supervision  Skilled Therapeutic Interventions/Progress Updates:  Bed mobility with HOB flat and no bed rail, ambulate to bathroom for toilet transfer and toileting, stand at sink to wash hands, patient and wife practiced use of urinal in standing.  Focused session on dynamic balance, activity tolerance, wife is independent assisting patient to use the urinal in a standing position either EOB or from w/c due to urgency issues.  RN notified and notation placed on Safety Plan.  Therapy Documentation Precautions:  Precautions Precautions: Fall Precaution Comments: decreased sitting and standing balance Restrictions Weight Bearing Restrictions: No Pain: Denies pain See FIM for current functional status  Therapy/Group: Individual Therapy  Verona Hartshorn 02/18/2013, 3:56 PM

## 2013-02-18 NOTE — Progress Notes (Signed)
Occupational Therapy Session Note  Patient Details  Name: Jason Walker MRN: [id number] Date of Birth: 1930/02/24  Today's Date: 02/18/2013 Time: 0805-0900 and 4098-1191 Time Calculation (min): 55 min and 45 min  Short Term Goals: Week 1:  OT Short Term Goal 1 (Week 1): Pt will transfer to [facility] to with min A OT Short Term Goal 2 (Week 1): Pt will don LB clothing with min A OT Short Term Goal 3 (Week 1): Pt will perform sit to stand wtih steady A for clothing management OT Short Term Goal 4 (Week 1): Pt will perform bed mobility wtih rail with supervision  Skilled Therapeutic Interventions/Progress Updates:    Visit 1:  No c/o pain. Pt seen for BADL retraining of toileting, bathing, and dressing with a focus on RUE functional use and standing balance. Pt ambulated to toilet with RW with steady assist and used toilet with close supervision. He was able to don all of his clothing including tying his shoes with supervision. Pt needs mod cues to push up with hands versus pulling on the walker with sit to stand and to reaching back to sit down. Pt stood with RW with supervision for static standing, he needed mod assist for trunk support with alternating lifting knees. Lifting knees was difficult for him.  Min assist to stand with alternating arm lifts.  Pt needed several rest breaks as he was feeling fatigued during dressing. Quick release belt applied and call light within reach.  Visit 2: No c/o pain.  Pt's wife present and assisted with therapy session by pushing w/c behind patient as he ambulated with RW with min assist.  Pt seen this session to address trunk control, activity tolerance, and RUE functional use.  Pt is only able to ambulate 15-20 feet at a time before he becomes fatigued and needs to sit.  He continues to need mod cues with safe sit to stand technique.  In therapy gym patient worked on standing and completing 2 different PVC pipe tree designs. Min cues for finding exact sizes of pieces.  He used his RUE as a dominant assist.   In standing, he leans forward through hips.  Pt then worked on sitting balance with maintaining trunk control with BUE of lifting large ball, dowel exercises. He tends to lean back when trying to lift arms. Pt provided with theraputty for right grip/finger strength.   Therapy Documentation Precautions:  Precautions Precautions: Fall Precaution Comments: decreased sitting and standing balance Restrictions Weight Bearing Restrictions: No  Pain: Pain Assessment Pain Assessment: No/denies pain ADL:  See FIM for current functional status  Therapy/Group: Individual Therapy  SAGUIER,JULIA 02/18/2013, 9:30 AM

## 2013-02-19 ENCOUNTER — Inpatient Hospital Stay (HOSPITAL_COMMUNITY): Payer: Medicare Other | Admitting: Speech Pathology

## 2013-02-19 ENCOUNTER — Inpatient Hospital Stay (HOSPITAL_COMMUNITY): Payer: Medicare Other | Admitting: Occupational Therapy

## 2013-02-19 ENCOUNTER — Inpatient Hospital Stay (HOSPITAL_COMMUNITY): Payer: Medicare Other | Admitting: Physical Therapy

## 2013-02-19 LAB — GLUCOSE, CAPILLARY
Glucose-Capillary: 158 mg/dL — ABNORMAL HIGH (ref 70–99)
Glucose-Capillary: 239 mg/dL — ABNORMAL HIGH (ref 70–99)

## 2013-02-19 MED ORDER — FUROSEMIDE 40 MG PO TABS
40.0000 mg | ORAL_TABLET | Freq: Two times a day (BID) | ORAL | Status: DC
  Administered 2013-02-19 – 2013-02-20 (×3): 40 mg via ORAL
  Filled 2013-02-19 (×3): qty 1

## 2013-02-19 MED ORDER — INSULIN GLARGINE 100 UNIT/ML ~~LOC~~ SOLN
25.0000 [IU] | Freq: Every day | SUBCUTANEOUS | Status: DC
  Administered 2013-02-19: 25 [IU] via SUBCUTANEOUS
  Filled 2013-02-19 (×2): qty 0.25

## 2013-02-19 NOTE — Progress Notes (Signed)
Occupational Therapy Session Note  Patient Details  Name: Jason Walker MRN: [id number] Date of Birth: 1930/10/18  Today's Date: 02/19/2013 Time: 0805-0900 and  1100-1140 Time Calculation (min): 55 min and 40 min  Short Term Goals: Week 1:  OT Short Term Goal 1 (Week 1): Pt will transfer to [facility] to with min A OT Short Term Goal 2 (Week 1): Pt will don LB clothing with min A OT Short Term Goal 3 (Week 1): Pt will perform sit to stand wtih steady A for clothing management OT Short Term Goal 4 (Week 1): Pt will perform bed mobility wtih rail with supervision  Skilled Therapeutic Interventions/Progress Updates:    Visit 1:   No c/o pain.  Pt seen for BADL retraining of toileting, bathing, and dressing with a focus on dynamic balance, trunk control, use of RUE. Pt was able to ambulated to bathroom and in/out of shower with RW with steady assist.  Pt was able to complete self care with supervision using shoe horn to don shoes. He did very well by pushing up with both hands to come into a stand without cues.  Pt's daughter came in at end of session and observed her dad standing at sink to complete grooming. Discussed the need for him to have 24 hr supervision due to safety concerns and decreased standing balance.  When pt was finished grooming he started to sit down with no chair behind him.  Pt stated that he thought the chair was behind him. This was a good example for the daughter to see as to why he needs supervision.  Pt ambulated over to w/c and sat down by reaching back with one hand.  The physical therapist came in at the end of the session.  Visit 2: No c/o pain.  Pt's daughter also present for this session.  Pt worked on getting up from recliner to [employer] and ambulated down hallway past nursing station before needing a break. Pt then propelled self down the hallway using feet to ADL apartment. He worked on Chiropodist transfer with supervision and then stepping in and out of tub with grab bars with min  assist. Pt stated that he would prefer stepping in and out of tub and using a shower chair.  He ambulated to kitchen to practice reaching for items in refrigerator and cupboards with occasional steady assist and min - mod cues to widen BOS and to move body closer versus reaching far forward.  Pt propelled self back to room with feet in w/c. Pt left in chair with daughter present waiting for his lunch to arrive. Quick release belt not applied as daughter would be staying in room with him.  Therapy Documentation Precautions:  Precautions Precautions: Fall Precaution Comments: decreased sitting and standing balance Restrictions Weight Bearing Restrictions: No     ADL:  See FIM for current functional status  Therapy/Group: Individual Therapy  [place]ton Mielke 02/19/2013, 9:15 AM

## 2013-02-19 NOTE — Progress Notes (Signed)
Speech Language Pathology Daily Session Note  Patient Details  Name: Khalif Stender MRN: [id number] Date of Birth: 10-14-1930  Today's Date: 02/19/2013 Time: 1000-1025 Time Calculation (min): 25 min  Short Term Goals: Week 1: SLP Short Term Goal 1 (Week 1): Pt will demonstrate intellectual awareness by verbalizing two cog-ling deficits with moderate question cues.  SLP Short Term Goal 2 (Week 1): Pt will increase verbal output to multiple sentence level during communication with moderate multimodal cues and 75% accuracy.   SLP Short Term Goal 3 (Week 1): Pt will increase phonatory amplitude to accomodate across his hospital room communication without listener straining and minimal verba/visual cues with 75% of opportunities.   SLP Short Term Goal 4 (Week 1): Pt will increase speech comprehensibility to 75% at phrase/sentence level with moderate multimodal cues.   SLP Short Term Goal 5 (Week 1): Pt will demonstrate sustained attention to functional basic task for 7 minutes with min question cues.    Skilled Therapeutic Interventions: Skilled treatment session focused on addressing speech intelligibly in unsturctured conversational activities.  SLP facilitated session with open ended questions and audio recorder.  SLP did not cue paitent to carryover strategeis; however, while paitent listened to recording he was able to identify errors and labed strategy he should be using.  Patient required to further cues for the session.  Continue plan of care and monitor self-monitoring.     FIM:  Comprehension Comprehension Mode: Auditory Comprehension: 5-Follows basic conversation/direction: With extra time/assistive device Expression Expression Mode: Verbal Expression: 4-Expresses basic 75 - 89% of the time/requires cueing 10 - 24% of the time. Needs helper to occlude trach/needs to repeat words. Social Interaction Social Interaction: 4-Interacts appropriately 75 - 89% of the time - Needs redirection for  appropriate language or to initiate interaction. Problem Solving Problem Solving: 4-Solves basic 75 - 89% of the time/requires cueing 10 - 24% of the time Memory Memory: 3-Recognizes or recalls 50 - 74% of the time/requires cueing 25 - 49% of the time  Pain Pain Assessment Pain Assessment: No/denies pain  Therapy/Group: Individual Therapy  Charlane Ferretti., CCC-SLP [phone]  Jakorey Mcconathy 02/19/2013, 11:13 AM

## 2013-02-19 NOTE — Progress Notes (Signed)
Social Work Patient ID: Jason Walker, male   DOB: 05/03/1930, 79 y.o.   MRN: [id number] Met with pt and daughter to inform team conference goals-supervision level and discharge 4/2.  Both pleased with how  Well pt has progressed and report he will have assist at home.  Pt informed if reaches his goals quicker than can move up Discharge date.  Daughter observing in therapies today.

## 2013-02-19 NOTE — Progress Notes (Signed)
Patient ID: Jason Walker, male   DOB: 08/28/1930, 79 y.o.   MRN: [id number] Subjective/Complaints: 79 y.o. male with history of DM, CAD, A-Fib but no anticoagulation due to life threating GIB, ICD; who was admitted to [facility] on 02/09/13 with N/V, fall and unresponsiveness. Initial CCT negative. [facility]. 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 Abdominal U/S done due to elevated amylase and CKD. This revealed 7 mm gallstone, no cholecystitis and atrophic left kidney. Carotid dopplers without ICA stenosis. 2D echo with EF 35% with diffuse hypokinesis and mild-moderate MVR. Neurology recommended continuing ASA/Plavix and close monitoring as at risk for hydrocephalus and increased ICP. Follow up CCT 03/18 with stable L-cerebellar infarct with extensive cytotoxic edema and mass effect  Slept well. No complaints today. Denies nausea.  ] A 12 point review of systems has been performed and if not noted above is otherwise negative.   Objective: Vital Signs: Blood pressure 139/74, pulse 75, temperature 98.3 F (36.8 C), temperature source Oral, resp. rate 20, height 5\' 5"  (1.651 m), weight 86.6 kg (190 lb 14.7 oz), SpO2 93.00%. No results found.  Recent Labs  02/17/13 0740  WBC 9.3  HGB 12.2*  HCT 35.7*  PLT 115*    Recent Labs  02/17/13 0740  NA 136  K 4.0  CL 97  GLUCOSE 66*  BUN 33*  CREATININE 1.85*  CALCIUM 9.1   CBG (last 3)   Recent Labs  02/18/13 1632 02/18/13 2039 02/19/13 0724  GLUCAP 222* 237* 122*    Wt Readings from Last 3 Encounters:  02/19/13 86.6 kg (190 lb 14.7 oz)  02/14/13 85.684 kg (188 lb 14.4 oz)  02/04/13 87.091 kg (192 lb)    Physical Exam:  Constitutional: He is oriented to person, place, and time. He appears well-developed and well-nourished. He appears lethargic. He is easily aroused.  HENT:  Head: Normocephalic and atraumatic.   Left Ear: External ear normal.  Eyes: Conjunctivae and EOM are normal. Pupils are equal, round, and reactive to light.  Neck: Normal range of motion. No JVD present. No tracheal deviation present. No thyromegaly present.  Cardiovascular: Normal rate. An irregularly irregular rhythm present. Exam reveals no gallop and no friction rub.  No murmur heard.  Pulmonary/Chest: Effort normal and breath sounds normal. No respiratory distress. He has no wheezes. He has no rales. He exhibits no tenderness.  Abdominal: bell soft, NT. Bowel sounds positive.  Musculoskeletal: He exhibits tenderness. He exhibits no edema.  Lymphadenopathy:  He has no cervical adenopathy.  Neurological: He is oriented to person, place, and time and easily aroused. He appears lethargic.  HOH. Speech with mild/moderate dysarthria. Left field cut with inattention.Tends to keep left eye closed--diplopia?--inconsistent tracking on EOM testing. Impaired attention with decreased insight and poor awareness of deficits.persistent ataxia LUE and decrease in fine motor control LLE. Right arm limited at shoulder due to RTC issue. Strength grossly 4/5 except for pain inhibition.  Skin: Skin is warm and dry.  Psychiatric:  Fair insight and awareness.  .    Assessment/Plan: 1. Functional deficits secondary to thrombotic left cerebellar infarct which require 3+ hours per day of interdisciplinary therapy in a comprehensive inpatient rehab setting. Physiatrist is providing close team supervision and 24 hour management of active medical problems listed below. Physiatrist and rehab team continue to assess barriers to discharge/monitor patient progress toward functional and medical goals. FIM: FIM - Bathing  Bathing Steps Patient Completed: Chest;Right Arm;Left Arm;Abdomen;Front perineal area;Buttocks;Right upper leg;Left upper leg;Right lower leg (including foot);Left lower leg (including foot) Bathing: 4: Steadying assist  FIM - Upper Body  Dressing/Undressing Upper body dressing/undressing steps patient completed: Put head through opening of pull over shirt/dress;Thread/unthread left sleeve of pullover shirt/dress;Thread/unthread right sleeve of pullover shirt/dresss;Pull shirt over trunk Upper body dressing/undressing: 5: Supervision: Safety issues/verbal cues FIM - Lower Body Dressing/Undressing Lower body dressing/undressing steps patient completed: Thread/unthread right pants leg;Thread/unthread left pants leg;Pull pants up/down;Don/Doff left sock;Don/Doff right sock;Fasten/unfasten left shoe;Fasten/unfasten right shoe;Don/Doff right shoe;Don/Doff left shoe Lower body dressing/undressing: 4: Steadying Assist  FIM - Toileting Toileting steps completed by patient: Performs perineal hygiene;Adjust clothing prior to toileting;Adjust clothing after toileting Toileting Assistive Devices: Grab bar or rail for support Toileting: 5: Supervision: Safety issues/verbal cues  FIM - Diplomatic Services operational officer Devices: Best boy Transfers: 4-To toilet/BSC: Min A (steadying Pt. > 75%);4-From toilet/BSC: Min A (steadying Pt. > 75%)  FIM - Banker Devices: Walker;Arm rests;HOB elevated;Bed rails Bed/Chair Transfer: 5: Supine > Sit: Supervision (verbal cues/safety issues);4: Bed > Chair or W/C: Min A (steadying Pt. > 75%);4: Chair or W/C > Bed: Min A (steadying Pt. > 75%)  FIM - Locomotion: Wheelchair Distance: 150 Locomotion: Wheelchair: 4: Travels 150 ft or more: maneuvers on rugs and over door sillls with minimal assistance (Pt.>75%) FIM - Locomotion: Ambulation Locomotion: Ambulation Assistive Devices: Designer, industrial/product Ambulation/Gait Assistance: 4: Min assist;3: Mod assist Locomotion: Ambulation: 3: Travels 150 ft or more with moderate assistance (Pt: 50 - 74%)  Comprehension Comprehension Mode: Auditory Comprehension: 5-Understands basic 90% of the  time/requires cueing < 10% of the time  Expression Expression Mode: Verbal Expression: 3-Expresses basic 50 - 74% of the time/requires cueing 25 - 50% of the time. Needs to repeat parts of sentences.  Social Interaction Social Interaction: 2-Interacts appropriately 25 - 49% of time - Needs frequent redirection.  Problem Solving Problem Solving: 3-Solves basic 50 - 74% of the time/requires cueing 25 - 49% of the time  Memory Memory: 2-Recognizes or recalls 25 - 49% of the time/requires cueing 51 - 75% of the time Medical Problem List and Plan:  1. DVT Prophylaxis/Anticoagulation: Pharmaceutical: Lovenox, plt low, cont to monitor, no sign of bleeding 2. Pain Management: on Depakote 500 g for headaches. Will decrease dose to 250 mg and change timing to bedtime.  3. Mood: Seems to be distracted with impaired awareness. Will have LCSW follow up for evaluation.  4. Neuropsych: This patient Is not capable of making decisions on his/her own behalf.  5. HTN: Monitor with bid checks. Borderline---no changes at present 6. CAD/Chronic systolic CHF/ICM with ICD: Check daily weights.  . Will titrated diuretics as needed to home dose.  7. CKD Stage IV: RUE graft placed last fall. Was on lasix 80 mg tid with FR [phone] cc/day. Baseline weight at 185-190 Lbs. Baseline Cr<3.0. --follow weights and i's and o's 8. DM type 2: Hgb A1C @ 10. Monitor with AC/HS cbg checks. Po intake poor at this time.Increase lantus 25 units--was on 35 units at home.. Will continue SSI for elevated BS.   -fair control still.  9. Atrial Fibrillation with history of ventricular arrhthymias: monitor HR with bid checks. Continue coreg bid 10. Nausea: improved after bm's.   LOS (Days) 5 A FACE TO FACE EVALUATION WAS PERFORMED  KIRSTEINS,ANDREW E 02/19/2013 8:21 AM

## 2013-02-19 NOTE — Patient Care Conference (Signed)
Inpatient RehabilitationTeam Conference and Plan of Care Update Date: 02/19/2013   Time: 11:30 AM    Patient Name: Jason Walker      Medical Record Number: [id number]  Date of Birth: 1930-04-15 Sex: Male         Room/Bed: 4029/4029-01 Payor Info: Payor: MEDICARE  Plan: MEDICARE PART A AND B  Product Type: *No Product type*     Admitting Diagnosis: L CVA  Admit Date/Time:  02/14/2013  4:16 PM Admission Comments: No comment available   Primary Diagnosis:  CVA (cerebral infarction) Principal Problem: CVA (cerebral infarction)  Patient Active Problem List   Diagnosis Date Noted  . CVA (cerebral infarction) 02/09/2013  . DM type 1 (diabetes mellitus, type 1) 02/09/2013  . CKD (chronic kidney disease) stage 4, GFR 15-29 ml/min 02/09/2013  . Atrial fibrillation 02/12/2012  . AUTOMATIC IMPLANTABLE CARDIAC DEFIBRILLATOR SITU 07/19/2010  . DM 03/24/2010  . AMI 03/24/2010  . CAD 03/24/2010  . RBBB 03/24/2010  . VENTRICULAR TACHYCARDIA, PAROXYSMAL 03/24/2010  . Chronic systolic heart failure 03/24/2010  . AAA 03/24/2010    Expected Discharge Date: Expected Discharge Date: 02/26/13  Team Members Present: Physician leading conference: Dr. Claudette Laws Social Worker Present: Dossie Der, LCSW Nurse Present: Carlean Purl, RN PT Present: Edman Circle, PT;Other (comment) Clarisse Gouge Ripa_PT) OT Present: Bretta Bang, Verlene Mayer, OT SLP Present: Fae Pippin, SLP Other (Discipline and Name): Charolette Child Coordinator     Current Status/Progress Goal Weekly Team Focus  Medical   uncontrolled DM, ataxia improving  Improve CBG control  DM med adjustment   Bowel/Bladder     urgency   cont B & B   timed toileting  Swallow/Nutrition/ Hydration     na        ADL's   steady assist with self care, min assist with ambulation/transfers with RW, improving coordination in trunk and RUE, daily family ed with wife  Supervision overall  ADL retraining, dynamic balance, RUE coordination  and strength , sequencing, family/pt education   Mobility   min-mod A  S overall   transfers, gait, stairs, postural control in standing, dynamic standing balance, safety   Communication   supervision-min assist   supervision   increase self-monitoring and correcting   Safety/Cognition/ Behavioral Observations  min assist   supervision   increase self-monioring and correcting   Pain     na        Skin     na           *See Interdisciplinary Assessment and Plan and progress notes for long and short-term goals  Barriers to Discharge: bowel and bladder incont    Possible Resolutions to Barriers:  toileting program    Discharge Planning/Teaching Needs:  Home with wife, family to provide 24 hr assist while wife works.  Aware of pt's need due to balance and safety issues      Team Discussion:  Pt progressing well, incont  b & b working on.  Family here to observe and aware of care needed at discharge.  Revisions to Treatment Plan:  None   Continued Need for Acute Rehabilitation Level of Care: The patient requires daily medical management by a physician with specialized training in physical medicine and rehabilitation for the following conditions: Daily direction of a multidisciplinary physical rehabilitation program to ensure safe treatment while eliciting the highest outcome that is of practical value to the patient.: Yes Daily medical management of patient stability for increased activity during participation in an intensive rehabilitation regime.:  Yes Daily analysis of laboratory values and/or radiology reports with any subsequent need for medication adjustment of medical intervention for : Neurological problems;Other  Kimara Bencomo, Lemar Livings 02/20/2013, 9:05 AM

## 2013-02-19 NOTE — Progress Notes (Signed)
Inpatient Diabetes Program Recommendations  AACE/ADA: New Consensus Statement on Inpatient Glycemic Control (2013)  Target Ranges:  Prepandial:   less than 140 mg/dL      Peak postprandial:   less than 180 mg/dL (1-2 hours)      Critically ill patients:  140 - 180 mg/dL   Noted Lantus dose increased to 25 units.  This affect is seen primarily with the fasting glucose.  Fasting glucose yesterday was 95 mg/dL and today was 161 mg/dL  Pt does not need an increase in lantus.  Appears only to need an increase in meal coverage for lunch and dinner where the cbg's rise following those meals. Recommend increase meal coverage to 5 units for lunch and supper and to keep 3 units for breakfast. Please keep the lantus dose at 20 units to avoid fasting hypoglycemia.  Thank you, Lenor Coffin, RN, CNS, Diabetes Coordinator [phone])

## 2013-02-19 NOTE — Progress Notes (Signed)
Physical Therapy Note  Patient Details  Name: Zinedine Ellner MRN: [id number] Date of Birth: [birth year] Today's Date: 02/19/2013  4098-1191 (55 minutes) individual Pain: no complaint of pain Focus of treatment: Caregiver education with daughter ( steps, car transfer , bed mobility , fall risks); therapeutic activities focused on standing balance/ protective balance reactions; gait training/endurance; wc mobility /endurance; therapeutic exercise focused on activity tolerance Treatment : Daughter instructed in and redemonstrated the following with patient ; car transfer with RW close SBA ; up/down curb (1 step) with RW min assist ; up/down 2 steps with one rail (sideways) close SBA; bed >< wc transfer with RW SBA; pt performed sit >< supine on std. Bed SBA to independent ; discussed the following safety issues with daughter- reaching backward before sitting , pt potential posterior loss of balance in standing with decreased protective balance reactions.    1300-1330 (30 minutes) individual Pain: no reported pain Focus of treatment: gait training Treatment: gait 75 feet X 2 using blue theraband to resist at hip to challenge balance/ hip extension strengthening in stance.   Kobie Matkins,JIM 02/19/2013, 9:52 AM

## 2013-02-20 ENCOUNTER — Inpatient Hospital Stay (HOSPITAL_COMMUNITY): Payer: Medicare Other | Admitting: Physical Therapy

## 2013-02-20 ENCOUNTER — Inpatient Hospital Stay (HOSPITAL_COMMUNITY): Payer: Medicare Other | Admitting: Speech Pathology

## 2013-02-20 ENCOUNTER — Inpatient Hospital Stay (HOSPITAL_COMMUNITY): Payer: Medicare Other

## 2013-02-20 ENCOUNTER — Inpatient Hospital Stay (HOSPITAL_COMMUNITY): Payer: Medicare Other | Admitting: Occupational Therapy

## 2013-02-20 DIAGNOSIS — I69993 Ataxia following unspecified cerebrovascular disease: Secondary | ICD-10-CM

## 2013-02-20 DIAGNOSIS — I6340 Cerebral infarction due to embolism of unspecified cerebral artery: Secondary | ICD-10-CM

## 2013-02-20 LAB — BASIC METABOLIC PANEL
BUN: 30 mg/dL — ABNORMAL HIGH (ref 6–23)
Chloride: 96 mEq/L (ref 96–112)
GFR calc Af Amer: 35 mL/min — ABNORMAL LOW (ref >90)
Glucose, Bld: 162 mg/dL — ABNORMAL HIGH (ref 70–99)
Potassium: 3.9 mEq/L (ref 3.5–5.1)

## 2013-02-20 LAB — GLUCOSE, CAPILLARY
Glucose-Capillary: 221 mg/dL — ABNORMAL HIGH (ref 70–99)
Glucose-Capillary: 284 mg/dL — ABNORMAL HIGH (ref 70–99)

## 2013-02-20 MED ORDER — FUROSEMIDE 40 MG PO TABS
40.0000 mg | ORAL_TABLET | Freq: Two times a day (BID) | ORAL | Status: DC
  Administered 2013-02-21 – 2013-02-25 (×9): 40 mg via ORAL
  Filled 2013-02-20 (×11): qty 1

## 2013-02-20 MED ORDER — INSULIN GLARGINE 100 UNIT/ML ~~LOC~~ SOLN
30.0000 [IU] | Freq: Every day | SUBCUTANEOUS | Status: DC
  Administered 2013-02-20 – 2013-02-24 (×5): 30 [IU] via SUBCUTANEOUS
  Filled 2013-02-20 (×8): qty 0.3

## 2013-02-20 NOTE — Progress Notes (Signed)
Occupational Therapy Session Note  Patient Details  Name: Alastair Hennes MRN: [id number] Date of Birth: [birth year]  Today's Date: 02/20/2013 Time: 0800-0900 Time Calculation (min): 60 min  Short Term Goals: Week 1:  OT Short Term Goal 1 (Week 1): Pt will transfer to [facility] to with min A OT Short Term Goal 2 (Week 1): Pt will don LB clothing with min A OT Short Term Goal 3 (Week 1): Pt will perform sit to stand wtih steady A for clothing management OT Short Term Goal 4 (Week 1): Pt will perform bed mobility wtih rail with supervision  Skilled Therapeutic Interventions/Progress Updates:      Pt seen for BADL retraining of toileting, bathing, and dressing with a focus on activity tolerance, standing balance, and active use of RUE.  Pt used his RUE actively throughout the session to help don socks and tie shoes.  He needed 1 cue to push up from chair versus pulling on the walker.  In standing to pull his pants he did start to have a posterior lean but self corrected without physical assistance.  He seemed to fatigue and become slightly SOB more easily today.  Pt then propelled himself to gym to work on RUE strengthening with UBE.  His RHR was 72 and it elevated to 133 after 5 min on the UBE. Pt rested, continued exercise and maintained exercise HR at 92.  Pt's PT had arrived for his next PT session.  Therapy Documentation Precautions:  Precautions Precautions: Fall Precaution Comments: decreased sitting and standing balance Restrictions Weight Bearing Restrictions: No  Pain: Pain Assessment Pain Assessment: No/denies pain  ADL:  See FIM for current functional status  Therapy/Group: Individual Therapy  Candice Tobey 02/20/2013, 11:44 AM

## 2013-02-20 NOTE — Progress Notes (Signed)
Speech Language Pathology Daily Session Note  Patient Details  Name: Jason Walker MRN: [id number] Date of Birth: 02/05/1930  Today's Date: 02/20/2013 Time: 1530-1600 Time Calculation (min): 30 min  Short Term Goals: Week 1: SLP Short Term Goal 1 (Week 1): Pt will demonstrate intellectual awareness by verbalizing two cog-ling deficits with moderate question cues.  SLP Short Term Goal 2 (Week 1): Pt will increase verbal output to multiple sentence level during communication with moderate multimodal cues and 75% accuracy.   SLP Short Term Goal 3 (Week 1): Pt will increase phonatory amplitude to accomodate across his hospital room communication without listener straining and minimal verba/visual cues with 75% of opportunities.   SLP Short Term Goal 4 (Week 1): Pt will increase speech comprehensibility to 75% at phrase/sentence level with moderate multimodal cues.   SLP Short Term Goal 5 (Week 1): Pt will demonstrate sustained attention to functional basic task for 7 minutes with min question cues.    Skilled Therapeutic Interventions: Skilled treatment session focused on addressing speech intelligibility.  Patient was Mod I for speech intelligibility during sturctured and supervision level verbal cues to self monitor and correct during unstructured conversational activities.     FIM:  Comprehension Comprehension Mode: Auditory Comprehension: 4-Understands basic 75 - 89% of the time/requires cueing 10 - 24% of the time Expression Expression Mode: Verbal Expression: 5-Expresses basic 90% of the time/requires cueing < 10% of the time. Social Interaction Social Interaction: 5-Interacts appropriately 90% of the time - Needs monitoring or encouragement for participation or interaction. Problem Solving Problem Solving: 3-Solves basic 50 - 74% of the time/requires cueing 25 - 49% of the time Memory Memory: 3-Recognizes or recalls 50 - 74% of the time/requires cueing 25 - 49% of the  time  Pain Pain Assessment Pain Assessment: No/denies pain  Therapy/Group: Individual Therapy  Charlane Ferretti., CCC-SLP [phone]  Nura Cahoon 02/20/2013, 4:07 PM

## 2013-02-20 NOTE — Progress Notes (Signed)
PPatient ID: Jason Walker, male   DOB: January 20, 1930, 79 y.o.   MRN: [id number] Subjective/Complaints: 79 y.o. male with history of DM, CAD, A-Fib but no anticoagulation due to life threating GIB, ICD; who was admitted to [facility] on 02/09/13 with N/V, fall and unresponsiveness. Initial CCT negative. [facility]. 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 Abdominal U/S done due to elevated amylase and CKD. This revealed 7 mm gallstone, no cholecystitis and atrophic left kidney. Carotid dopplers without ICA stenosis. 2D echo with EF 35% with diffuse hypokinesis and mild-moderate MVR. Neurology recommended continuing ASA/Plavix and close monitoring as at risk for hydrocephalus and increased ICP. Follow up CCT 03/18 with stable L-cerebellar infarct with extensive cytotoxic edema and mass effect  Slept well. No complaints today. Denies nausea.  ] A 12 point review of systems has been performed and if not noted above is otherwise negative.   Objective: Vital Signs: Blood pressure 159/66, pulse 101, temperature 97.9 F (36.6 C), temperature source Oral, resp. rate 20, height 5\' 5"  (1.651 m), weight 87.1 kg (192 lb 0.3 oz), SpO2 99.00%. No results found. No results found for this basename: WBC, HGB, HCT, PLT,  in the last 72 hours  Recent Labs  02/20/13 0518  NA 134*  K 3.9  CL 96  GLUCOSE 162*  BUN 30*  CREATININE 1.98*  CALCIUM 9.0   CBG (last 3)   Recent Labs  02/19/13 1705 02/19/13 2049 02/20/13 0710  GLUCAP 249* 239* 145*    Wt Readings from Last 3 Encounters:  02/20/13 87.1 kg (192 lb 0.3 oz)  02/14/13 85.684 kg (188 lb 14.4 oz)  02/04/13 87.091 kg (192 lb)    Physical Exam:  Constitutional: He is oriented to person, place, and time. He appears well-developed and well-nourished. He appears lethargic. He is easily aroused.  HENT:  Head: Normocephalic and  atraumatic.  Left Ear: External ear normal.  Eyes: Conjunctivae and EOM are normal. Pupils are equal, round, and reactive to light.  Neck: Normal range of motion. No JVD present. No tracheal deviation present. No thyromegaly present.  Cardiovascular: Normal rate. An irregularly irregular rhythm present. Exam reveals no gallop and no friction rub.  No murmur heard.  Pulmonary/Chest: Effort normal and breath sounds normal. No respiratory distress. He has no wheezes. He has no rales. He exhibits no tenderness.  Abdominal: bell soft, NT. Bowel sounds positive.  Musculoskeletal: He exhibits tenderness. He exhibits no edema.  Lymphadenopathy:  He has no cervical adenopathy.  Neurological: He is oriented to person, place, and time and easily aroused. He appears lethargic.  HOH. Speech with mild/moderate dysarthria. Left field cut with inattention.Tends to keep left eye closed--diplopia?--inconsistent tracking on EOM testing. Impaired attention with decreased insight and poor awareness of deficits.persistent ataxia LUE and decrease in fine motor control LLE. Right arm limited at shoulder due to RTC issue. Strength grossly 4/5 except for pain inhibition.  Skin: Skin is warm and dry.  Psychiatric:  Fair insight and awareness.  .    Assessment/Plan: 1. Functional deficits secondary to thrombotic left cerebellar infarct which require 3+ hours per day of interdisciplinary therapy in a comprehensive inpatient rehab setting. Physiatrist is providing close team supervision and 24 hour management of active medical problems listed below. Physiatrist and rehab team continue to assess barriers to discharge/monitor patient progress toward functional and medical goals. FIM: FIM - Bathing Bathing Steps Patient Completed:  Chest;Right Arm;Left Arm;Abdomen;Front perineal area;Buttocks;Right upper leg;Left upper leg;Right lower leg (including foot);Left lower leg (including foot) Bathing: 5: Supervision: Safety  issues/verbal cues  FIM - Upper Body Dressing/Undressing Upper body dressing/undressing steps patient completed: Put head through opening of pull over shirt/dress;Thread/unthread left sleeve of pullover shirt/dress;Thread/unthread right sleeve of pullover shirt/dresss;Pull shirt over trunk Upper body dressing/undressing: 5: Supervision: Safety issues/verbal cues FIM - Lower Body Dressing/Undressing Lower body dressing/undressing steps patient completed: Thread/unthread right pants leg;Thread/unthread left pants leg;Pull pants up/down;Don/Doff left sock;Don/Doff right sock;Fasten/unfasten left shoe;Fasten/unfasten right shoe;Don/Doff right shoe;Don/Doff left shoe Lower body dressing/undressing: 5: Supervision: Safety issues/verbal cues  FIM - Toileting Toileting steps completed by patient: Performs perineal hygiene;Adjust clothing prior to toileting;Adjust clothing after toileting Toileting Assistive Devices: Grab bar or rail for support Toileting: 5: Supervision: Safety issues/verbal cues  FIM - Diplomatic Services operational officer Devices: Best boy Transfers: 4-To toilet/BSC: Min A (steadying Pt. > 75%);4-From toilet/BSC: Min A (steadying Pt. > 75%)  FIM - Banker Devices: Walker;Arm rests;HOB elevated;Bed rails Bed/Chair Transfer: 5: Supine > Sit: Supervision (verbal cues/safety issues);4: Bed > Chair or W/C: Min A (steadying Pt. > 75%);4: Chair or W/C > Bed: Min A (steadying Pt. > 75%)  FIM - Locomotion: Wheelchair Distance: 150 Locomotion: Wheelchair: 4: Travels 150 ft or more: maneuvers on rugs and over door sillls with minimal assistance (Pt.>75%) FIM - Locomotion: Ambulation Locomotion: Ambulation Assistive Devices: Designer, industrial/product Ambulation/Gait Assistance: 4: Min assist;3: Mod assist Locomotion: Ambulation: 3: Travels 150 ft or more with moderate assistance (Pt: 50 - 74%)  Comprehension Comprehension Mode:  Auditory Comprehension: 4-Understands basic 75 - 89% of the time/requires cueing 10 - 24% of the time  Expression Expression Mode: Verbal Expression: 3-Expresses basic 50 - 74% of the time/requires cueing 25 - 50% of the time. Needs to repeat parts of sentences.  Social Interaction Social Interaction: 3-Interacts appropriately 50 - 74% of the time - May be physically or verbally inappropriate.  Problem Solving Problem Solving: 3-Solves basic 50 - 74% of the time/requires cueing 25 - 49% of the time  Memory Memory: 3-Recognizes or recalls 50 - 74% of the time/requires cueing 25 - 49% of the time Medical Problem List and Plan:  1. DVT Prophylaxis/Anticoagulation: Pharmaceutical: Lovenox, plt low, cont to monitor, no sign of bleeding 2. Pain Management: on Depakote 500 g for headaches. Will decrease dose to 250 mg and change timing to bedtime.  3. Mood: Seems to be distracted with impaired awareness. Will have LCSW follow up for evaluation.  4. Neuropsych: This patient Is not capable of making decisions on his/her own behalf.  5. HTN: Monitor with bid checks. Borderline---no changes at present 6. CAD/Chronic systolic CHF/ICM with ICD: Check daily weights.  . Will titrated diuretics as needed to home dose.  7. CKD Stage IV: RUE graft placed last fall. Was on lasix 80 mg tid with FR [phone] cc/day. Baseline weight at 185-190 Lbs. Baseline Cr<3.0. --follow Daily weights and i's and o's 8. DM type 2: Hgb A1C @ 10. Monitor with AC/HS cbg checks. Po intake poor at this time.Increase lantus 25 units--was on 35 units at home.. Will continue SSI for elevated BS.   -fair control still.  9. Atrial Fibrillation with history of ventricular arrhthymias: monitor HR with bid checks. Continue coreg bid 10. Nausea: improved after bm's.   LOS (Days) 6 A FACE TO FACE EVALUATION WAS PERFORMED  Erick Colace 02/20/2013 8:13 AM

## 2013-02-20 NOTE — Progress Notes (Signed)
Physical Therapy Session Note  Patient Details  Name: Jason Walker MRN: [id number] Date of Birth: 10/15/1930  Today's Date: 02/20/2013 Time: 4098-1191 Time Calculation (min): 45 min  Short Term Goals: Week 1:  PT Short Term Goal 1 (Week 1): Patient will perform bed mobility with min assist. PT Short Term Goal 2 (Week 1): Patient will perform stand pivot transfers with LRAD and min assist. PT Short Term Goal 3 (Week 1): Patient will ambulate 34' with LRAD and min assist. PT Short Term Goal 4 (Week 1): Patient will negotiate 5 steps with B handrails and min assist.  Skilled Therapeutic Interventions/Progress Updates:    Session focused on dynamic gait and balance activities, overall endurance/activity tolerance, and strength. Completed dynamic gait through obstacle course with steady A; cues for safety with RW. Stair training with min A up/down 5 steps with bilateral rails; reciprocal pattern. Nustep for endurance and strengthening x 5 min with multiple rest breaks needed on level 4. Gait back to pt room with close S/steady A with cues for posture and safety with RW.   Therapy Documentation Precautions:  Precautions Precautions: Fall Precaution Comments: decreased sitting and standing balance Restrictions Weight Bearing Restrictions: No  Pain:  Denies pain.   Locomotion : Ambulation Ambulation/Gait Assistance: 4: Min guard   See FIM for current functional status  Therapy/Group: Individual Therapy  [facility] 02/20/2013, 3:57 PM

## 2013-02-20 NOTE — Progress Notes (Signed)
Physical Therapy Session Note  Patient Details  Name: Jason Walker MRN: [id number] Date of Birth: 04-Dec-1929  Today's Date: 02/20/2013 Time: 0900-0955 Time Calculation (min): 55 min  Short Term Goals: Week 1:  PT Short Term Goal 1 (Week 1): Patient will perform bed mobility with min assist. PT Short Term Goal 2 (Week 1): Patient will perform stand pivot transfers with LRAD and min assist. PT Short Term Goal 3 (Week 1): Patient will ambulate 53' with LRAD and min assist. PT Short Term Goal 4 (Week 1): Patient will negotiate 5 steps with B handrails and min assist.  Skilled Therapeutic Interventions/Progress Updates:    Tall kneeling activity on mat focusing on trunk and pelvic stability without UE support. Worked on simple reaching tasks, pt requires close supervision to min assist to maintain position and cues needed for hip extension, limited by fatigue. Static standing activities with one foot on block, UE "figure 8s" with red weighted ball to promote improved balance strategies with single limb stance and to decrease bil. UE reliance. Ambulation x 175', 80' with RW and min assist for two lateral losses of balance, otherwise close supervision. Cues for improved step through with Rt. LE. Pt has tendency to have posterior loss of balance in general and Rt. Loss of balance with turns.   HR up to 107 during session. Pt needs multiple rest breaks throughout session, reports lack of sleep last night. Pt returned to bed at end of session for rest.   Therapy Documentation Precautions:  Precautions Precautions: Fall Precaution Comments: decreased sitting and standing balance Restrictions Weight Bearing Restrictions: No Pain: Pain Assessment Pain Assessment: No/denies pain Pain Score:  (not rated) Pain Type: Chronic pain Pain Location: Hip Pain Orientation:  (bilateral) Pain Descriptors: Aching Pain Onset: With Activity Pain Intervention(s):  (rest as needed)  See FIM for current  functional status  Therapy/Group: Individual Therapy  Wilhemina Bonito 02/20/2013, 12:01 PM

## 2013-02-21 ENCOUNTER — Inpatient Hospital Stay (HOSPITAL_COMMUNITY): Payer: Medicare Other | Admitting: Physical Therapy

## 2013-02-21 ENCOUNTER — Inpatient Hospital Stay (HOSPITAL_COMMUNITY): Payer: Medicare Other | Admitting: Speech Pathology

## 2013-02-21 ENCOUNTER — Inpatient Hospital Stay (HOSPITAL_COMMUNITY): Payer: Medicare Other | Admitting: Occupational Therapy

## 2013-02-21 DIAGNOSIS — I6340 Cerebral infarction due to embolism of unspecified cerebral artery: Secondary | ICD-10-CM

## 2013-02-21 DIAGNOSIS — I69993 Ataxia following unspecified cerebrovascular disease: Secondary | ICD-10-CM

## 2013-02-21 NOTE — Progress Notes (Addendum)
Inpatient Diabetes Program Recommendations  AACE/ADA: New Consensus Statement on Inpatient Glycemic Control (2013)  Target Ranges:  Prepandial:   less than 140 mg/dL      Peak postprandial:   less than 180 mg/dL (1-2 hours)      Critically ill patients:  140 - 180 mg/dL   Fasting glucose at 88 mg/dL today. Got 30 units lantus last night. Glucose last HS was 289 mg/dL.Marland Kitchenthis may have kept him from going hypoglycemia this morning. Pt did well with 25 units lantus.  Please decrease from 30 units which primarily affects fasting glucose.  Pt only on meal coverage with breakfast and lunch. Please add supper/dinner coverage as well. HS glucose is high in upper 200's    Thank you, Lenor Coffin, RN, CNS, Diabetes Coordinator [phone])

## 2013-02-21 NOTE — Progress Notes (Signed)
PPatient ID: Jason Walker, male   DOB: 09-11-1930, 79 y.o.   MRN: [id number] Subjective/Complaints: 79 y.o. male with history of DM, CAD, A-Fib but no anticoagulation due to life threating GIB, ICD; who was admitted to [facility] on 02/09/13 with N/V, fall and unresponsiveness. Initial CCT negative. [facility]. 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 Abdominal U/S done due to elevated amylase and CKD. This revealed 7 mm gallstone, no cholecystitis and atrophic left kidney. Carotid dopplers without ICA stenosis. 2D echo with EF 35% with diffuse hypokinesis and mild-moderate MVR. Neurology recommended continuing ASA/Plavix and close monitoring as at risk for hydrocephalus and increased ICP. Follow up CCT 03/18 with stable L-cerebellar infarct with extensive cytotoxic edema and mass effect  Slept well. No complaints today. Denies nausea.  No major complaints HOH A 12 point review of systems has been performed and if not noted above is otherwise negative.   Objective: Vital Signs: Blood pressure 145/65, pulse 71, temperature 98.2 F (36.8 C), temperature source Oral, resp. rate 18, height 5\' 5"  (1.651 m), weight 86.5 kg (190 lb 11.2 oz), SpO2 96.00%. No results found. No results found for this basename: WBC, HGB, HCT, PLT,  in the last 72 hours  Recent Labs  02/20/13 0518  NA 134*  K 3.9  CL 96  GLUCOSE 162*  BUN 30*  CREATININE 1.98*  CALCIUM 9.0   CBG (last 3)   Recent Labs  02/20/13 1638 02/20/13 2041 02/21/13 0718  GLUCAP 262* 284* 88    Wt Readings from Last 3 Encounters:  02/21/13 86.5 kg (190 lb 11.2 oz)  02/14/13 85.684 kg (188 lb 14.4 oz)  02/04/13 87.091 kg (192 lb)    Physical Exam:  Constitutional: He is oriented to person, place, and time. He appears well-developed and well-nourished. Marland Kitchen He is Alert    HENT:  Head: Normocephalic and atraumatic.   Left Ear: External ear normal.  Eyes: Conjunctivae and EOM are normal. Pupils are equal, round, and reactive to light.  Neck: Normal range of motion. No JVD present. No tracheal deviation present. No thyromegaly present.  Cardiovascular: Normal rate. An irregularly irregular rhythm present. Exam reveals no gallop and no friction rub.  No murmur heard.  Pulmonary/Chest: Effort normal and breath sounds normal. No respiratory distress. He has no wheezes. He has no rales. He exhibits no tenderness.  Abdominal: bell soft, NT. Bowel sounds positive.  Musculoskeletal: He exhibits tenderness. He exhibits no edema.  Lymphadenopathy:  He has no cervical adenopathy.  Neurological: He is oriented to person, place, and time and easily aroused. He appears lethargic.  HOH. Speech with mild/moderate dysarthria. Left field cut with inattention. Impaired attention with decreased insight and poor awareness of deficits.persistent ataxia LUE and decrease in fine motor control LLE. Right arm limited at shoulder due to RTC issue. Strength grossly 4/5 except for pain inhibition.  Skin: Skin is warm and dry.  Psychiatric:  Fair insight and awareness.  .    Assessment/Plan: 1. Functional deficits secondary to thrombotic left cerebellar infarct which require 3+ hours per day of interdisciplinary therapy in a comprehensive inpatient rehab setting. Physiatrist is providing close team supervision and 24 hour management of active medical problems listed below. Physiatrist and rehab team continue to assess barriers to discharge/monitor patient progress toward functional and medical goals. FIM: FIM - Bathing Bathing Steps Patient Completed: Chest;Right Arm;Left Arm;Abdomen;Front perineal area;Buttocks;Right upper leg;Left  upper leg;Right lower leg (including foot);Left lower leg (including foot) Bathing: 5: Supervision: Safety issues/verbal cues  FIM - Upper Body Dressing/Undressing Upper body dressing/undressing steps  patient completed: Put head through opening of pull over shirt/dress;Thread/unthread left sleeve of pullover shirt/dress;Thread/unthread right sleeve of pullover shirt/dresss;Pull shirt over trunk Upper body dressing/undressing: 5: Set-up assist to: Obtain clothing/put away FIM - Lower Body Dressing/Undressing Lower body dressing/undressing steps patient completed: Thread/unthread right underwear leg;Thread/unthread left underwear leg;Pull underwear up/down;Thread/unthread right pants leg;Thread/unthread left pants leg;Pull pants up/down;Don/Doff left sock;Don/Doff right shoe;Don/Doff right sock;Don/Doff left shoe;Fasten/unfasten left shoe;Fasten/unfasten right shoe Lower body dressing/undressing: 5: Supervision: Safety issues/verbal cues  FIM - Toileting Toileting steps completed by patient: Adjust clothing prior to toileting;Performs perineal hygiene;Adjust clothing after toileting Toileting Assistive Devices: Grab bar or rail for support Toileting: 5: Supervision: Safety issues/verbal cues  FIM - Diplomatic Services operational officer Devices: Best boy Transfers: 4-To toilet/BSC: Min A (steadying Pt. > 75%);4-From toilet/BSC: Min A (steadying Pt. > 75%)  FIM - Banker Devices: Walker;Arm rests;HOB elevated;Bed rails Bed/Chair Transfer: 5: Sit > Supine: Supervision (verbal cues/safety issues);5: Bed > Chair or W/C: Supervision (verbal cues/safety issues);5: Chair or W/C > Bed: Supervision (verbal cues/safety issues)  FIM - Locomotion: Wheelchair Distance: 150 Locomotion: Wheelchair: 5: Travels 150 ft or more: maneuvers on rugs and over door sills with supervision, cueing or coaxing FIM - Locomotion: Ambulation Locomotion: Ambulation Assistive Devices: Designer, industrial/product Ambulation/Gait Assistance: 4: Min guard Locomotion: Ambulation: 4: Travels 150 ft or more with minimal assistance (Pt.>75%)  Comprehension Comprehension Mode:  Auditory Comprehension: 4-Understands basic 75 - 89% of the time/requires cueing 10 - 24% of the time  Expression Expression Mode: Verbal Expression: 5-Expresses basic 90% of the time/requires cueing < 10% of the time.  Social Interaction Social Interaction: 5-Interacts appropriately 90% of the time - Needs monitoring or encouragement for participation or interaction.  Problem Solving Problem Solving: 3-Solves basic 50 - 74% of the time/requires cueing 25 - 49% of the time  Memory Memory: 3-Recognizes or recalls 50 - 74% of the time/requires cueing 25 - 49% of the time Medical Problem List and Plan:  1. DVT Prophylaxis/Anticoagulation: Pharmaceutical: Lovenox, plt low, cont to monitor, no sign of bleeding 2. Pain Management: on Depakote 500 g for headaches. Will decrease dose to 250 mg and change timing to bedtime.  3. Mood: Seems to be distracted with impaired awareness. Will have LCSW follow up for evaluation.  4. Neuropsych: This patient Is not capable of making decisions on his/her own behalf.  5. HTN: Monitor with bid checks. Borderline---no changes at present 6. CAD/Chronic systolic CHF/ICM with ICD: Check daily weights.  . Will titrated diuretics as needed to home dose.  7. CKD Stage IV: RUE graft placed last fall. Was on lasix 80 mg tid with FR [phone] cc/day. Baseline weight at 185-190 Lbs. Baseline Cr<3.0. --follow Daily weights and i's and o's 8. DM type 2: Hgb A1C @ 10. Monitor with AC/HS cbg checks. Po intake poor at this time.Increase lantus 25 units--was on 35 units at home.. Will continue SSI for elevated BS.   -fair control still.  9. Atrial Fibrillation with history of ventricular arrhthymias: monitor HR with bid checks. Continue coreg bid 10. Nausea: improved after bm's.   LOS (Days) 7 A FACE TO FACE EVALUATION WAS PERFORMED  Claudette Laws E 02/21/2013 8:45 AM

## 2013-02-21 NOTE — Plan of Care (Signed)
Problem: RH BLADDER ELIMINATION Goal: RH STG MANAGE BLADDER WITH MEDICATION WITH ASSISTANCE STG Manage Bladder With Medication With max Assistance.  Outcome: Not Applicable Date Met:  02/21/13 Not on any bladder medications at this time

## 2013-02-21 NOTE — Progress Notes (Signed)
Social Work Patient ID: Jason Walker, male   DOB: [birth year], 79 y.o.   MRN: [id number] Met with pt and wife to discuss follow up therapies and both request home health feel it would benefit him working in his home environment. Also request [facility] since have used them before.  Work toward discharge.

## 2013-02-21 NOTE — Progress Notes (Signed)
Occupational Therapy Weekly Progress Note  Patient Details  Name: Jason Walker MRN: [id number] Date of Birth: 04/13/1930  Today's Date: 02/21/2013 Time: 0900-1030 Time Calculation (min): 90 min  Patient has met 4 of 4 short term goals.  Pt has made excellent progress with his RUE coordination, dynamic balance, activity tolerance.  He has almost met all of his LTGs and will be ready for discharge soon.  Patient continues to demonstrate the following deficits: decreased grip and finger strength in right versus left, slight posterior lean with sit to stand, decreased postural control with sit to stand, decreased dynamic standing balance and therefore will continue to benefit from skilled OT intervention to enhance overall performance with BADL.  Patient progressing toward long term goals. Continue plan of care.  OT Short Term Goals Week 1:  OT Short Term Goal 1 (Week 1): Pt will transfer to [facility] to with min A OT Short Term Goal 1 - Progress (Week 1): Met OT Short Term Goal 2 (Week 1): Pt will don LB clothing with min A OT Short Term Goal 2 - Progress (Week 1): Met OT Short Term Goal 3 (Week 1): Pt will perform sit to stand wtih steady A for clothing management OT Short Term Goal 3 - Progress (Week 1): Met OT Short Term Goal 4 (Week 1): Pt will perform bed mobility wtih rail with supervision OT Short Term Goal 4 - Progress (Week 1): Met Week 2:  OT Short Term Goal 1 (Week 2): Pt will consistently sit to stand safely using his hands to push up to walker. OT Short Term Goal 2 (Week 2): Pt will be able to step in and out of tub with grab bars with supervision demonstrating improved balance and LE strength. OT Short Term Goal 3 (Week 2): Pt will demonstrate improved FMC by being able to tie shoes independently.  Skilled Therapeutic Interventions/Progress Updates:   Pt seen for BADL retraining of toileting, bathing at shower level, and dressing with a focus on family education. Today pt's wife, Windell Moulding,  took an active role in ambulating her husband with the RW to the toilet and then into and out of the shower.  She provided 100% of the supervision he needed this morning. She also cued him well when necessary for safe sit to stands.  Pt completed all self care at a supervision level. Pt then ambulated towards the ADL apartment and needed to rest after 150 feet.  He used his feet to propel himself the rest of the way in the wheelchair.  Pt worked on stepping in and out of the tub 3x with grab bar with supervision. Pt's wife stated that she would feel much safer with a tub bench.   He then went to therapy gym to work on RUE coordination and strength. His right grip strength was 25 and his left was 45 lbs.  He work with resistive clothespins, various grip strengthening exercises, and wrist coordination.  He also worked on sit to Programmer, systems from a low mat without UE support to increase LE strength. Pt needed min to steady assist with that movement. Pt taken back to room for his next therapy session.  Continue OT 5-7 days a week for 60- 90 min with Balance/vestibular training;Cognitive remediation/compensation;Discharge planning;Community reintegration;DME/adaptive equipment instruction;Functional mobility training;Pain management;Neuromuscular re-education;Patient/family education;Self Care/advanced ADL retraining;Therapeutic Activities;UE/LE Strength taining/ROM;Splinting/orthotics;Therapeutic Exercise;UE/LE Coordination activities;Visual/perceptual remediation/compensation to maximize his independence with self care.  Therapy Documentation Precautions:  Precautions Precautions: Fall Precaution Comments: decreased sitting and standing balance Restrictions  Weight Bearing Restrictions: No  Pain: Pain Assessment Pain Assessment: No/denies pain  ADL:  See FIM for current functional status  Therapy/Group: Individual Therapy  Cass Edinger 02/21/2013, 11:39 AM

## 2013-02-21 NOTE — Progress Notes (Addendum)
Physical Therapy Weekly Progress Note  Patient Details  Name: Jason Walker MRN: [id number] Date of Birth: 1930/05/03  Today's Date: 02/21/2013 Time: -4098-1191 individual   Patient has made good progress and has met 5 of 5 short term goals.  Patient is currently supervision overall for functional mobility with RW and demonstrates improved trunk control with decreased ataxia.   Patient continues to demonstrate the following deficits: LE weakness, impaired postural control with continued difficulty with R lateral weight shifting and balance reactions to prevent posterior LOB, dynamic standing balance, balance reactions, impaired gait and increased falls risk and therefore will continue to benefit from skilled PT intervention to enhance overall performance with balance, postural control, ability to compensate for deficits, functional use of  left lower extremity and coordination.  Patient progressing toward long term goals..  Continue plan of care.  PT Short Term Goals Week 2:   STGs = LTGs  Therapy Documentation Precautions:  Precautions Precautions: Fall Precaution Comments: decreased sitting and standing balance Restrictions Weight Bearing Restrictions: No General: Pt in bed upon arrival     Pain: No complaint of pain Pain Assessment Pain Assessment: No/denies pain Mobility: Bed mobility - SBA supine >< sit without rails X 2   Locomotion :Gait : 150 feet RW SBA X 2 with one standing rest break  Balance: Biodex postural control- pt able to maintain static standing position with minimal sway x 1 minute SBA; Weight shift- pt has difficulty with postural control (hip , ankle strategies ) with minimal weight shifts especially to right , anterior, or posterior without UE assist.  ; alternate stepping to 6 inch step without UE support required assist for balance (loss of balance > to right).    Exercises: Up/down 6 inch step RT LE X 10 with RW support for quad control/strengthening  (tactile cues to attain full terminal knee extension in stance on right)    See FIM for current functional status  Therapy/Group: Individual Therapy  HOUT,JIM 02/21/2013, 12:52 PM

## 2013-02-21 NOTE — Progress Notes (Signed)
Speech Language Pathology Daily Session Note & Weekly Progress Summary   Patient Details  Name: Jason Walker MRN: [id number] Date of Birth: 03-Jun-1930  Today's Date: 02/21/2013 Time: 4098-1191 Time Calculation (min): 40 min  Short Term Goals: Week 1: SLP Short Term Goal 1 (Week 1): Pt will demonstrate intellectual awareness by verbalizing two cog-ling deficits with moderate question cues.  SLP Short Term Goal 2 (Week 1): Pt will increase verbal output to multiple sentence level during communication with moderate multimodal cues and 75% accuracy.   SLP Short Term Goal 3 (Week 1): Pt will increase phonatory amplitude to accomodate across his hospital room communication without listener straining and minimal verba/visual cues with 75% of opportunities.   SLP Short Term Goal 4 (Week 1): Pt will increase speech comprehensibility to 75% at phrase/sentence level with moderate multimodal cues.   SLP Short Term Goal 5 (Week 1): Pt will demonstrate sustained attention to functional basic task for 7 minutes with min question cues.    Skilled Therapeutic Interventions: Skilled treatment session focused on addressing speech intelligibility and family education.  Patient was Mod I for speech intelligibility during sturctured and supervision level verbal cues to self monitor and correct during unstructured and divided attention conversational activities.  Wife present for session and demonstrated aprorpaite cuing techinques as well as vebralized strategies to improve speech such as modifiying environment.     FIM:  Comprehension Comprehension Mode: Auditory Comprehension: 4-Understands basic 75 - 89% of the time/requires cueing 10 - 24% of the time Expression Expression Mode: Verbal Expression: 5-Expresses basic 90% of the time/requires cueing < 10% of the time. Social Interaction Social Interaction: 5-Interacts appropriately 90% of the time - Needs monitoring or encouragement for participation or  interaction. Problem Solving Problem Solving: 3-Solves basic 50 - 74% of the time/requires cueing 25 - 49% of the time Memory Memory: 3-Recognizes or recalls 50 - 74% of the time/requires cueing 25 - 49% of the time  Pain Pain Assessment Pain Assessment: No/denies pain  Therapy/Group: Individual Therapy   Speech Language Pathology Weekly Progress Note  Patient Details  Name: Jason Walker MRN: [id number] Date of Birth: September 01, 1930  Today's Date: 02/21/2013  Short Term Goals: Week 1: SLP Short Term Goal 1 (Week 1): Pt will demonstrate intellectual awareness by verbalizing two cog-ling deficits with moderate question cues.  SLP Short Term Goal 1 - Progress (Week 1): Met SLP Short Term Goal 2 (Week 1): Pt will increase verbal output to multiple sentence level during communication with moderate multimodal cues and 75% accuracy.   SLP Short Term Goal 2 - Progress (Week 1): Met SLP Short Term Goal 3 (Week 1): Pt will increase phonatory amplitude to accomodate across his hospital room communication without listener straining and minimal verba/visual cues with 75% of opportunities.   SLP Short Term Goal 3 - Progress (Week 1): Met SLP Short Term Goal 4 (Week 1): Pt will increase speech comprehensibility to 75% at phrase/sentence level with moderate multimodal cues.   SLP Short Term Goal 4 - Progress (Week 1): Met SLP Short Term Goal 5 (Week 1): Pt will demonstrate sustained attention to functional basic task for 7 minutes with min question cues.   SLP Short Term Goal 5 - Progress (Week 1): Met Week 2: SLP Short Term Goal 1 (Week 2): Patient will demonstrate emergent awareness by requesting help with supervision level question cues.  SLP Short Term Goal 2 (Week 2): Patient will demonstrate selective attention to functional basic task for 10 minutes with supervision  level re-direction cues.   SLP Short Term Goal 3 (Week 2): Patient will self-monitor and correct vebral expression during divided  attention tasks with supervision level verbal cues.  Weekly Progress Updates: Patient met 5 out of 5 short term objectives this reporting period due to gains in intellectual awareness, sustained attention, recall of daily information and speech intelligibility.  Patient required mod-max assist for speech intelligibility and is requiring min assist without environmental modification.  As a result, it is recommended that this patient continue to receive skilled SLP services to address these deficits and reduce burden of care to supervision level assist prior to discharge home with wife.   SLP Intensity: Minumum of 1-2 x/day, 30 to 90 minutes SLP Frequency: 3 out of 7 days SLP Duration/Estimated Length of Stay: 1 week SLP Treatment/Interventions: Cognitive remediation/compensation;Cueing hierarchy;Patient/family education;Environmental controls;Multimodal communication approach;Speech/Language facilitation  Charlane Ferretti., CCC-SLP [phone]  Daiel Strohecker 02/21/2013, 11:22 AM

## 2013-02-21 NOTE — Plan of Care (Signed)
Problem: RH BOWEL ELIMINATION Goal: RH STG MANAGE BOWEL W/MEDICATION W/ASSISTANCE STG Manage Bowel with Medication with mod Assistance.  Outcome: Progressing Refused senokot today, LBM 3/27

## 2013-02-22 ENCOUNTER — Inpatient Hospital Stay (HOSPITAL_COMMUNITY): Payer: Medicare Other | Admitting: *Deleted

## 2013-02-22 DIAGNOSIS — I69993 Ataxia following unspecified cerebrovascular disease: Secondary | ICD-10-CM

## 2013-02-22 DIAGNOSIS — I6340 Cerebral infarction due to embolism of unspecified cerebral artery: Secondary | ICD-10-CM

## 2013-02-22 LAB — GLUCOSE, CAPILLARY
Glucose-Capillary: 120 mg/dL — ABNORMAL HIGH (ref 70–99)
Glucose-Capillary: 143 mg/dL — ABNORMAL HIGH (ref 70–99)
Glucose-Capillary: 191 mg/dL — ABNORMAL HIGH (ref 70–99)

## 2013-02-22 NOTE — Progress Notes (Signed)
Physical Therapy Note  Patient Details  Name: Jason Walker MRN: [id number] Date of Birth: [birth year] Today's Date: 02/22/2013  1300-1355 (55 minutes) group Pain: no reported pain Pt participated in PT group session focused on gait training/safety/endurance. Pt ambulates room >< gym 120 feet RW SBA . Continues to need vcs when navigating around obstacles for safe use of AD; Nustep Level 4 X 10 minutes for general activity tolerance; Standing balance- pt performed ball toss with close SBA ( no posterior loss of balance).    Titus Drone,JIM 02/22/2013, 1:57 PM

## 2013-02-22 NOTE — Progress Notes (Signed)
Patient ID: Jason Walker, male   DOB: [birth year], 79 y.o.   MRN: [id number] Subjective/Complaints: 79 y.o. male with history of DM, CAD, A-Fib but no anticoagulation due to life threating GIB, ICD; who was admitted to [facility] on 02/09/13 with N/V, fall and unresponsiveness. Initial CCT negative. [facility]. 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 Abdominal U/S done due to elevated amylase and CKD. This revealed 7 mm gallstone, no cholecystitis and atrophic left kidney. Carotid dopplers without ICA stenosis. 2D echo with EF 35% with diffuse hypokinesis and mild-moderate MVR. Neurology recommended continuing ASA/Plavix and close monitoring as at risk for hydrocephalus and increased ICP. Follow up CCT 03/18 with stable L-cerebellar infarct with extensive cytotoxic edema and mass effect  Slept well. No complaints today. Denies nausea.  No major complaints HOH A 12 point review of systems has been performed and if not noted above is otherwise negative.   Objective: Vital Signs: Blood pressure 129/68, pulse 83, temperature 97.9 F (36.6 C), temperature source Oral, resp. rate 18, height 5\' 5"  (1.651 m), weight 182 lb 1.6 oz (82.6 kg), SpO2 95.00%. nad Chest- cta cv-- irr irr rate/rhythm abd- soft, NT Neuro - alert  CBG (last 3)   Recent Labs  02/21/13 2040 02/22/13 0725 02/22/13 1131  GLUCAP 352* 120* 143*    Wt Readings from Last 3 Encounters:  02/22/13 182 lb 1.6 oz (82.6 kg)  02/14/13 188 lb 14.4 oz (85.684 kg)  02/04/13 192 lb (87.091 kg)    Physical Exam:  NAD Chest- cta cv- irreg rate abd- soft, nt  Assessment/Plan: 1. 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 DVT Prophylaxis/Anticoagulation: Pharmaceutical: Lovenox, plt low, cont to monitor, no sign of bleeding 2. Pain Management: on Depakote  500 g for headaches. Will decrease dose to 250 mg and change timing to bedtime.  3. Mood: Seems to be distracted with impaired awareness. Will have LCSW follow up for evaluation.  4. Neuropsych: This patient Is not capable of making decisions on his/her own behalf.  5. HTN: Monitor with bid checks. Borderline---no changes at present 6. CAD/Chronic systolic CHF/ICM with ICD: Check daily weights.  . Will titrated diuretics as needed to home dose.  7. CKD Stage IV: RUE graft placed last fall. Was on lasix 80 mg tid with FR [phone] cc/day. Baseline weight at 185-190 Lbs. Baseline Cr<3.0. --follow Daily weights and i's and o's 8. DM type 2: Hgb A1C @ 10. Monitor with AC/HS cbg checks9. Atrial Fibrillation with history of ventricular arrhthymias: monitor HR with bid checks. Continue coreg bid 10. Nausea: improved after bm's.   LOS (Days) 8 A FACE TO FACE EVALUATION WAS PERFORMED  SWORDS,BRUCE HENRY 02/22/2013 1:08 PM

## 2013-02-23 ENCOUNTER — Inpatient Hospital Stay (HOSPITAL_COMMUNITY): Payer: Medicare Other | Admitting: *Deleted

## 2013-02-23 LAB — GLUCOSE, CAPILLARY
Glucose-Capillary: 82 mg/dL (ref 70–99)
Glucose-Capillary: 143 mg/dL — ABNORMAL HIGH (ref 70–99)
Glucose-Capillary: 232 mg/dL — ABNORMAL HIGH (ref 70–99)

## 2013-02-23 NOTE — Progress Notes (Signed)
Physical Therapy Note  Patient Details  Name: Jason Walker MRN: [id number] Date of Birth: [birth year] Today's Date: 02/23/2013  1300-1355 (55 minutes) group Pain: no complaint of pain  Pt participated in PT group session focused on gait training/safety/endurance/standing balance. Pt ambulates 160 feet RW X 2 close SBA ; Standing balance (horseshoe toss) without UE support close SBA; gait without UE assist min assist with decreased step length bilaterally.  Ketzaly Cardella,JIM 02/23/2013, 1:48 PM

## 2013-02-23 NOTE — Progress Notes (Signed)
Patient ID: Jason Walker, male   DOB: 26-Mar-1930, 79 y.o.   MRN: [id number] Subjective/Complaints: 79 y.o. male with history of DM, CAD, A-Fib but no anticoagulation due to life threating GIB, ICD; who was admitted to [facility] on 02/09/13 with N/V, fall and unresponsiveness. Initial CCT negative. [facility]. 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 Abdominal U/S done due to elevated amylase and CKD. This revealed 7 mm gallstone, no cholecystitis and atrophic left kidney. Carotid dopplers without ICA stenosis. 2D echo with EF 35% with diffuse hypokinesis and mild-moderate MVR. Neurology recommended continuing ASA/Plavix and close monitoring as at risk for hydrocephalus and increased ICP. Follow up CCT 03/18 with stable L-cerebellar infarct with extensive cytotoxic edema and mass effect  Slept well. No complaints today. Denies nausea.  No major complaints    Objective: Vital Signs: Blood pressure 117/69, pulse 58, temperature 98.1 F (36.7 C), temperature source Oral, resp. rate 17, height 5\' 5"  (1.651 m), weight 180 lb 8.9 oz (81.9 kg), SpO2 95.00%. nad Chest- cta cv-- irr irr rate/rhythm abd- soft, NT Neuro - alert  CBG (last 3)   Recent Labs  02/22/13 1131 02/22/13 1647 02/22/13 2150  GLUCAP 143* 191* 235*    Wt Readings from Last 3 Encounters:  02/23/13 180 lb 8.9 oz (81.9 kg)  02/14/13 188 lb 14.4 oz (85.684 kg)  02/04/13 192 lb (87.091 kg)    Physical Exam:  NAD Chest- cta cv- irreg rate abd- soft, nt  Assessment/Plan: 1. 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 DVT Prophylaxis/Anticoagulation: Pharmaceutical: Lovenox, plt low, cont to monitor, no sign of bleeding 2. Pain Management: on Depakote  3. Mood: Seems to be distracted with impaired awareness. Will have LCSW follow up for evaluation.   4. Neuropsych: This patient Is not capable of making decisions on his/her own behalf.  5. HTN: Monitor with bid checks.-seems controlled  BP Readings from Last 3 Encounters:  02/23/13 117/69  02/14/13 146/68  02/04/13 130/83   6. CAD/Chronic systolic CHF/ICM with ICD: Check daily weights.  .  7. CKD Stage IV: RUE graft placed last fall.  8. DM type 2: Hgb A1C @ 10. Monitor with AC/HS cbg checks 9. Atrial Fibrillation with history of ventricular arrhthymias: monitor HR with bid checks. Continue coreg bid 10. Nausea: improved    LOS (Days) 9 A FACE TO FACE EVALUATION WAS PERFORMED  Jason Walker 02/23/2013 1:46 PM

## 2013-02-24 ENCOUNTER — Inpatient Hospital Stay (HOSPITAL_COMMUNITY): Payer: Medicare Other | Admitting: Speech Pathology

## 2013-02-24 ENCOUNTER — Inpatient Hospital Stay (HOSPITAL_COMMUNITY): Payer: Medicare Other | Admitting: Occupational Therapy

## 2013-02-24 ENCOUNTER — Inpatient Hospital Stay (HOSPITAL_COMMUNITY): Payer: Medicare Other

## 2013-02-24 ENCOUNTER — Inpatient Hospital Stay (HOSPITAL_COMMUNITY): Payer: Medicare Other | Admitting: *Deleted

## 2013-02-24 LAB — GLUCOSE, CAPILLARY

## 2013-02-24 NOTE — Progress Notes (Signed)
Occupational Therapy Session Note  Patient Details  Name: Jason Walker MRN: [id number] Date of Birth: 1930/07/05  Today's Date: 02/24/2013 Time: 0900-1000 and 4098-1191 Time Calculation (min): 60 min and 27 min  Short Term Goals: Week 1:  OT Short Term Goal 1 (Week 1): Pt will transfer to [facility] to with min A OT Short Term Goal 1 - Progress (Week 1): Met OT Short Term Goal 2 (Week 1): Pt will don LB clothing with min A OT Short Term Goal 2 - Progress (Week 1): Met OT Short Term Goal 3 (Week 1): Pt will perform sit to stand wtih steady A for clothing management OT Short Term Goal 3 - Progress (Week 1): Met OT Short Term Goal 4 (Week 1): Pt will perform bed mobility wtih rail with supervision OT Short Term Goal 4 - Progress (Week 1): Met Week 2:  OT Short Term Goal 1 (Week 2): Pt will consistently sit to stand safely using his hands to push up to walker. OT Short Term Goal 2 (Week 2): Pt will be able to step in and out of tub with grab bars with supervision demonstrating improved balance and LE strength. OT Short Term Goal 3 (Week 2): Pt will demonstrate improved FMC by being able to tie shoes independently.  Skilled Therapeutic Interventions/Progress Updates:     Visit 1:  Pt had already completed shower and dressing with daughter. Daughter reported that he completed all tasks with supervision.  Pt was seen in therapy for [facility] of right hand, standing balance, and activity balance.  Pt ambulated to gym with RW with supervision. He worked on several [facility] activities including purdue peg board, nuts and bolts. Pt stood at table for over 20 minutes to engage in a game of Connect 4.  He needed min cues on game strategy.  Pt completed all activities well and ambulated back to room to lay down in bed. Daughter in room with patient.  Visit 2: Pt seen this session to focus on activity tolerance and UE strengthening. Ambulated to gym with supervision with RW.   Pt worked on UBE for 12 min at level 3.  He  needed to take 3 short breaks. He then worked on dynamic standing balance activities on foam mat. Able to lift one leg at a time off mat with BUE support on parallel  Bars.  Grip strength remeasured:  Right 62lb (versus 45) and left 60lbs (versus 25 ). Pt's physical therapist arrived for his next session.      Therapy Documentation Precautions:  Precautions Precautions: Fall Precaution Comments: decreased sitting and standing balance Restrictions Weight Bearing Restrictions: No   Pain: Pain Assessment Pain Assessment: No/denies pain - no complaints in either session  ADL:  See FIM for current functional status  Therapy/Group: Individual Therapy  Fayetta Sorenson 02/24/2013, 12:20 PM

## 2013-02-24 NOTE — Progress Notes (Signed)
Speech Language Pathology Daily Session Note & Discharge Summary  Patient Details  Name: Jason Walker MRN: [id number] Date of Birth: 12-13-1929  Today's Date: 02/24/2013 Time: 1030-1100 Time Calculation (min): 30 min  Short Term Goals: Week 2: SLP Short Term Goal 1 (Week 2): Patient will demonstrate emergent awareness by requesting help with supervision level question cues.  SLP Short Term Goal 2 (Week 2): Patient will demonstrate selective attention to functional basic task for 10 minutes with supervision level re-direction cues.   SLP Short Term Goal 3 (Week 2): Patient will self-monitor and correct vebral expression during divided attention tasks with supervision level verbal cues.  Skilled Therapeutic Interventions: Skilled treatment session focused on addressing speech intelligibility and family education.  Patient was Mod I for speech intelligibility during unstructured conversational exchange with increased wait time x1 to self-monitor and correct.  Daughter present for session and SLP provided hand out regarding follow up oral motor exercises and speech intelligibility compensatory strategies for home program.  No follow up is recommended at this time; patient has assist needed at discharge and family in agreement.      FIM:  Comprehension Comprehension Mode: Auditory Comprehension: 5-Follows basic conversation/direction: With no assist Expression Expression Mode: Verbal Expression: 5-Expresses basic needs/ideas: With extra time/assistive device Social Interaction Social Interaction: 5-Interacts appropriately 90% of the time - Needs monitoring or encouragement for participation or interaction. Problem Solving Problem Solving: 5-Solves basic 90% of the time/requires cueing < 10% of the time Memory Memory: 5-Recognizes or recalls 90% of the time/requires cueing < 10% of the time  Pain Pain Assessment Pain Assessment: No/denies pain Pain Score: 0-No pain  Therapy/Group:  Individual Therapy   Speech Language Pathology Discharge Summary  Patient Details  Name: Jason Walker MRN: [id number] Date of Birth: [birth year]  Today's Date: 02/24/2013  Patient has met 5 of 5 long term goals.  Patient to discharge at overall Supervision level.  Reasons goals not met: n/a   Clinical Impression/Discharge Summary: Patient met 5 out of 5 long term goals this reporting period due to gains in cognition and speech intelligibility.  Patient requires supervision level assist at discharge for recall, problem solving and awareness as well as intermittent cues to self-monitor and correct speech intelligibility due to his premorbid hearing loss.  Family education has been completed and no follow up services are recommended at this time, as patient has level of assist needed at discharge.    Care Partner:  Caregiver Able to Provide Assistance: Yes  Type of Caregiver Assistance: Cognitive  Recommendation:  None;24 hour supervision/assistance      Equipment: none   Reasons for discharge: Treatment goals met   Patient/Family Agrees with Progress Made and Goals Achieved: Yes   See FIM for current functional status  Charlane Ferretti., CCC-SLP [phone]  [name] 02/24/2013, 4:07 PM

## 2013-02-24 NOTE — Progress Notes (Signed)
Social Work Patient ID: Jason Walker, male   DOB: [birth year], 79 y.o.   MRN: [id number] MD feels pt medically ready for discharge tomorrow and team agrees.  Pt and wife are agreeable and pleased with his progress here. Will order DME and home health arranged vai [facility], pt's preference.  Pt requires a lightweight wheelchair due to unable to self propel an Standard wheelchair, he also uses for self care.

## 2013-02-24 NOTE — Progress Notes (Signed)
Physical Therapy Discharge Summary  Patient Details  Name: Mostafa Yuan MRN: [id number] Date of Birth: [birth year]  Today's Date: 02/24/2013 Time: 4098-1191 Time Calculation (min): 40 min  Patient has met 13 of 13 long term goals due to improved activity tolerance, improved balance, improved postural control, increased strength, ability to compensate for deficits, improved attention, improved awareness and improved coordination.  Patient to discharge at an ambulatory level Supervision.   Patient's care partner is independent to provide the necessary cognitive assistance at discharge.  Reasons goals not met: N/A, all LTGs met.  Recommendation:  Patient will benefit from ongoing skilled PT services in home health setting to continue to advance safe functional mobility, address ongoing impairments in strength, activity tolerance, balance, coordination, postural control, overall functional mobility, and minimize fall risk.  Equipment: rolling walker, wheelchair, and cushion  Reasons for discharge: treatment goals met and discharge from hospital  Patient/family agrees with progress made and goals achieved: Yes  PT Discharge Precautions/Restrictions Restrictions Weight Bearing Restrictions: No Pain Pain Assessment Pain Assessment: No/denies pain Pain Score: 0-No pain Vision/Perception  Vision - History Patient Visual Report: No change from baseline Vision - Assessment Eye Alignment: Within Functional Limits Ocular Range of Motion: Within Functional Limits Alignment/Gaze Preference: Within Defined Limits Perception Perception: Impaired Spatial Orientation: min impaired Praxis Praxis: Intact  Cognition Overall Cognitive Status: Appears within functional limits for tasks assessed Arousal/Alertness: Awake/alert Orientation Level: Oriented X4 Memory: Impaired Memory Impairment: Decreased long term memory;Decreased short term memory;Decreased recall of new information;Storage  deficit;Retrieval deficit;Prospective memory Sensation Sensation Light Touch: Appears Intact Proprioception: Appears Intact Additional Comments: Proprioception intact at B ankles and great toes. Coordination Gross Motor Movements are Fluid and Coordinated: Yes Fine Motor Movements are Fluid and Coordinated: No Coordination and Movement Description: Decreased speed and accuracy with rapid alternating movements. Heel Shin Test: Able to complete bilaterally, noted slight dysmetria on R LE Motor  Motor Motor - Discharge Observations: LE weakness, truncal ataxia resolved  Mobility Bed Mobility Bed Mobility: Supine to Sit;Sit to Supine Supine to Sit: 5: Supervision;HOB flat Supine to Sit Details: Verbal cues for precautions/safety Sit to Supine: 5: Supervision;HOB flat Sit to Supine - Details: Verbal cues for precautions/safety Transfers Sit to Stand: 5: Supervision;From bed;With armrests;With upper extremity assist;From chair/3-in-1 Sit to Stand Details: Verbal cues for sequencing;Verbal cues for precautions/safety Stand to Sit: 5: Supervision;To bed;To chair/3-in-1;With upper extremity assist;With armrests Stand to Sit Details (indicate cue type and reason): Verbal cues for sequencing;Verbal cues for precautions/safety Stand Pivot Transfers: 5: Supervision Stand Pivot Transfer Details: Verbal cues for precautions/safety Locomotion  Ambulation Ambulation: Yes Ambulation/Gait Assistance: 5: Supervision Ambulation Distance (Feet): 230 Feetx1, 160' x1, 152' x1 (in controlled, home, and community environments) Assistive device: Agricultural consultant Ambulation/Gait Assistance Details: Verbal cues for precautions/safety;Verbal cues for safe use of DME/AE Ambulation/Gait Assistance Details: Patient instructed in gait training 230' x1, 160' x1 in community environment with rolling walker and supervision. Gait Gait: Yes Gait Pattern: Decreased stride length;Shuffle;Step-through pattern Stairs /  Additional Locomotion Stairs: Yes Stairs Assistance: 5: Supervision Stairs Assistance Details: Verbal cues for precautions/safety Stair Management Technique: One rail Right;Alternating pattern;Forwards Number of Stairs: 10 Height of Stairs: 7 Ramp: 5: Supervision Curb: 5: Programme researcher, broadcasting/film/video: Yes Wheelchair Assistance: 5: Financial planner Details: Verbal cues for precautions/safety (for negotiation of obstacles in community/home environment) Occupational hygienist: Both lower extermities Wheelchair Parts Management: Needs assistance Distance: 150  Trunk/Postural Assessment  Cervical Assessment Cervical Assessment: Within Functional Limits Thoracic Assessment Thoracic Assessment: Within Functional Limits  Lumbar Assessment Lumbar Assessment: Within Functional Limits Postural Control Trunk Control: minimally impaired  Balance Balance Balance Assessed: Yes Static Sitting Balance Static Sitting - Balance Support: Feet supported;No upper extremity supported Static Sitting - Level of Assistance: 7: Independent Static Standing Balance Static Standing - Balance Support: Bilateral upper extremity supported;During functional activity Static Standing - Level of Assistance: 5: Stand by assistance Dynamic Standing Balance Dynamic Standing - Balance Support: Bilateral upper extremity supported;During functional activity Dynamic Standing - Level of Assistance: 5: Stand by assistance Extremity Assessment  RLE Assessment RLE Assessment: Exceptions to [facility] RLE Strength RLE Overall Strength: Within Functional Limits for tasks assessed RLE Overall Strength Comments: Grossly 4/5 LLE Assessment LLE Assessment: Within Functional Limits LLE Strength LLE Overall Strength: Within Functional Limits for tasks assessed LLE Overall Strength Comments: Grossly 4/5  See FIM for current functional status  Aviah Sorci S Jackey Loge. Josely Moffat, PT,  DPT  02/24/2013, 3:46 PM

## 2013-02-24 NOTE — Progress Notes (Signed)
PPatient ID: Jason Walker, male   DOB: Mar 12, 1930, 79 y.o.   MRN: [id number] Subjective/Complaints: 79 y.o. male with history of DM, CAD, A-Fib but no anticoagulation due to life threating GIB, ICD; who was admitted to [facility] on 02/09/13 with N/V, fall and unresponsiveness. Initial CCT negative. [facility] 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 Abdominal U/S done due to elevated amylase and CKD. This revealed 7 mm gallstone, no cholecystitis and atrophic left kidney. Carotid dopplers without ICA stenosis. 2D echo with EF 35% with diffuse hypokinesis and mild-moderate MVR. Neurology recommended continuing ASA/Plavix and close monitoring as at risk for hydrocephalus and increased ICP. Follow up CCT 03/18 with stable L-cerebellar infarct with extensive cytotoxic edema and mass effect  Slept well. No complaints today. Denies nausea.  No major complaints HOH A 12 point review of systems has been performed and if not noted above is otherwise negative.   Objective: Vital Signs: Blood pressure 115/75, pulse 86, temperature 97.8 F (36.6 C), temperature source Oral, resp. rate 18, height 5\' 5"  (1.651 m), weight 81.2 kg (179 lb 0.2 oz), SpO2 98.00%. No results found. No results found for this basename: WBC, HGB, HCT, PLT,  in the last 72 hours No results found for this basename: NA, K, CL, CO, GLUCOSE, BUN, CREATININE, CALCIUM,  in the last 72 hours CBG (last 3)   Recent Labs  02/23/13 1651 02/23/13 2033 02/24/13 0711  GLUCAP 143* 232* 118*    Wt Readings from Last 3 Encounters:  02/24/13 81.2 kg (179 lb 0.2 oz)  02/14/13 85.684 kg (188 lb 14.4 oz)  02/04/13 87.091 kg (192 lb)    Physical Exam:  Constitutional: He is oriented to person, place, and time. He appears well-developed and well-nourished. Marland Kitchen He is Alert    HENT:  Head: Normocephalic and atraumatic.  Left Ear:  External ear normal.  Eyes: Conjunctivae and EOM are normal. Pupils are equal, round, and reactive to light.  Neck: Normal range of motion. No JVD present. No tracheal deviation present. No thyromegaly present.  Cardiovascular: Normal rate. An irregularly irregular rhythm present. Exam reveals no gallop and no friction rub.  No murmur heard.  Pulmonary/Chest: Effort normal and breath sounds normal. No respiratory distress. He has no wheezes. He has no rales. He exhibits no tenderness.  Abdominal: bell soft, NT. Bowel sounds positive.  Musculoskeletal: He exhibits tenderness. He exhibits no edema.  Lymphadenopathy:  He has no cervical adenopathy.  Neurological: He is oriented to person, place, and time and easily aroused. He appears lethargic.  HOH. Speech with mild/moderate dysarthria. Left field cut with inattention. Impaired attention with decreased insight and poor awareness of deficits.persistent ataxia LUE and decrease in fine motor control LLE. Right arm limited at shoulder due to RTC issue. Strength grossly 4/5 except for pain inhibition.  Skin: Skin is warm and dry.  Psychiatric:  Fair insight and awareness.  .    Assessment/Plan: 1. Functional deficits secondary to thrombotic left cerebellar infarct which require 3+ hours per day of interdisciplinary therapy in a comprehensive inpatient rehab setting. Pt scheduled for D/C on 4/2, would like to D/C 4/1 .  Medically ready for 4/1 if family training completed. FIM: FIM - Bathing Bathing Steps Patient Completed: Chest;Right Arm;Left Arm;Abdomen;Front perineal area;Buttocks;Right upper leg;Left upper leg;Right lower leg (including foot);Left lower leg (including foot) Bathing: 5: Supervision: Safety issues/verbal cues  FIM - Upper Body  Dressing/Undressing Upper body dressing/undressing steps patient completed: Put head through opening of pull over shirt/dress;Thread/unthread left sleeve of pullover shirt/dress;Thread/unthread right  sleeve of pullover shirt/dresss;Pull shirt over trunk Upper body dressing/undressing: 5: Set-up assist to: Obtain clothing/put away FIM - Lower Body Dressing/Undressing Lower body dressing/undressing steps patient completed: Thread/unthread right underwear leg;Thread/unthread left underwear leg;Pull underwear up/down;Thread/unthread right pants leg;Thread/unthread left pants leg;Pull pants up/down;Don/Doff left sock;Don/Doff right shoe;Don/Doff right sock;Don/Doff left shoe;Fasten/unfasten left shoe;Fasten/unfasten right shoe Lower body dressing/undressing: 5: Supervision: Safety issues/verbal cues  FIM - Toileting Toileting steps completed by patient: Adjust clothing prior to toileting;Performs perineal hygiene;Adjust clothing after toileting Toileting Assistive Devices: Grab bar or rail for support Toileting: 5: Supervision: Safety issues/verbal cues  FIM - Diplomatic Services operational officer Devices: Best boy Transfers: 5-To toilet/BSC: Supervision (verbal cues/safety issues);5-From toilet/BSC: Supervision (verbal cues/safety issues)  FIM - Banker Devices: Walker;Arm rests;HOB elevated;Bed rails Bed/Chair Transfer: 5: Sit > Supine: Supervision (verbal cues/safety issues);5: Bed > Chair or W/C: Supervision (verbal cues/safety issues);5: Chair or W/C > Bed: Supervision (verbal cues/safety issues)  FIM - Locomotion: Wheelchair Distance: 150 Locomotion: Wheelchair: 5: Travels 150 ft or more: maneuvers on rugs and over door sills with supervision, cueing or coaxing FIM - Locomotion: Ambulation Locomotion: Ambulation Assistive Devices: Designer, industrial/product Ambulation/Gait Assistance: 4: Min guard Locomotion: Ambulation: 4: Travels 150 ft or more with minimal assistance (Pt.>75%)  Comprehension Comprehension Mode: Auditory Comprehension: 5-Follows basic conversation/direction: With no assist  Expression Expression Mode:  Verbal Expression: 5-Expresses basic 90% of the time/requires cueing < 10% of the time.  Social Interaction Social Interaction: 5-Interacts appropriately 90% of the time - Needs monitoring or encouragement for participation or interaction.  Problem Solving Problem Solving: 3-Solves basic 50 - 74% of the time/requires cueing 25 - 49% of the time  Memory Memory: 3-Recognizes or recalls 50 - 74% of the time/requires cueing 25 - 49% of the time Medical Problem List and Plan:  1. DVT Prophylaxis/Anticoagulation: Pharmaceutical: Lovenox, plt low, cont to monitor, no sign of bleeding 2. Pain Management: on Depakote 500 g for headaches. Will decrease dose to 250 mg and change timing to bedtime.  3. Mood: Seems to be distracted with impaired awareness. Will have LCSW follow up for evaluation.  4. Neuropsych: This patient Is not capable of making decisions on his/her own behalf.  5. HTN: Monitor with bid checks. Borderline---no changes at present 6. CAD/Chronic systolic CHF/ICM with ICD: Check daily weights.  . Will titrated diuretics as needed to home dose.  7. CKD Stage IV: RUE graft placed last fall. Was on lasix 80 mg tid with FR [phone] cc/day. Baseline weight at 185-190 Lbs. Baseline Cr<3.0. --follow Daily weights and i's and o's 8. DM type 2: Hgb A1C @ 10. Monitor with AC/HS cbg checks. Po intake poor at this time.Increase lantus 25 units--was on 35 units at home.. Will continue SSI for elevated BS.   -fair control still.  9. Atrial Fibrillation with history of ventricular arrhthymias: monitor HR with bid checks. Continue coreg bid 10. Nausea: improved after bm's.   LOS (Days) 10 A FACE TO FACE EVALUATION WAS PERFORMED  Erick Colace 02/24/2013 8:33 AM

## 2013-02-24 NOTE — Progress Notes (Signed)
Physical Therapy Session Note  Patient Details  Name: Deundre Thong MRN: [id number] Date of Birth: 28-Feb-1930  Today's Date: 02/24/2013 Time: 1132-1202 Time Calculation (min): 30 min  Short Term Goals: Week 1:  PT Short Term Goal 1 (Week 1): Patient will perform bed mobility with min assist. PT Short Term Goal 2 (Week 1): Patient will perform stand pivot transfers with LRAD and min assist. PT Short Term Goal 3 (Week 1): Patient will ambulate 87' with LRAD and min assist. PT Short Term Goal 4 (Week 1): Patient will negotiate 5 steps with B handrails and min assist.    Skilled Therapeutic Interventions/Progress Updates:  neuromuscular re-education via demo, VCs, for: - wt shifting forward/backward in standing to facilitate balance reactions -R and L stance stability using hip abductors in closed chain technique -mini squats with = wt bearing -alternating step/taps onto 4" high bench, without UE support, focusing on sufficient hip flexion to avoid dragging feet, and wt shift to L to allow RLe movement  Gait training x 150' with Rw, supervision.    Therapy Documentation Precautions:  Precautions Precautions: Fall   Pain: Pain Assessment Pain Assessment: No/denies pain   Locomotion : Ambulation Ambulation: Yes Ambulation/Gait Assistance: 5: Supervision Gait Gait: Yes Stairs / Additional Locomotion Stairs: Yes Stairs Assistance: 5: Supervision Stair Management Technique: One rail Right;Alternating pattern Number of Stairs: 5 Height of Stairs: 7 Ramp: 5: Supervision Curb: 5: Supervision    See FIM for current functional status  Therapy/Group: Individual Therapy  Ashiah Karpowicz 02/24/2013, 12:08 PM

## 2013-02-24 NOTE — Progress Notes (Signed)
Occupational Therapy Discharge Summary  Patient Details  Name: Lamari Beckles MRN: [id number] Date of Birth: [birth year]  Today's Date: 02/24/2013  Patient has met 10 of 10 long term goals due to improved activity tolerance, improved balance, postural control, ability to compensate for deficits, functional use of  RIGHT upper extremity, improved attention, improved awareness and improved coordination.  Patient to discharge at overall Supervision level.  Patient's care partner is independent to provide the necessary physical and cognitive assistance at discharge.    Reasons goals not met: n/a  Recommendation:  Patient will benefit from ongoing skilled OT services in home health setting to continue to advance functional skills in the area of BADL.  Equipment: tub bench  Reasons for discharge: treatment goals met  Patient/family agrees with progress made and goals achieved: Yes  OT Discharge ADL  Supervision Vision/Perception  Vision - History Patient Visual Report: No change from baseline Vision - Assessment Eye Alignment: Within Functional Limits Ocular Range of Motion: Within Functional Limits Alignment/Gaze Preference: Within Defined Limits Perception Perception: Impaired Spatial Orientation: min impaired Praxis Praxis: Intact  Cognition Overall Cognitive Status: Impaired Memory: Impaired Memory Impairment: Decreased long term memory;Decreased short term memory;Decreased recall of new information;Storage deficit;Retrieval deficit;Prospective memory Sensation Sensation Light Touch: Appears Intact Stereognosis: Appears Intact Hot/Cold: Appears Intact Proprioception: Appears Intact Coordination Gross Motor Movements are Fluid and Coordinated: Yes Fine Motor Movements are Fluid and Coordinated: Yes Motor  Motor Motor - Discharge Observations: LE weakness, truncal ataxia resolved Mobility    supervision Trunk/Postural Assessment  Cervical Assessment Cervical Assessment:  Within Functional Limits Thoracic Assessment Thoracic Assessment: Within Functional Limits Lumbar Assessment Lumbar Assessment: Within Functional Limits Postural Control Trunk Control: minimally impaired  Balance Static Sitting Balance Static Sitting - Level of Assistance: 7: Independent Static Standing Balance Static Standing - Level of Assistance: 5: Stand by assistance Dynamic Standing Balance Dynamic Standing - Level of Assistance: 5: Stand by assistance Extremity/Trunk Assessment RUE Assessment RUE Assessment: Within Functional Limits LUE Assessment LUE Assessment: Within Functional Limits  See FIM for current functional status  SAGUIER,JULIA 02/24/2013, 12:33 PM

## 2013-02-24 NOTE — Progress Notes (Addendum)
Called to room by Patient's wife, pt up to bathroom, became nauseated, vomited, return to bed. After emesis episode, patient denies continuing to be nauseated, declines offer of medication for nausea, wife stated "did it last night too. Patient states I 'm ok now". Declines offer of diet ginger ale. Roberts-VonCannon, Marji Kuehnel Elon Jester

## 2013-02-25 DIAGNOSIS — I6340 Cerebral infarction due to embolism of unspecified cerebral artery: Secondary | ICD-10-CM

## 2013-02-25 DIAGNOSIS — I69993 Ataxia following unspecified cerebrovascular disease: Secondary | ICD-10-CM

## 2013-02-25 LAB — GLUCOSE, CAPILLARY

## 2013-02-25 MED ORDER — FUROSEMIDE 40 MG PO TABS: 40.0000 mg | ORAL_TABLET | Freq: Two times a day (BID) | ORAL | Status: DC

## 2013-02-25 MED ORDER — INSULIN GLARGINE 100 UNIT/ML ~~LOC~~ SOLN: 30.0000 [IU] | Freq: Every day | SUBCUTANEOUS | Status: DC

## 2013-02-25 NOTE — Progress Notes (Signed)
Social Work Discharge Note Discharge Note  The overall goal for the admission was met for:   Discharge location: Yes-HOME WITH WIFE WHO CAN PROVIDE SUPERVISION LEVEL  Length of Stay: Yes-11 DAYS  Discharge activity level: Yes-SUPERVISION LEVEL  Home/community participation: Yes  Services provided included: MD, RD, PT, OT, SLP, RN, Pharmacy and SW  Financial Services: Medicare and Private Insurance: [employer]  Follow-up services arranged: Home Health: [facility]-PT,RN, DME: [facility]-WHEELCHAIR, ROLLING WALKER, TUB BENCH and Patient/Family request agency [facility]: [facility], DME: [facility]  Comments (or additional information):PT REACHED GOALS SOONER THAN ANTICIPATED AND READY FOR DISCHARGE DAY EARILER  Patient/Family verbalized understanding of follow-up arrangements: Yes  Individual responsible for coordination of the follow-up plan: [place]-WIFE  Confirmed correct DME delivered: Lucy Chris 02/25/2013    Lucy Chris

## 2013-02-25 NOTE — Progress Notes (Signed)
Patient ID: Jason Walker, male   DOB: Jan 17, 1930, 79 y.o.   MRN: [id number] Subjective/Complaints: 79 y.o. male with history of DM, CAD, A-Fib but no anticoagulation due to life threating GIB, ICD; who was admitted to [facility] on 02/09/13 with N/V, fall and unresponsiveness. Initial CCT negative. [facility]. 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 Abdominal U/S done due to elevated amylase and CKD. This revealed 7 mm gallstone, no cholecystitis and atrophic left kidney. Carotid dopplers without ICA stenosis. 2D echo with EF 35% with diffuse hypokinesis and mild-moderate MVR. Neurology recommended continuing ASA/Plavix and close monitoring as at risk for hydrocephalus and increased ICP. Follow up CCT 03/18 with stable L-cerebellar infarct with extensive cytotoxic edema and mass effect  Slept well. No complaints today. Denies nausea.  No major complaints HOH A 12 point review of systems has been performed and if not noted above is otherwise negative.   Objective: Vital Signs: Blood pressure 117/50, pulse 69, temperature 97.9 F (36.6 C), temperature source Oral, resp. rate 19, height 5\' 5"  (1.651 m), weight 80.5 kg (177 lb 7.5 oz), SpO2 98.00%. No results found. No results found for this basename: WBC, HGB, HCT, PLT,  in the last 72 hours No results found for this basename: NA, K, CL, CO, GLUCOSE, BUN, CREATININE, CALCIUM,  in the last 72 hours CBG (last 3)   Recent Labs  02/24/13 1650 02/24/13 2106 02/25/13 0727  GLUCAP 211* 154* 106*    Wt Readings from Last 3 Encounters:  02/25/13 80.5 kg (177 lb 7.5 oz)  02/14/13 85.684 kg (188 lb 14.4 oz)  02/04/13 87.091 kg (192 lb)    Physical Exam:  Constitutional: He is oriented to person, place, and time. He appears well-developed and well-nourished. Marland Kitchen He is Alert    HENT:  Head: Normocephalic and atraumatic.  Left Ear:  External ear normal.  Eyes: Conjunctivae and EOM are normal. Pupils are equal, round, and reactive to light.  Neck: Normal range of motion. No JVD present. No tracheal deviation present. No thyromegaly present.  Cardiovascular: Normal rate. An irregularly irregular rhythm present. Exam reveals no gallop and no friction rub.  No murmur heard.  Pulmonary/Chest: Effort normal and breath sounds normal. No respiratory distress. He has no wheezes. He has no rales. He exhibits no tenderness.  Abdominal: bell soft, NT. Bowel sounds positive.  Musculoskeletal: He exhibits tenderness. He exhibits no edema.  Lymphadenopathy:  He has no cervical adenopathy.  Neurological: He is oriented to person, place, and time and easily aroused. He appears lethargic.  HOH. Speech with mild/moderate dysarthria. Left field cut with inattention. Impaired attention with decreased insight and poor awareness of deficits.persistent ataxia LUE and decrease in fine motor control LLE. Right arm limited at shoulder due to RTC issue. Strength grossly 4/5  Skin: Skin is warm and dry.  Psychiatric:  Fair insight and awareness.  .    Assessment/Plan: 1. Functional deficits secondary to thrombotic left cerebellar infarct  Stable for D/C today F/u PCP in 1-2 weeks F/u PM&R 3 weeks See D/C summary See D/C instructions FIM: FIM - Bathing Bathing Steps Patient Completed: Chest;Right Arm;Left Arm;Abdomen;Front perineal area;Buttocks;Right upper leg;Left upper leg;Right lower leg (including foot);Left lower leg (including foot) Bathing: 5: Supervision: Safety issues/verbal cues  FIM - Upper Body Dressing/Undressing Upper body dressing/undressing steps patient completed: Put head through opening of pull over shirt/dress;Thread/unthread left sleeve of pullover shirt/dress;Thread/unthread  right sleeve of pullover shirt/dresss;Pull shirt over trunk Upper body dressing/undressing: 5: Set-up assist to: Obtain clothing/put away FIM -  Lower Body Dressing/Undressing Lower body dressing/undressing steps patient completed: Thread/unthread right underwear leg;Thread/unthread left underwear leg;Pull underwear up/down;Thread/unthread right pants leg;Thread/unthread left pants leg;Pull pants up/down;Don/Doff left sock;Don/Doff right shoe;Don/Doff right sock;Don/Doff left shoe;Fasten/unfasten left shoe;Fasten/unfasten right shoe Lower body dressing/undressing: 5: Supervision: Safety issues/verbal cues  FIM - Toileting Toileting steps completed by patient: Adjust clothing prior to toileting;Performs perineal hygiene;Adjust clothing after toileting Toileting Assistive Devices: Grab bar or rail for support Toileting: 5: Supervision: Safety issues/verbal cues  FIM - Diplomatic Services operational officer Devices: Best boy Transfers: 5-To toilet/BSC: Supervision (verbal cues/safety issues);5-From toilet/BSC: Supervision (verbal cues/safety issues)  FIM - Press photographer Assistive Devices: Arm rests;Walker Bed/Chair Transfer: 5: Chair or W/C > Bed: Supervision (verbal cues/safety issues);5: Bed > Chair or W/C: Supervision (verbal cues/safety issues);5: Sit > Supine: Supervision (verbal cues/safety issues);5: Supine > Sit: Supervision (verbal cues/safety issues)  FIM - Locomotion: Wheelchair Distance: 150 Locomotion: Wheelchair: 5: Travels 150 ft or more: maneuvers on rugs and over door sills with supervision, cueing or coaxing FIM - Locomotion: Ambulation Locomotion: Ambulation Assistive Devices: Designer, industrial/product Ambulation/Gait Assistance: 5: Supervision Locomotion: Ambulation: 5: Travels 150 ft or more with supervision/safety issues  Comprehension Comprehension Mode: Auditory Comprehension: 5-Follows basic conversation/direction: With no assist  Expression Expression Mode: Verbal Expression: 5-Expresses basic needs/ideas: With extra time/assistive device  Social Interaction Social  Interaction: 5-Interacts appropriately 90% of the time - Needs monitoring or encouragement for participation or interaction.  Problem Solving Problem Solving: 5-Solves basic 90% of the time/requires cueing < 10% of the time  Memory Memory: 5-Recognizes or recalls 90% of the time/requires cueing < 10% of the time Medical Problem List and Plan:  1. DVT Prophylaxis/Anticoagulation: Pharmaceutical: Lovenox, plt low, cont to monitor, no sign of bleeding 2. Pain Management: on Depakote 500 g for headaches. Will decrease dose to 250 mg and change timing to bedtime.  3. Mood: Seems to be distracted with impaired awareness. Will have LCSW follow up for evaluation.  4. Neuropsych: This patient Is not capable of making decisions on his/her own behalf.  5. HTN: Monitor with bid checks. Borderline---no changes at present 6. CAD/Chronic systolic CHF/ICM with ICD: Check daily weights.  . Will titrated diuretics as needed to home dose. F/u cardiology Dr Ladona Ridgel 7. CKD Stage IV: RUE graft placed last fall. Stable f/u renal 8. DM type 2: Hgb A1C @ 10. Monitor with AC/HS cbg checks. Stable f/u PCP   9. Atrial Fibrillation with history of ventricular arrhthymias: monitor HR with bid checks. Continue coreg bid    LOS (Days) 11 A FACE TO FACE EVALUATION WAS PERFORMED  Erick Colace 02/25/2013 7:52 AM

## 2013-02-25 NOTE — Discharge Summary (Signed)
Physician Discharge Summary  Patient ID: Jason Walker MRN: [id number] DOB/AGE: [birth year] 79 y.o.  Admit date: 02/14/2013 Discharge date: 02/25/2013  Discharge Diagnoses:  Principal Problem:   CVA (cerebral infarction) Active Problems:   DM   CAD   Chronic systolic heart failure   AUTOMATIC IMPLANTABLE CARDIAC DEFIBRILLATOR SITU   Atrial fibrillation   CKD (chronic kidney disease) stage 4, GFR 15-29 ml/min Thrombocytopenia.   Discharged Condition: Good    Labs:  Basic Metabolic Panel:  Recent Labs Lab 02/20/13 0518  NA 134*  K 3.9  CL 96  CO2 31  GLUCOSE 162*  BUN 30*  CREATININE 1.98*  CALCIUM 9.0    CBC: No results found for this basename: WBC, NEUTROABS, HGB, HCT, MCV, PLT,  in the last 168 hours  CBG:  Recent Labs Lab 02/24/13 1347 02/24/13 1650 02/24/13 2106 02/25/13 0727 02/25/13 1130  GLUCAP 231* 211* 154* 106* 153*    HPI:   Jason Walker is a 79 y.o. male with history of DM, CAD, A-Fib but no anticoagulation due to life threating GIB, ICD; who was admitted to [facility] on 02/09/13 with N/V, fall and unresponsiveness. Initial CCT negative. [facility] 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 Abdominal U/S done due to elevated amylase and CKD. This revealed 7 mm gallstone, no cholecystitis and atrophic left kidneyNeurology recommended continuing ASA/Plavix and close monitoring as at risk for hydrocephalus and increased ICP. Follow up CCT 03/18 with stable L-cerebellar infarct with extensive cytotoxic edema and mass effect. Patient continues Dizziness with balance and gaze disturbances but mentation improving. Therapy team recommended CIR and patient admitted on 02/14/13.    Hospital Course: Jason Walker was admitted to rehab 02/14/2013 for inpatient therapies to consist of PT, ST and OT at least three hours five days a week. Past admission  physiatrist, therapy team and rehab RN have worked together to provide customized collaborative inpatient rehab. Due to history of CHF, daily weights with well as salt restrictions was  maintained. 1000 cc Fluid restriction was added as wife relayed nephrology recommendations. Routine check of lytes were done. Weight at time of discharge is at 80.5 kg and renal status has greatly improved. No signs of overload noted. Patient's diabetes was monitored with ac/hs cbg checks and lantus titrated to 30 units. Patient to continue on home dose SSI past discharge. Po intake has been good. He has been continent of B/B. CBC shows improvement in H/H to 12.2/ 35.7 . Thrombocytopenia noted with platelets at 115. His family has been supportive and present for multiple therapy sessions. Family to provide assistance as needed past discharge as patient    Rehab Course: During patient's stay in rehab weekly team conferences were held to monitor patient's progress, set goals and discuss barriers to discharge. Speech therapy has worked with patient on emergent awareness as well as attention and expression. Patient was Mod I for speech intelligibility during unstructured conversational exchange with increased wait time x1 to self-monitor and correct. PT/OT has worked on activity with focus on balance,strenghtening and coordination. He was supervision for mobility. He was able to complete ADL tasks at supervision. Family education was done with wife and follow up HH therapies to continue past discharge.   Disposition: 01-Home or Self Care       Future Appointments Provider Department Dept Phone   03/17/2013 10:00 AM Marinus Maw, MD [facility]  [phone]   03/21/2013 1:30 PM Erick Colace, [facility] [phone]   05/12/2013 9:05 AM [facility] Main Office [place]) [phone]       Medication List    STOP taking these medications        divalproex 500 MG 24 hr tablet  Commonly known as:  DEPAKOTE ER     lisinopril 40 MG tablet  Commonly known as:  PRINIVIL,ZESTRIL      TAKE these medications       aspirin 81 MG tablet  Take 81 mg by mouth daily.     carvedilol 12.5 MG tablet  Commonly known as:  COREG  Take 1 tablet (12.5 mg total) by mouth 2 (two) times daily.     cholecalciferol 1000 UNITS tablet  Commonly known as:  VITAMIN D  Take 1,000 Units by mouth daily.     clopidogrel 75 MG tablet  Commonly known as:  PLAVIX  Take 75 mg by mouth daily.     doxazosin 4 MG tablet  Commonly known as:  CARDURA  Take 4 mg by mouth at bedtime.     furosemide 40 MG tablet  Commonly known as:  LASIX  Take 1 tablet (40 mg total) by mouth 2 (two) times daily with breakfast and lunch.     insulin aspart 100 UNIT/ML injection  Commonly known as:  novoLOG  Inject 0-9 Units into the skin 3 (three) times daily with meals.     insulin glargine 100 UNIT/ML injection  Commonly known as:  LANTUS  Inject 0.3 mLs (30 Units total) into the skin at bedtime.     nitroGLYCERIN 0.4 MG SL tablet  Commonly known as:  NITROSTAT  Place 0.4 mg under the tongue every 5 (five) minutes as needed.     omeprazole 20 MG capsule  Commonly known as:  PRILOSEC  Take 20 mg by mouth daily.     polyethylene glycol packet  Commonly known as:  MIRALAX  Take 17 g by mouth daily as needed.     ranolazine 500 MG 12 hr tablet  Commonly known as:  RANEXA  Take 500 mg by mouth 2 (two) times daily.     senna-docusate 8.6-50 MG per tablet  Commonly known as:  Senokot-S  Take 1 tablet by mouth 2 (two) times daily.     simvastatin 40 MG tablet  Commonly known as:  ZOCOR  Take 20 mg by mouth at bedtime.       Follow-up Information   Follow up with Erick Colace, MD On 03/21/2013. (Be there at 1 pm for 1:30 appointment)    Contact information:   [address] [place] [address] [phone]       Follow up with  Gates Rigg, MD. Call today. (for follow up in 6 weeks.)    Contact information:   [address] [place] [address] [phone]       Follow up with Lewayne Bunting, MD On 03/17/2013.   Contact information:   [address] [place] [address] [phone]       Follow up with Zachery Dauer, MD On 03/10/2013. (APPT: 2:30 PM) -Needs repeat CBC.    Contact information:   [address] [place] [address] [phone]       Signed: Jacquelynn Cree 02/25/2013, 12:42 PM

## 2013-02-25 NOTE — Progress Notes (Signed)
Patient discharged home with his wife at 1210. Marissa Nestle PA discussed discharge instructions with the patient. No further questions at this time. Staff assisted patient downstairs to short stay exit.

## 2013-03-17 ENCOUNTER — Ambulatory Visit (INDEPENDENT_AMBULATORY_CARE_PROVIDER_SITE_OTHER): Payer: Medicare Other | Admitting: Internal Medicine

## 2013-03-17 ENCOUNTER — Encounter: Payer: Self-pay | Admitting: Internal Medicine

## 2013-03-17 VITALS — BP 132/64 | HR 71 | Ht 65.0 in | Wt 191.0 lb

## 2013-03-17 DIAGNOSIS — I639 Cerebral infarction, unspecified: Secondary | ICD-10-CM

## 2013-03-17 DIAGNOSIS — I5022 Chronic systolic (congestive) heart failure: Secondary | ICD-10-CM

## 2013-03-17 DIAGNOSIS — I4891 Unspecified atrial fibrillation: Secondary | ICD-10-CM

## 2013-03-17 DIAGNOSIS — I6350 Cerebral infarction due to unspecified occlusion or stenosis of unspecified cerebral artery: Secondary | ICD-10-CM

## 2013-03-17 NOTE — Patient Instructions (Signed)
Your physician recommends that you continue on your current medications as directed. Please refer to the Current Medication list given to you today.  Your physician recommends that you schedule a follow-up appointment in: 3 months with Dr.Taylor  

## 2013-03-17 NOTE — Progress Notes (Signed)
HPI Mr. Hack returns today for followup. I saw the patient back several weeks ago with worsening heart failure symptoms. At that time he had atrial fibrillation but his ventricular rate was not well controlled. We've made multiple medication adjustments including discontinuation of his calcium channel blockers, and initiating carvedilol. At the time, I considered anticoagulation and cardioversion. He has not been thought to be a candidate for anticoagulation because of a history, of GI bleeding. In the interim, he sustained a posterior circulation stroke. He has undergone rehabilitation and made a nice improvement. According to the patient's wife, his neurologist still does not think he is a candidate for any anticoagulations despite his fairly high risk of stroke. Allergies  Allergen Reactions  . Lorazepam   . Morphine And Related      Current Outpatient Prescriptions  Medication Sig Dispense Refill  . aspirin 81 MG tablet Take 81 mg by mouth daily.      . carvedilol (COREG) 12.5 MG tablet Take 1 tablet (12.5 mg total) by mouth 2 (two) times daily.  180 tablet  3  . cholecalciferol (VITAMIN D) 1000 UNITS tablet Take 1,000 Units by mouth daily.      . clopidogrel (PLAVIX) 75 MG tablet Take 75 mg by mouth daily.      Marland Kitchen doxazosin (CARDURA) 4 MG tablet Take 4 mg by mouth at bedtime.        . furosemide (LASIX) 40 MG tablet Take 1 tablet (40 mg total) by mouth 2 (two) times daily with breakfast and lunch.  60 tablet  1  . insulin aspart (NOVOLOG) 100 UNIT/ML injection Inject 0-9 Units into the skin 3 (three) times daily with meals.  1 vial    . insulin glargine (LANTUS) 100 UNIT/ML injection Inject 0.3 mLs (30 Units total) into the skin at bedtime.  10 mL    . nitroGLYCERIN (NITROSTAT) 0.4 MG SL tablet Place 0.4 mg under the tongue every 5 (five) minutes as needed.        Marland Kitchen omeprazole (PRILOSEC) 20 MG capsule Take 20 mg by mouth daily.       . polyethylene glycol (MIRALAX) packet Take 17 g by  mouth daily as needed.  14 each  0  . ranolazine (RANEXA) 500 MG 12 hr tablet Take 500 mg by mouth 2 (two) times daily.        Marland Kitchen senna-docusate (SENOKOT-S) 8.6-50 MG per tablet Take 1 tablet by mouth 2 (two) times daily.      . simvastatin (ZOCOR) 40 MG tablet Take 20 mg by mouth at bedtime.       . [DISCONTINUED] amLODipine (NORVASC) 5 MG tablet Take 5 mg by mouth daily.        . [DISCONTINUED] benazepril (LOTENSIN) 20 MG tablet Take 20 mg by mouth daily.        . [DISCONTINUED] metoprolol (LOPRESSOR) 100 MG tablet Take 100 mg by mouth 2 (two) times daily.        . [DISCONTINUED] telmisartan (MICARDIS) 80 MG tablet Take 80 mg by mouth daily.         No current facility-administered medications for this visit.     Past Medical History  Diagnosis Date  . RBBB (right bundle branch block)   . AAA (abdominal aortic aneurysm)   . AMI (acute myocardial infarction)   . Diabetes mellitus   . Coronary artery disease   . Pacemaker   . Defibrillator activation   . Hypertension   . Heart disease   .  Heart attack   . Wears dentures   . Hyperlipidemia   . Shortness of breath   . Kidney disease   . Hyperpotassemia   . Edema     BLE  . Systolic CHF     ROS:   All systems reviewed and negative except as noted in the HPI.   Past Surgical History  Procedure Laterality Date  . Coronary artery bypass graft  1990  . Icd  2011  . Hernia repair      umbilical and RIH  . Aneurysm coiling    . Rotator cuff repair      bilateral  . Lasik      with lens implant.      Family History  Problem Relation Age of Onset  . Heart disease Mother   . Diabetes Mother   . Cancer Brother     colon  . Heart disease Brother     heart attack  . Stroke Other   . Hypertension Other      History   Social History  . Marital Status: Married    Spouse Name: N/A    Number of Children: N/A  . Years of Education: N/A   Occupational History  . Not on file.   Social History Main Topics  .  Smoking status: Former Smoker -- 30 years    Types: Cigarettes    Quit date: 11/28/1987  . Smokeless tobacco: Never Used  . Alcohol Use: Yes  . Drug Use: No  . Sexually Active: Not on file   Other Topics Concern  . Not on file   Social History Narrative  . No narrative on file     BP 132/64  Pulse 71  Ht 5\' 5"  (1.651 m)  Wt 191 lb (86.637 kg)  BMI 31.78 kg/m2  SpO2 94%  Physical Exam:  stable appearing 79 year old man, NAD, but using a walker for ambulation HEENT: Unremarkable Neck:  No JVD, no thyromegally Back:  No CVA tenderness Lungs:  Clear with no wheezes, rales, or rhonchi. HEART:  IRegular rate rhythm, no murmurs, no rubs, no clicks Abd:  soft, positive bowel sounds, no organomegally, no rebound, no guarding Ext:  2 plus pulses, no edema, no cyanosis, no clubbing Skin:  No rashes no nodules Neuro:  CN II through XII intact, motor grossly intact, except for difficulty with gait and balance  DEVICE  Recently interrogated, not today.  Assess/Plan:

## 2013-03-17 NOTE — Assessment & Plan Note (Signed)
He is currently stable. He is undergoing rehabilitation. His condition is improved.

## 2013-03-17 NOTE — Assessment & Plan Note (Signed)
His atrial fibrillation persists. At the current time, he is not a candidate for anticoagulation. I plan to review the risk and benefits of this therapy with the patient's neurologist.

## 2013-03-17 NOTE — Assessment & Plan Note (Signed)
Since I saw the patient last, his heart failure symptoms are improved. His ventricular rate appears improved as does his blood pressure. I've encouraged the patient to maintain a low-sodium diet.

## 2013-03-21 ENCOUNTER — Encounter: Payer: Self-pay | Admitting: Physical Medicine & Rehabilitation

## 2013-03-21 ENCOUNTER — Ambulatory Visit (HOSPITAL_BASED_OUTPATIENT_CLINIC_OR_DEPARTMENT_OTHER): Payer: Medicare Other | Admitting: Physical Medicine & Rehabilitation

## 2013-03-21 ENCOUNTER — Encounter: Payer: Medicare Other | Attending: Physical Medicine & Rehabilitation

## 2013-03-21 VITALS — BP 166/61 | HR 79 | Resp 14 | Ht 65.0 in | Wt 187.0 lb

## 2013-03-21 DIAGNOSIS — I69998 Other sequelae following unspecified cerebrovascular disease: Secondary | ICD-10-CM | POA: Insufficient documentation

## 2013-03-21 DIAGNOSIS — I252 Old myocardial infarction: Secondary | ICD-10-CM | POA: Insufficient documentation

## 2013-03-21 DIAGNOSIS — I10 Essential (primary) hypertension: Secondary | ICD-10-CM | POA: Insufficient documentation

## 2013-03-21 DIAGNOSIS — E785 Hyperlipidemia, unspecified: Secondary | ICD-10-CM | POA: Insufficient documentation

## 2013-03-21 DIAGNOSIS — I2510 Atherosclerotic heart disease of native coronary artery without angina pectoris: Secondary | ICD-10-CM | POA: Insufficient documentation

## 2013-03-21 DIAGNOSIS — Z95810 Presence of automatic (implantable) cardiac defibrillator: Secondary | ICD-10-CM | POA: Insufficient documentation

## 2013-03-21 DIAGNOSIS — I69993 Ataxia following unspecified cerebrovascular disease: Secondary | ICD-10-CM

## 2013-03-21 DIAGNOSIS — E119 Type 2 diabetes mellitus without complications: Secondary | ICD-10-CM | POA: Insufficient documentation

## 2013-03-21 DIAGNOSIS — Z951 Presence of aortocoronary bypass graft: Secondary | ICD-10-CM | POA: Insufficient documentation

## 2013-03-21 DIAGNOSIS — I4891 Unspecified atrial fibrillation: Secondary | ICD-10-CM | POA: Insufficient documentation

## 2013-03-21 NOTE — Patient Instructions (Addendum)
Followup with your hearing specialist to reassess hearing impairment

## 2013-03-21 NOTE — Progress Notes (Signed)
Subjective:    Patient ID: Jason Walker, male    DOB: 1930/05/21, 79 y.o.   MRN: [id number] Jason Walker is a 79 y.o. male with history of DM, CAD, A-Fib but no anticoagulation due to life threating GIB, ICD; who was admitted to [facility] on 02/09/13 with N/V, fall and unresponsiveness. Initial CCT negative. [facility] 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 Abdominal U/S done due to elevated amylase and CKD. This revealed 7 mm gallstone, no cholecystitis and atrophic left kidneyNeurology recommended continuing ASA/Plavix and close monitoring as at risk for hydrocephalus and increased ICP. Follow up CCT 03/18 with stable L-cerebellar infarct with extensive cytotoxic edema and mass effect. Therapy team recommended CIR and patient admitted on 02/14/13.  HPI Discharge from CIR, has received home health therapy and has been discharged from home health. He is independent with his dressing and bathing. He uses a cane to ambulate. Prior to his infarct, he did not require a cane. His wife is assisting with home exercise program Pain Inventory Average Pain 0 Pain Right Now 0 My pain is no complaints  In the last 24 hours, has pain interfered with the following? General activity 10 Relation with others 10 Enjoyment of life 10 What TIME of day is your pain at its worst? n/a Sleep (in general) Fair  Pain is worse with: walking and some activites Pain improves with: pacing activities Relief from Meds: n/a  Mobility use a cane Do you have any goals in this area?  yes  Function retired  Neuro/Psych trouble walking confusion  Prior Studies Any changes since last visit?  no  Physicians involved in your care Any changes since last visit?  no   Family History  Problem Relation Age of Onset  . Heart disease Mother   . Diabetes Mother   . Cancer Brother     colon  . Heart  disease Brother     heart attack  . Stroke Other   . Hypertension Other    History   Social History  . Marital Status: Married    Spouse Name: N/A    Number of Children: N/A  . Years of Education: N/A   Social History Main Topics  . Smoking status: Former Smoker -- 30 years    Types: Cigarettes    Quit date: 11/28/1987  . Smokeless tobacco: Never Used  . Alcohol Use: Yes  . Drug Use: No  . Sexually Active: None   Other Topics Concern  . None   Social History Narrative  . None   Past Surgical History  Procedure Laterality Date  . Coronary artery bypass graft  1990  . Icd  2011  . Hernia repair      umbilical and RIH  . Aneurysm coiling    . Rotator cuff repair      bilateral  . Lasik      with lens implant.    Past Medical History  Diagnosis Date  . RBBB (right bundle branch block)   . AAA (abdominal aortic aneurysm)   . AMI (acute myocardial infarction)   . Diabetes mellitus   . Coronary artery disease   . Pacemaker   . Defibrillator activation   . Hypertension   . Heart disease   . Heart attack   . Wears dentures   . Hyperlipidemia   . Shortness of breath   . Kidney disease   .  Hyperpotassemia   . Edema     BLE  . Systolic CHF   . Stroke    BP 166/61  Pulse 79  Resp 14  Ht 5\' 5"  (1.651 m)  Wt 187 lb (84.823 kg)  BMI 31.12 kg/m2  SpO2 96%     Review of Systems  Musculoskeletal: Positive for gait problem.  Psychiatric/Behavioral: Positive for confusion.  All other systems reviewed and are negative.       Objective:   Physical Exam Gen. No acute distress Mood and affect are appropriate HEENT severe hearing impairment Motor strength is 4/5 in the right deltoid, biceps, triceps, grip, 5/5 on the left side 5/5 in bilateral hip flexors knee extensors ankle dorsiflexor and plantar flexor There is mild dysmetria on left finger nose to finger and left heel to shin Ambulates without assisted device for short distances. No evidence of toe  drag or knee instability       Assessment & Plan:  1. Left cerebellar infarct functionally independent. He may improve from a balance standpoint over the next several months. I operate referral to outpatient therapy however he declined. I'll see him back on a when necessary basis. He'll follow up with neurology as well as cardiology and his primary physician

## 2013-04-07 ENCOUNTER — Encounter: Payer: Self-pay | Admitting: Neurology

## 2013-04-07 ENCOUNTER — Ambulatory Visit (INDEPENDENT_AMBULATORY_CARE_PROVIDER_SITE_OTHER): Payer: Medicare Other | Admitting: Neurology

## 2013-04-07 VITALS — BP 176/83 | HR 84 | Temp 98.3°F | Ht 67.5 in | Wt 193.0 lb

## 2013-04-07 DIAGNOSIS — I639 Cerebral infarction, unspecified: Secondary | ICD-10-CM

## 2013-04-07 DIAGNOSIS — I6350 Cerebral infarction due to unspecified occlusion or stenosis of unspecified cerebral artery: Secondary | ICD-10-CM

## 2013-04-07 NOTE — Patient Instructions (Signed)
Continue taking Aspirin 81 mg and Plavix 75 mg for next 3 months. Report any signs of bleeding to office. Return for follow up in 3 months.    STROKE/TIA INSTRUCTIONS SMOKING Cigarette smoking nearly doubles your risk of having a stroke & is the single most alterable risk factor  If you smoke or have smoked in the last 12 months, you are advised to quit smoking for your health.  Most of the excess cardiovascular risk related to smoking disappears within a year of stopping.  Ask you doctor about anti-smoking medications  [place] Quit Line: 1-800-QUIT NOW  Free Smoking Cessation Classes [phone]  CHOLESTEROL Know your levels; limit fat & cholesterol in your diet  Lab Results  Component Value Date   CHOL 168 02/10/2013   HDL 35* 02/10/2013   LDLCALC 103* 02/10/2013   TRIG 150* 02/10/2013   CHOLHDL 4.8 02/10/2013      Many patients benefit from treatment even if their cholesterol is at goal.  Goal: Total Cholesterol less than 160  Goal:  LDL less than 100  Goal:  HDL greater than 40  Goal:  Triglycerides less than 150  BLOOD PRESSURE American Stroke Association blood pressure target is less that 120/80 mm/Hg  Your discharge blood pressure is:  BP: 176/83 mmHg  Monitor your blood pressure  Limit your salt and alcohol intake  Many individuals will require more than one medication for high blood pressure  DIABETES (A1c is a blood sugar average for last 3 months) Goal A1c is under 7% (A1c is blood sugar average for last 3 months)  Diabetes: Diagnosis of diabetes:  Your A1c:10.0 %    Lab Results  Component Value Date   HGBA1C 10.0* 02/10/2013    Your A1c can be lowered with medications, healthy diet, and exercise.  Check your blood sugar as directed by your physician  Call your physician if you experience unexplained or low blood sugars.  PHYSICAL ACTIVITY/REHABILITATION Goal is 30 minutes at least 4 days per week    Activity decreases your risk of heart attack and stroke  and makes your heart stronger.  It helps control your weight and blood pressure; helps you relax and can improve your mood.  Participate in a regular exercise program.  Talk with your doctor about the best form of exercise for you (dancing, walking, swimming, cycling).  DIET/WEIGHT Goal is to maintain a healthy weight  Your height is:  Height: 5' 7.5" (171.5 cm) Your current weight is: Weight: 193 lb (87.544 kg) Your body Mass Index (BMI) is:  BMI (Calculated): 29.8  Following the type of diet specifically designed for you will help prevent another stroke.  Your goal Body Mass Index (BMI) is 19-24.  Healthy food habits can help reduce 3 risk factors for stroke:  High cholesterol, hypertension, and excess weight.

## 2013-04-07 NOTE — Progress Notes (Signed)
[facility]  PATIENT: Jason Walker DOB: [birth year]   HISTORY FROM: patient, wife REASON FOR VISIT: stroke follow up HISTORY OF PRESENT ILLNESS: Jason Walker is a 79 year old male with known A. fib but not on anticoagulation due to previous life-threatening GI bleed, PVT status post ICD, CKD. Not a Coumadin candidate secondary to history of previous GI bleeding in October 2013 requiring 7 unit transfusions, at high risk for rebleeding per gastroenterologist. Patient was found on 02/09/2013 by wife in the morning unresponsive and he had been vomiting. He was taken to [facility] but transferred to [facility] due to lack of neurology coverage at [facility]. 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 Infarcts felt to be embolic secondary to known atrial fibrillation. Patient with resultant lethargy, nausea, vomiting, headache, dysarthria, visual abnormalities, dizziness, dysphasia, and eye movement abnormalities. Patient was discharged to inpatient rehabilitation on aspirin 81 mg and Plavix 75 mg orally daily for secondary stroke prevention.  Patient is seen in office today much improved. He continues on aspirin 81 mg and Plavix 75 mg daily. Patient denies medication side effects, with no signs of bleeding.  Patient has completed inpatient therapy. He is independent with his dressing and bathing. He uses a cane to ambulate when away from home.  Denies pain, paresthesias, recent falls and headaches. He states his blood pressure is well controlled but his sugars have ranged from 90s to 130s fasting . REVIEW OF SYSTEMS: Full 14 system review of systems performed and notable only for constitutional: Blurred vision, double vision Hematology/Lymph: easy bruising respiratory: snoring, endocrine: feeling cold ear/nose/throat: Hearing loss neurological: Memory loss,  weakness,slurred speech sleep: snoring psychiatric: decreased appetite.   ALLERGIES: Allergies  Allergen Reactions  . Lorazepam   . Morphine And Related     HOME MEDICATIONS: Outpatient Prescriptions Prior to Visit  Medication Sig Dispense Refill  . aspirin 81 MG tablet Take 81 mg by mouth daily.      . carvedilol (COREG) 12.5 MG tablet Take 1 tablet (12.5 mg total) by mouth 2 (two) times daily.  180 tablet  3  . cholecalciferol (VITAMIN D) 1000 UNITS tablet Take 1,000 Units by mouth daily.      . clopidogrel (PLAVIX) 75 MG tablet Take 75 mg by mouth daily.      Marland Kitchen doxazosin (CARDURA) 4 MG tablet Take 4 mg by mouth at bedtime.        . furosemide (LASIX) 40 MG tablet Take 1 tablet (40 mg total) by mouth 2 (two) times daily with breakfast and lunch.  60 tablet  1  . insulin aspart (NOVOLOG) 100 UNIT/ML injection Inject 0-9 Units into the skin 3 (three) times daily with meals.  1 vial    . insulin glargine (LANTUS) 100 UNIT/ML injection Inject 0.3 mLs (30 Units total) into the skin at bedtime.  10 mL    . nitroGLYCERIN (NITROSTAT) 0.4 MG SL tablet Place 0.4 mg under the tongue every 5 (five) minutes as needed.        Marland Kitchen omeprazole (PRILOSEC) 20 MG capsule Take 20 mg by mouth daily.       . polyethylene glycol (MIRALAX) packet Take 17 g by mouth daily as needed.  14 each  0  . ranolazine (RANEXA) 500 MG 12 hr tablet Take 500 mg by mouth 2 (two) times daily.        Marland Kitchen senna-docusate (SENOKOT-S) 8.6-50 MG  per tablet Take 1 tablet by mouth 2 (two) times daily.      . simvastatin (ZOCOR) 40 MG tablet Take 20 mg by mouth at bedtime.        No facility-administered medications prior to visit.    PAST MEDICAL HISTORY: Past Medical History  Diagnosis Date  . RBBB (right bundle branch block)   . AAA (abdominal aortic aneurysm)   . AMI (acute myocardial infarction)   . Diabetes mellitus   . Coronary artery disease   . Pacemaker   . Defibrillator activation   . Hypertension   . Heart disease    . Heart attack   . Wears dentures   . Hyperlipidemia   . Shortness of breath   . Kidney disease   . Hyperpotassemia   . Edema     BLE  . Systolic CHF   . Stroke     PAST SURGICAL HISTORY: Past Surgical History  Procedure Laterality Date  . Coronary artery bypass graft  1990  . Icd  2011  . Hernia repair      umbilical and RIH  . Aneurysm coiling    . Rotator cuff repair      bilateral  . Lasik      with lens implant.     FAMILY HISTORY: Family History  Problem Relation Age of Onset  . Heart disease Mother   . Diabetes Mother   . Cancer Brother     colon  . Heart disease Brother     heart attack  . Stroke Other   . Hypertension Other     SOCIAL HISTORY: History   Social History  . Marital Status: Married    Spouse Name: N/A    Number of Children: N/A  . Years of Education: N/A   Occupational History  . Not on file.   Social History Main Topics  . Smoking status: Former Smoker -- 30 years    Types: Cigarettes    Quit date: 11/28/1987  . Smokeless tobacco: Never Used  . Alcohol Use: Yes  . Drug Use: No  . Sexually Active: Not on file   Other Topics Concern  . Not on file   Social History Narrative  . No narrative on file     PHYSICAL EXAM  Filed Vitals:   04/07/13 1359  BP: 176/83  Pulse: 84  Temp: 98.3 F (36.8 C)  TempSrc: Oral  Height: 5' 7.5" (1.715 m)  Weight: 193 lb (87.544 kg)   Body mass index is 29.76 kg/(m^2).  GENERAL EXAM: Patient is in no distress, well developed and well groomed. HEAD: Symmetric facial features. EARS, NOSE, and THROAT: Normal.  NECK: Supple, no JVD RESPIRATORY: Lungs CTA. CARDIOVASCULAR: irregular rate and rhythm SKIN: No rash, no bruising. AV fistula right arm for future dialysis  NEUROLOGIC: MENTAL STATUS: awake, alert and oriented to person, place, CANNOT STATE MONTH CRANIAL NERVE: pupils equal and reactive to light, visual fields full to confrontation, extraocular muscles intact, no  nystagmus, facial sensation and strength symmetric, uvula midline, shoulder shrug symmetric, tongue midline. MILD DYSARTHRIA MOTOR: normal bulk and tone, full strength in the BUE, BLE, No arm drift SENSORY: normal and symmetric to light touch, pinprick, temperature, vibration and proprioception COORDINATION: finger-nose-finger, fine finger movements normal. Orbits right or left approximately REFLEXES: deep tendon reflexes present and symmetric 2+ GAIT/STATION: narrow based gait; able to walk on toes, heels.  DIFFICULTY WITH TANDEM WALK. romberg is negative. Using no assistive device.   DIAGNOSTIC DATA (LABS,  IMAGING, TESTING) - I reviewed patient records, labs, notes, testing and imaging myself where available.  Lab Results  Component Value Date   WBC 9.3 02/17/2013   HGB 12.2* 02/17/2013   HCT 35.7* 02/17/2013   MCV 85.8 02/17/2013   PLT 115* 02/17/2013      Component Value Date/Time   NA 134* 02/20/2013 0518   K 3.9 02/20/2013 0518   CL 96 02/20/2013 0518   CO2 31 02/20/2013 0518   GLUCOSE 162* 02/20/2013 0518   BUN 30* 02/20/2013 0518   CREATININE 1.98* 02/20/2013 0518   CALCIUM 9.0 02/20/2013 0518   PROT 7.0 02/17/2013 0740   ALBUMIN 3.3* 02/17/2013 0740   AST 22 02/17/2013 0740   ALT 14 02/17/2013 0740   ALKPHOS 55 02/17/2013 0740   BILITOT 0.6 02/17/2013 0740   GFRNONAA 30* 02/20/2013 0518   GFRAA 35* 02/20/2013 0518   Lab Results  Component Value Date   CHOL 168 02/10/2013   HDL 35* 02/10/2013   LDLCALC 103* 02/10/2013   TRIG 150* 02/10/2013   CHOLHDL 4.8 02/10/2013   Lab Results  Component Value Date   HGBA1C 10.0* 02/10/2013   No results found for this basename: VITAMINB12   No results found for this basename: TSH     ASSESSMENT AND PLAN  79 y.o. 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 Infarct felt to be embolic secondary to  known atrial fibrillation. Not a Coumadin candidate secondary to history of previous GI bleeding in October 2013. Now on aspirin 81 mg orally every day and Plavix 75 mg orally every day for secondary stroke prevention. Will continue Plavix until patient's next visit and then likely DC. Instructed patient to call office with any signs of bleeding. Advised patient to continue to use cane for ambulation. Fall falls assessment score is 8 which indicates moderate risk. Advised patient not to drive a car or lawnmower at least until next visit.   Hx GIB Oct 2013, at high risk for rebleeding or GI  CKD, stage IV. Goal Cr 2-3 per nephrologist per wife.  Strict hypertension control with Blood Pressure goal less than 130/80. BP in office today is 176/83.  Cholesterol goals are less than 200 for total cholesterol and less than 100 for LDL.  Strict control of diabetes with hemoglobin A1c less than 6.5. HGBA1c in hospital was 10.0.  Return for follow up appt in 3 months.   Saifullah Jolley NP-C 04/07/2013, 4:59 PM  [facility] [address], [address] [place], [place] [address] [phone]  I have personally examined this patient, reviewed history and pertinent data and discuss with family and answered questions.  Delia Heady, MD

## 2013-05-12 ENCOUNTER — Ambulatory Visit (INDEPENDENT_AMBULATORY_CARE_PROVIDER_SITE_OTHER): Payer: Medicare Other | Admitting: *Deleted

## 2013-05-12 ENCOUNTER — Encounter: Payer: Self-pay | Admitting: Internal Medicine

## 2013-05-12 DIAGNOSIS — I4729 Other ventricular tachycardia: Secondary | ICD-10-CM

## 2013-05-12 DIAGNOSIS — I5022 Chronic systolic (congestive) heart failure: Secondary | ICD-10-CM

## 2013-05-12 DIAGNOSIS — I472 Ventricular tachycardia: Secondary | ICD-10-CM

## 2013-05-12 DIAGNOSIS — Z95810 Presence of automatic (implantable) cardiac defibrillator: Secondary | ICD-10-CM

## 2013-05-27 LAB — REMOTE ICD DEVICE
AL IMPEDENCE ICD: 475 Ohm
AL THRESHOLD: 0.75 V
ATRIAL PACING ICD: 35.46 pct
BATTERY VOLTAGE: 3.0767 V
CHARGE TIME: 9.539 s
PACEART VT: 0
TOT-0001: 1
TOT-0002: 0
TOT-0006: 20110510000000
TZAT-0001ATACH: 1
TZAT-0001ATACH: 2
TZAT-0001ATACH: 3
TZAT-0001FASTVT: 1
TZAT-0001SLOWVT: 1
TZAT-0002ATACH: NEGATIVE
TZAT-0002FASTVT: NEGATIVE
TZAT-0005SLOWVT: 88 pct
TZAT-0012ATACH: 150 ms
TZAT-0012ATACH: 150 ms
TZAT-0013SLOWVT: 3
TZAT-0018ATACH: NEGATIVE
TZAT-0018ATACH: NEGATIVE
TZAT-0018SLOWVT: NEGATIVE
TZAT-0019ATACH: 6 V
TZAT-0019SLOWVT: 8 V
TZAT-0020ATACH: 1.5 ms
TZAT-0020ATACH: 1.5 ms
TZON-0003ATACH: 350 ms
TZON-0003VSLOWVT: 450 ms
TZON-0004SLOWVT: 40
TZON-0004VSLOWVT: 20
TZON-0005SLOWVT: 12
TZST-0001ATACH: 4
TZST-0001ATACH: 6
TZST-0001FASTVT: 3
TZST-0001FASTVT: 4
TZST-0001FASTVT: 5
TZST-0001SLOWVT: 2
TZST-0001SLOWVT: 3
TZST-0001SLOWVT: 5
TZST-0001SLOWVT: 6
TZST-0002FASTVT: NEGATIVE
TZST-0002FASTVT: NEGATIVE
TZST-0002FASTVT: NEGATIVE
TZST-0002FASTVT: NEGATIVE
TZST-0003SLOWVT: 35 J
VENTRICULAR PACING ICD: 3.59 pct

## 2013-06-03 ENCOUNTER — Encounter: Payer: Self-pay | Admitting: *Deleted

## 2013-07-08 ENCOUNTER — Encounter: Payer: Self-pay | Admitting: Internal Medicine

## 2013-07-08 ENCOUNTER — Ambulatory Visit (INDEPENDENT_AMBULATORY_CARE_PROVIDER_SITE_OTHER): Payer: Medicare Other | Admitting: Internal Medicine

## 2013-07-08 VITALS — BP 150/76 | HR 69 | Ht 65.0 in | Wt 201.8 lb

## 2013-07-08 DIAGNOSIS — I472 Ventricular tachycardia: Secondary | ICD-10-CM

## 2013-07-08 DIAGNOSIS — Z95810 Presence of automatic (implantable) cardiac defibrillator: Secondary | ICD-10-CM

## 2013-07-08 DIAGNOSIS — R0989 Other specified symptoms and signs involving the circulatory and respiratory systems: Secondary | ICD-10-CM

## 2013-07-08 DIAGNOSIS — R0600 Dyspnea, unspecified: Secondary | ICD-10-CM | POA: Insufficient documentation

## 2013-07-08 DIAGNOSIS — I5022 Chronic systolic (congestive) heart failure: Secondary | ICD-10-CM

## 2013-07-08 DIAGNOSIS — R0602 Shortness of breath: Secondary | ICD-10-CM

## 2013-07-08 LAB — ICD DEVICE OBSERVATION
AL THRESHOLD: 0.75 V
BAMS-0001: 170 {beats}/min
DEV-0020ICD: NEGATIVE
FVT: 0
PACEART VT: 0
RV LEAD AMPLITUDE: 5 mv
TOT-0001: 1
TZAT-0001ATACH: 3
TZAT-0001FASTVT: 1
TZAT-0002ATACH: NEGATIVE
TZAT-0004SLOWVT: 8
TZAT-0005SLOWVT: 88 pct
TZAT-0012ATACH: 150 ms
TZAT-0012ATACH: 150 ms
TZAT-0012SLOWVT: 200 ms
TZAT-0013SLOWVT: 3
TZAT-0018ATACH: NEGATIVE
TZAT-0018FASTVT: NEGATIVE
TZAT-0019ATACH: 6 V
TZAT-0019FASTVT: 8 V
TZAT-0020ATACH: 1.5 ms
TZAT-0020ATACH: 1.5 ms
TZON-0003SLOWVT: 340 ms
TZON-0003VSLOWVT: 450 ms
TZON-0004SLOWVT: 40
TZST-0001ATACH: 5
TZST-0001ATACH: 6
TZST-0001FASTVT: 2
TZST-0001FASTVT: 6
TZST-0001SLOWVT: 2
TZST-0001SLOWVT: 5
TZST-0002ATACH: NEGATIVE
TZST-0002ATACH: NEGATIVE
TZST-0002FASTVT: NEGATIVE
TZST-0002FASTVT: NEGATIVE
TZST-0003SLOWVT: 35 J
TZST-0003SLOWVT: 35 J
VENTRICULAR PACING ICD: 2.43 pct

## 2013-07-08 NOTE — Progress Notes (Signed)
HPI Mr. Bischof returns today for followup. He is a pleasant 79 yo man with an ICM, chronic systolic CHF, HTN, PAF, and a h/o tobacco abuse. His dyspnea appears to have worsened over the past several months despite return to [facility]. He has not had chest pain. He has minimal if any peripheral edema. His wife notes that he wheezes. No syncope. No chest pain. Allergies  Allergen Reactions  . Lorazepam   . Morphine And Related      Current Outpatient Prescriptions  Medication Sig Dispense Refill  . aspirin 81 MG tablet Take 81 mg by mouth daily.      . carvedilol (COREG) 12.5 MG tablet Take 1 tablet (12.5 mg total) by mouth 2 (two) times daily.  180 tablet  3  . cholecalciferol (VITAMIN D) 1000 UNITS tablet Take 1,000 Units by mouth daily.      . clopidogrel (PLAVIX) 75 MG tablet Take 75 mg by mouth daily.      Marland Kitchen doxazosin (CARDURA) 4 MG tablet Take 4 mg by mouth at bedtime.        . furosemide (LASIX) 40 MG tablet Take 1 tablet (40 mg total) by mouth 2 (two) times daily with breakfast and lunch.  60 tablet  1  . insulin aspart (NOVOLOG) 100 UNIT/ML injection Inject 0-9 Units into the skin 3 (three) times daily with meals.  1 vial    . insulin glargine (LANTUS) 100 UNIT/ML injection Inject 0.3 mLs (30 Units total) into the skin at bedtime.  10 mL    . nitroGLYCERIN (NITROSTAT) 0.4 MG SL tablet Place 0.4 mg under the tongue every 5 (five) minutes as needed.        Marland Kitchen omeprazole (PRILOSEC) 20 MG capsule Take 20 mg by mouth daily.       . polyethylene glycol (MIRALAX) packet Take 17 g by mouth daily as needed.  14 each  0  . ranolazine (RANEXA) 500 MG 12 hr tablet Take 500 mg by mouth 2 (two) times daily.        Marland Kitchen senna-docusate (SENOKOT-S) 8.6-50 MG per tablet Take 1 tablet by mouth 2 (two) times daily.      . simvastatin (ZOCOR) 40 MG tablet Take 20 mg by mouth at bedtime.       . [DISCONTINUED] amLODipine (NORVASC) 5 MG tablet Take 5 mg by mouth daily.        . [DISCONTINUED] benazepril (LOTENSIN)  20 MG tablet Take 20 mg by mouth daily.        . [DISCONTINUED] metoprolol (LOPRESSOR) 100 MG tablet Take 100 mg by mouth 2 (two) times daily.        . [DISCONTINUED] telmisartan (MICARDIS) 80 MG tablet Take 80 mg by mouth daily.         No current facility-administered medications for this visit.     Past Medical History  Diagnosis Date  . RBBB (right bundle branch block)   . AAA (abdominal aortic aneurysm)   . AMI (acute myocardial infarction)   . Diabetes mellitus   . Coronary artery disease   . Pacemaker   . Defibrillator activation   . Hypertension   . Heart disease   . Heart attack   . Wears dentures   . Hyperlipidemia   . Shortness of breath   . Kidney disease   . Hyperpotassemia   . Edema     BLE  . Systolic CHF   . Stroke     ROS:   All systems reviewed  and negative except as noted in the HPI.   Past Surgical History  Procedure Laterality Date  . Coronary artery bypass graft  1990  . Icd  2011  . Hernia repair      umbilical and RIH  . Aneurysm coiling    . Rotator cuff repair      bilateral  . Lasik      with lens implant.      Family History  Problem Relation Age of Onset  . Heart disease Mother   . Diabetes Mother   . Cancer Brother     colon  . Heart disease Brother     heart attack  . Stroke Other   . Hypertension Other      History   Social History  . Marital Status: Married    Spouse Name: N/A    Number of Children: N/A  . Years of Education: N/A   Occupational History  . Not on file.   Social History Main Topics  . Smoking status: Former Smoker -- 30 years    Types: Cigarettes    Quit date: 11/28/1987  . Smokeless tobacco: Never Used  . Alcohol Use: Yes  . Drug Use: No  . Sexually Active: Not on file   Other Topics Concern  . Not on file   Social History Narrative  . No narrative on file     BP 150/76  Pulse 69  Ht 5\' 5"  (1.651 m)  Wt 201 lb 12.8 oz (91.536 kg)  BMI 33.58 kg/m2  SpO2 95%  Physical  Exam:  Well appearing eldelry man, NAD HEENT: Unremarkable Neck:  7 cm JVD, no thyromegally Back:  No CVA tenderness Lungs:  Clear with no wheezes rales, or rhonchi HEART:  Regular rate rhythm, no murmurs, no rubs, no clicks Abd:  soft, positive bowel sounds, no organomegally, no rebound, no guarding Ext:  2 plus pulses, no edema, no cyanosis, no clubbing Skin:  No rashes no nodules Neuro:  CN II through XII intact, motor grossly intact   DEVICE  Normal device function.  See PaceArt for details.   Assess/Plan:

## 2013-07-08 NOTE — Assessment & Plan Note (Signed)
The etiology is unclear. I suspect his dyspnea is multifactorial. I have recommended he undergo PFT testing as the severity of his symptoms does not match his exam or his echo data.

## 2013-07-08 NOTE — Assessment & Plan Note (Signed)
His medtronic ICD is working normally. Will recheck in several months. 

## 2013-07-08 NOTE — Patient Instructions (Addendum)
Please send a [facility] transmission on November 17,2014  Your physician wants you to follow-up in: 1  Year with Dr. Ladona Ridgel.  You will receive a reminder letter in the mail two months in advance. If you don't receive a letter, please call our office to schedule the follow-up appointment.  Your physician has recommended that you have a pulmonary function test. Pulmonary Function Tests are a group of tests that measure how well air moves in and out of your lungs.

## 2013-07-11 ENCOUNTER — Encounter: Payer: Self-pay | Admitting: Internal Medicine

## 2013-07-11 ENCOUNTER — Encounter: Payer: Medicare Other | Admitting: Internal Medicine

## 2013-07-15 ENCOUNTER — Ambulatory Visit (HOSPITAL_COMMUNITY)
Admission: RE | Admit: 2013-07-15 | Discharge: 2013-07-15 | Disposition: A | Discharge status: Home / Self Care | Payer: Medicare Other | Source: Ambulatory Visit | Attending: Internal Medicine | Admitting: Internal Medicine

## 2013-07-15 ENCOUNTER — Ambulatory Visit: Payer: Self-pay | Admitting: Neurology

## 2013-07-15 DIAGNOSIS — R0989 Other specified symptoms and signs involving the circulatory and respiratory systems: Secondary | ICD-10-CM | POA: Insufficient documentation

## 2013-07-15 DIAGNOSIS — R0602 Shortness of breath: Secondary | ICD-10-CM

## 2013-07-15 DIAGNOSIS — R0609 Other forms of dyspnea: Secondary | ICD-10-CM | POA: Insufficient documentation

## 2013-07-15 LAB — BLOOD GAS, ARTERIAL
Acid-Base Excess: 6.3 mmol/L — ABNORMAL HIGH (ref 0.0–2.0)
Bicarbonate: 30.3 mEq/L — ABNORMAL HIGH (ref 20.0–24.0)
O2 Saturation: 95.5 %
pCO2 arterial: 44.2 mmHg (ref 35.0–45.0)
pO2, Arterial: 79.4 mmHg — ABNORMAL LOW (ref 80.0–100.0)

## 2013-07-15 MED ORDER — ALBUTEROL SULFATE (5 MG/ML) 0.5% IN NEBU
2.5000 mg | INHALATION_SOLUTION | Freq: Once | RESPIRATORY_TRACT | Status: AC
  Administered 2013-07-15: 2.5 mg via RESPIRATORY_TRACT

## 2013-07-21 ENCOUNTER — Ambulatory Visit (INDEPENDENT_AMBULATORY_CARE_PROVIDER_SITE_OTHER): Payer: Medicare Other | Admitting: Neurology

## 2013-07-21 ENCOUNTER — Encounter: Payer: Self-pay | Admitting: Neurology

## 2013-07-21 VITALS — BP 148/62 | HR 66 | Temp 97.7°F | Ht 65.5 in | Wt 199.0 lb

## 2013-07-21 DIAGNOSIS — I6350 Cerebral infarction due to unspecified occlusion or stenosis of unspecified cerebral artery: Secondary | ICD-10-CM

## 2013-07-21 DIAGNOSIS — I639 Cerebral infarction, unspecified: Secondary | ICD-10-CM

## 2013-07-21 DIAGNOSIS — I5022 Chronic systolic (congestive) heart failure: Secondary | ICD-10-CM

## 2013-07-21 DIAGNOSIS — E119 Type 2 diabetes mellitus without complications: Secondary | ICD-10-CM

## 2013-07-21 MED ORDER — ASPIRIN EC 325 MG PO TBEC: 325.0000 mg | DELAYED_RELEASE_TABLET | Freq: Every day | ORAL | Status: DC

## 2013-07-21 NOTE — Patient Instructions (Signed)
Change to aspirin 325 mg orally every day secondary stroke prevention. Discontinue Plavix.  Follow up in 6 months.

## 2013-07-21 NOTE — Progress Notes (Signed)
[facility]  PATIENT: Jason Walker DOB: 1930-01-09   HISTORY FROM: patient, wife REASON FOR VISIT: routine follow up   HISTORICAL  CHIEF COMPLAINT:  Chief Complaint  Patient presents with  . Follow-up   HPI Jason Walker is a 79 year old male with known A. fib but not on anticoagulation due to previous life-threatening GI bleed, PVT status post ICD, CKD. Not a Coumadin candidate secondary to history of previous GI bleeding in October 2013 requiring 7 unit transfusions, at high risk for rebleeding per gastroenterologist. Patient was found on 02/09/2013 by wife in the morning unresponsive and he had been vomiting. He was taken to [facility] but transferred to [facility] due to lack of neurology coverage at [facility]. 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 Infarcts felt to be embolic secondary to known atrial fibrillation. Patient with resultant lethargy, nausea, vomiting, headache, dysarthria, visual abnormalities, dizziness, dysphasia, and eye movement abnormalities. Patient was discharged to inpatient rehabilitation on aspirin 81 mg and Plavix 75 mg orally daily for secondary stroke prevention.  Patient is seen in office today much improved. He continues on aspirin 81 mg and Plavix 75 mg daily. Patient denies medication side effects, with no signs of bleeding. Patient has completed inpatient therapy. He is independent with his dressing and bathing. He uses a cane to ambulate when away from home. Denies pain, paresthesias, recent falls and headaches. He states his blood pressure is well controlled but his sugars have ranged from 90s to 130s fasting .   UPDATE 07/21/13 (LL):  Patient comes to office for follow up since last 3 month visit.  Patient states he is the same, wife states his hearing is worse and he gets short of breath with any  activity.  Had recent PFT's tested at [facility], results pending.  He continues on aspirin 81 mg and Plavix 75 mg daily. Patient denies medication side effects, with no signs of bleeding, mild bruising on bilateral arms. Patient wears HomeAlert monitor around neck that sends alert to family if he has a fall.  He reports no falls at home.  Blood sugars doing well per report, averaging 120-140 in am.  REVIEW OF SYSTEMS: Full 14 system review of systems performed and notable only for constitutional: fatigue Eyes: Blurred vision, double vision, loss of vision  Hematology/Lymph: easy bruising respiratory: short of breath, cough, ear/nose/throat: Hearing loss, ringing in ears neurological: Memory loss, weakness,slurred speech sleep: snoring psychiatric: decreased energy.  ALLERGIES: Allergies  Allergen Reactions  . Lorazepam   . Morphine And Related     HOME MEDICATIONS: Outpatient Prescriptions Prior to Visit  Medication Sig Dispense Refill  . carvedilol (COREG) 12.5 MG tablet Take 1 tablet (12.5 mg total) by mouth 2 (two) times daily.  180 tablet  3  . cholecalciferol (VITAMIN D) 1000 UNITS tablet Take 1,000 Units by mouth daily.      Marland Kitchen doxazosin (CARDURA) 4 MG tablet Take 4 mg by mouth at bedtime.        . furosemide (LASIX) 40 MG tablet Take 1 tablet (40 mg total) by mouth 2 (two) times daily with breakfast and lunch.  60 tablet  1  . insulin aspart (NOVOLOG) 100 UNIT/ML injection Inject 0-9 Units into the skin 3 (three) times daily with meals.  1 vial    . insulin glargine (LANTUS) 100 UNIT/ML injection Inject 0.3 mLs (30 Units total) into the  skin at bedtime.  10 mL    . nitroGLYCERIN (NITROSTAT) 0.4 MG SL tablet Place 0.4 mg under the tongue every 5 (five) minutes as needed.        Marland Kitchen omeprazole (PRILOSEC) 20 MG capsule Take 20 mg by mouth daily.       . polyethylene glycol (MIRALAX) packet Take 17 g by mouth daily as needed.  14 each  0  . ranolazine (RANEXA) 500 MG 12 hr tablet Take 500 mg  by mouth 2 (two) times daily.        Marland Kitchen senna-docusate (SENOKOT-S) 8.6-50 MG per tablet Take 1 tablet by mouth 2 (two) times daily.      . simvastatin (ZOCOR) 40 MG tablet Take 20 mg by mouth at bedtime.       Marland Kitchen aspirin 81 MG tablet Take 81 mg by mouth daily.      . clopidogrel (PLAVIX) 75 MG tablet Take 75 mg by mouth daily.       No facility-administered medications prior to visit.    PAST MEDICAL HISTORY: Past Medical History  Diagnosis Date  . RBBB (right bundle branch block)   . AAA (abdominal aortic aneurysm)   . AMI (acute myocardial infarction)   . Diabetes mellitus   . Coronary artery disease   . Pacemaker   . Defibrillator activation   . Hypertension   . Heart disease   . Heart attack   . Wears dentures   . Hyperlipidemia   . Shortness of breath   . Kidney disease   . Hyperpotassemia   . Edema     BLE  . Systolic CHF   . Stroke     PAST SURGICAL HISTORY: Past Surgical History  Procedure Laterality Date  . Coronary artery bypass graft  1990  . Icd  2011  . Hernia repair      umbilical and RIH  . Aneurysm coiling    . Rotator cuff repair      bilateral  . Lasik      with lens implant.     FAMILY HISTORY: Family History  Problem Relation Age of Onset  . Heart disease Mother   . Diabetes Mother   . Cancer Brother     colon  . Heart disease Brother     heart attack  . Stroke Other   . Hypertension Other     SOCIAL HISTORY: History   Social History  . Marital Status: Married    Spouse Name: Jason Walker    Number of Children: 3  . Years of Education: 4th   Occupational History  .     Social History Main Topics  . Smoking status: Former Smoker -- 30 years    Types: Cigarettes    Quit date: 11/28/1987  . Smokeless tobacco: Never Used  . Alcohol Use: Yes  . Drug Use: No  . Sexual Activity: Not on file   Other Topics Concern  . Not on file   Social History Narrative   Patient lives at home with spouse.   Caffeine Use:  2 cups daily      PHYSICAL EXAM  Filed Vitals:   07/21/13 1345  BP: 148/62  Pulse: 66  Temp: 97.7 F (36.5 C)  TempSrc: Oral  Height: 5' 5.5" (1.664 m)  Weight: 199 lb (90.266 kg)   Body mass index is 32.6 kg/(m^2).    GENERAL EXAM: Patient is in no distress, well developed and well groomed.  HEAD: Symmetric facial features.  EARS,  NOSE, and THROAT: Normal.  NECK: Supple, no JVD  RESPIRATORY: Lungs CTA.  CARDIOVASCULAR:  regular rate and rhythm.  Soft left Carotid bruit. SKIN: No rash, mild arm bruising.  AV fistula right arm for future dialysis   NEUROLOGIC:  MENTAL STATUS: awake, alert and oriented to person, place, CANNOT STATE MONTH  CRANIAL NERVE: pupils equal and reactive to light, visual fields full to confrontation, extraocular muscles intact, no nystagmus, facial sensation and strength symmetric, uvula midline, shoulder shrug symmetric, tongue midline. MILD DYSARTHRIA. VERY HARD OF HEARING BILATERALLY. MOTOR: normal bulk and tone, full strength in the BUE, BLE, No arm drift  SENSORY: normal and symmetric to light touch, pinprick, temperature, vibration COORDINATION: finger-nose-finger, fine finger movements normal. Orbits right or left approximately  REFLEXES: deep tendon reflexes present and symmetric 1+  GAIT/STATION: narrow based gait; able to walk on toes, heels. DIFFICULTY WITH TANDEM WALK. romberg is negative. Has cane, but not using assistive device.   DIAGNOSTIC DATA (LABS, IMAGING, TESTING) - I reviewed patient records, labs, notes, testing and imaging myself where available.  Lab Results  Component Value Date   WBC 9.3 02/17/2013   HGB 12.2* 02/17/2013   HCT 35.7* 02/17/2013   MCV 85.8 02/17/2013   PLT 115* 02/17/2013      Component Value Date/Time   NA 134* 02/20/2013 0518   K 3.9 02/20/2013 0518   CL 96 02/20/2013 0518   CO2 31 02/20/2013 0518   GLUCOSE 162* 02/20/2013 0518   BUN 30* 02/20/2013 0518   CREATININE 1.98* 02/20/2013 0518   CALCIUM 9.0 02/20/2013 0518    PROT 7.0 02/17/2013 0740   ALBUMIN 3.3* 02/17/2013 0740   AST 22 02/17/2013 0740   ALT 14 02/17/2013 0740   ALKPHOS 55 02/17/2013 0740   BILITOT 0.6 02/17/2013 0740   GFRNONAA 30* 02/20/2013 0518   GFRAA 35* 02/20/2013 0518   Lab Results  Component Value Date   CHOL 168 02/10/2013   HDL 35* 02/10/2013   LDLCALC 103* 02/10/2013   TRIG 150* 02/10/2013   CHOLHDL 4.8 02/10/2013   Lab Results  Component Value Date   HGBA1C 10.0* 02/10/2013   No results found for this basename: VITAMINB12   No results found for this basename: TSH   CT ANGIOGRAM HEAD 02/10/13 Moderate to large left cerebellar infarct with local mass effect.  Presently no associated hemorrhage or hydrocephalus currently.  Although there is narrowing of portions of the vertebral arteries and basilar artery, no high-grade stenosis or thrombus identified.  Portions of the PICAs are noted bilaterally.  Portions of the AICA visualized bilaterally. Left AICA only noted proximally with poor delineation of the distal branches.  Mild to moderate narrowing of the cavernous segment of the internal carotid artery bilaterally.  CAROTID DOPPLER STUDY 02/10/13 No significant extracranial carotid artery stenosis demonstrated. Atypical vertebral flow bilaterally suggesting proximal obstruction.  CT HEAD WITHOUT CONTRAST 02/13/13 Infarcts in the left cerebellum and left pons with effacement of the fourth ventricle. No significant interval change. No acute intracranial hemorrhage.   ASSESSMENT AND PLAN 79 y.o. 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 Infarct felt to be embolic secondary to known atrial fibrillation. Not a Coumadin candidate secondary to history of previous GI bleeding in October 2013.   Recommend Jason. Burkhammer be re-evaluated by audiologist since hearing loss greater post-stroke.  He would  greatly benefit from hearing  aids but cost is an issue. Change to aspirin 325 mg orally every day secondary stroke prevention. Discontinue Plavix. Maintain strict control of hypertension with blood pressure goal below 130/90, diabetes with hemoglobin A1c goal below 6.5% and lipids with LDL cholesterol goal below 100 mg/dL. Followup in the future with me in 6 months.  LYNN LAM NP-C 07/21/2013, 2:50 PM  [facility] [address] [place], [place] [address] [phone]

## 2013-07-25 NOTE — Procedures (Signed)
NAMEKOLBEE, BOGUSZ                 ACCOUNT NO.:  [id number]  MEDICAL RECORD NO.:  [id number]  LOCATION:                                 FACILITY:  PHYSICIAN:  Dimple Bastyr L. Juanetta Gosling, M.D.DATE OF BIRTH:  09/16/1930  DATE OF PROCEDURE:  07/24/2013 DATE OF DISCHARGE:                           PULMONARY FUNCTION TEST   REASON FOR PULMONARY FUNCTION TESTING:  Shortness of breath.  1. Spirometry shows a moderate ventilatory defect with evidence of     airflow obstruction. 2. Lung volumes show mild reduction in total lung capacity suggesting     a restrictive abnormality. 3. DLCO is moderately reduced. 4. Airway resistance is elevated suggesting confirming the presence of     airflow obstruction. 5. There is significant bronchodilator improvement. 6. This study is consistent with COPD considering the patient's     smoking history.     Kaylenn Civil L. Juanetta Gosling, M.D.     ELH/MEDQ  D:  07/24/2013  T:  07/25/2013  Job:  [id number]  cc:   Doylene Canning. Ladona Ridgel, MD

## 2013-08-07 LAB — PULMONARY FUNCTION TEST

## 2013-08-26 ENCOUNTER — Telehealth: Payer: Self-pay | Admitting: *Deleted

## 2013-08-26 NOTE — Telephone Encounter (Signed)
PT IS CALLING FOR TEST RESULTS DR Corena Pilgrim

## 2013-08-26 NOTE — Telephone Encounter (Signed)
Please advise pt PFT result instructions  performed 08-07-13

## 2013-09-07 NOTE — Telephone Encounter (Signed)
No official report that I can find. I have reviewed the results and it looks like he has moderate to severe COPD. This would explain dyspnea. Final report to follow.

## 2013-09-08 NOTE — Telephone Encounter (Signed)
Unable to leave a voicemail per noted patients phone line has not been set up for voicemail capability at this time.  

## 2013-09-19 NOTE — Telephone Encounter (Signed)
.  left message to have patient return my call.  

## 2013-09-23 NOTE — Telephone Encounter (Signed)
.  left message to have patient return my call.  

## 2013-09-29 ENCOUNTER — Encounter: Payer: Self-pay | Admitting: *Deleted

## 2013-09-29 NOTE — Telephone Encounter (Signed)
Unable to leave a message, noted pt has upcoming apt and per inability to contact pt via telephone several times a letter has been mailed out to inform pt results to advise if any further questions to call office

## 2013-10-13 ENCOUNTER — Encounter: Payer: Self-pay | Admitting: Internal Medicine

## 2013-10-13 ENCOUNTER — Ambulatory Visit (INDEPENDENT_AMBULATORY_CARE_PROVIDER_SITE_OTHER): Payer: Medicare Other | Admitting: *Deleted

## 2013-10-13 DIAGNOSIS — I472 Ventricular tachycardia: Secondary | ICD-10-CM

## 2013-10-13 DIAGNOSIS — Z95810 Presence of automatic (implantable) cardiac defibrillator: Secondary | ICD-10-CM

## 2013-10-13 DIAGNOSIS — I5022 Chronic systolic (congestive) heart failure: Secondary | ICD-10-CM

## 2013-10-13 LAB — MDC_IDC_ENUM_SESS_TYPE_REMOTE
Brady Statistic AP VP Percent: 1.13 %
Brady Statistic AP VS Percent: 76.74 %
Brady Statistic AS VP Percent: 0.22 %
Brady Statistic AS VS Percent: 21.92 %
Brady Statistic RV Percent Paced: 1.35 %
Date Time Interrogation Session: 20141117072606
Lead Channel Impedance Value: 475 Ohm
Lead Channel Pacing Threshold Amplitude: 0.625 V
Lead Channel Pacing Threshold Pulse Width: 0.4 ms
Lead Channel Sensing Intrinsic Amplitude: 2.3 mV
Lead Channel Sensing Intrinsic Amplitude: 5.375 mV
Lead Channel Setting Pacing Amplitude: 2 V
Lead Channel Setting Sensing Sensitivity: 0.3 mV
Zone Setting Detection Interval: 270 ms
Zone Setting Detection Interval: 340 ms

## 2013-10-28 ENCOUNTER — Encounter: Payer: Self-pay | Admitting: *Deleted

## 2014-01-19 ENCOUNTER — Encounter: Payer: Medicare Other | Admitting: *Deleted

## 2014-01-21 ENCOUNTER — Ambulatory Visit (INDEPENDENT_AMBULATORY_CARE_PROVIDER_SITE_OTHER): Payer: Medicare Other | Admitting: Nurse Practitioner

## 2014-01-21 ENCOUNTER — Encounter: Payer: Self-pay | Admitting: Nurse Practitioner

## 2014-01-21 ENCOUNTER — Encounter (INDEPENDENT_AMBULATORY_CARE_PROVIDER_SITE_OTHER): Payer: Self-pay

## 2014-01-21 VITALS — BP 182/77 | HR 83 | Ht 67.5 in | Wt 208.0 lb

## 2014-01-21 DIAGNOSIS — I5022 Chronic systolic (congestive) heart failure: Secondary | ICD-10-CM

## 2014-01-21 DIAGNOSIS — I6350 Cerebral infarction due to unspecified occlusion or stenosis of unspecified cerebral artery: Secondary | ICD-10-CM

## 2014-01-21 DIAGNOSIS — I639 Cerebral infarction, unspecified: Secondary | ICD-10-CM

## 2014-01-21 NOTE — Patient Instructions (Signed)
Change to aspirin 325 mg orally every day secondary stroke prevention.  Maintain strict control of hypertension with blood pressure goal below 130/90, diabetes with hemoglobin A1c goal below 6.5% and lipids with LDL cholesterol goal below 100 mg/dL.  Followup in the future with me in 6 months.

## 2014-01-21 NOTE — Progress Notes (Signed)
PATIENT: Jason Walker DOB: Dec 04, 1929   REASON FOR VISIT: follow up for stroke HISTORY FROM: patient  HISTORY OF PRESENT ILLNESS: Jason Walker is a 80 year old male with known A. fib but not on anticoagulation due to previous life-threatening GI bleed, PVT status post ICD, CKD. Not a Coumadin candidate secondary to history of previous GI bleeding in October 2013 requiring 7 unit transfusions, at high risk for rebleeding per gastroenterologist. Patient was found on 02/09/2013 by wife in the morning unresponsive and he had been vomiting. He was taken to [facility] but transferred to [facility] due to lack of neurology coverage at [facility]. 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 Infarcts felt to be embolic secondary to known atrial fibrillation. Patient with resultant lethargy, nausea, vomiting, headache, dysarthria, visual abnormalities, dizziness, dysphasia, and eye movement abnormalities. Patient was discharged to inpatient rehabilitation on aspirin 81 mg and Plavix 75 mg orally daily for secondary stroke prevention.  Patient is seen in office today much improved. He continues on aspirin 81 mg and Plavix 75 mg daily. Patient denies medication side effects, with no signs of bleeding. Patient has completed inpatient therapy. He is independent with his dressing and bathing. He uses a cane to ambulate when away from home. Denies pain, paresthesias, recent falls and headaches. He states his blood pressure is well controlled but his sugars have ranged from 90s to 130s fasting .   UPDATE 07/21/13 (LL): Patient comes to office for follow up since last 3 month visit. Patient states he is the same, wife states his hearing is worse and he gets short of breath with any activity. Had recent PFT's tested at [facility], results pending. He continues on aspirin 81 mg and Plavix 75 mg  daily. Patient denies medication side effects, with no signs of bleeding, mild bruising on bilateral arms. Patient wears HomeAlert monitor around neck that sends alert to family if he has a fall. He reports no falls at home. Blood sugars doing well per report, averaging 120-140 in am.   UPDATE 01/21/14 (LL):  Patient returns to office for stroke follow up with his wife and 2 daughters.  He reports no new neurological or stroke like symptoms.  Since last visit he had PFT's which revealed moderately severe COPD. He wheezes frequently per wife, and has mild lower extremity edema. He tires easily with only walking.  REVIEW OF SYSTEMS: Full 14 system review of systems performed and notable only for constitutional: fatigue, activity change Eyes:  double vision Hematology/Lymph: easy bruising respiratory: short of breath, cough, wheezing ear/nose/throat: Hearing loss, facial swelling neurological: Memory loss, weakness,slurred speech sleep: snoring, daytime sleepiness, sleep talking  ALLERGIES: Allergies  Allergen Reactions  . Lorazepam   . Morphine And Related     HOME MEDICATIONS: Outpatient Prescriptions Prior to Visit  Medication Sig Dispense Refill  . aspirin EC 325 MG tablet Take 1 tablet (325 mg total) by mouth daily.  30 tablet  0  . carvedilol (COREG) 12.5 MG tablet Take 1 tablet (12.5 mg total) by mouth 2 (two) times daily.  180 tablet  3  . cholecalciferol (VITAMIN D) 1000 UNITS tablet Take 1,000 Units by mouth daily.      Marland Kitchen doxazosin (CARDURA) 4 MG tablet Take 4 mg by mouth at bedtime.        . furosemide (LASIX) 40 MG tablet Take 1 tablet (40 mg total) by  mouth 2 (two) times daily with breakfast and lunch.  60 tablet  1  . insulin aspart (NOVOLOG) 100 UNIT/ML injection Inject 0-9 Units into the skin 3 (three) times daily with meals.  1 vial    . insulin glargine (LANTUS) 100 UNIT/ML injection Inject 0.3 mLs (30 Units total) into the skin at bedtime.  10 mL    . nitroGLYCERIN (NITROSTAT)  0.4 MG SL tablet Place 0.4 mg under the tongue every 5 (five) minutes as needed.        Marland Kitchen. omeprazole (PRILOSEC) 20 MG capsule Take 20 mg by mouth daily.       . simvastatin (ZOCOR) 40 MG tablet Take 20 mg by mouth at bedtime.       . polyethylene glycol (MIRALAX) packet Take 17 g by mouth daily as needed.  14 each  0  . ranolazine (RANEXA) 500 MG 12 hr tablet Take 500 mg by mouth 2 (two) times daily.        Marland Kitchen. senna-docusate (SENOKOT-S) 8.6-50 MG per tablet Take 1 tablet by mouth 2 (two) times daily.       No facility-administered medications prior to visit.   PHYSICAL EXAM  Filed Vitals:   01/21/14 1340  BP: 182/77  Pulse: 83  Height: 5' 7.5" (1.715 m)  Weight: 208 lb (94.348 kg)   Body mass index is 32.08 kg/(m^2).  GENERAL EXAM: Patient is in no distress, well developed and well groomed.  HEAD: Symmetric facial features.  EARS, NOSE, and THROAT: Normal.  NECK: Supple, no JVD  RESPIRATORY: Lungs with wheezing throughout CARDIOVASCULAR: regular rate and rhythm. Soft left Carotid bruit.  SKIN: No rash, mild arm bruising.  AV fistula right upper arm for future dialysis, 1+ LE edema  NEUROLOGIC:  MENTAL STATUS: awake, alert and oriented to person, place, CANNOT STATE MONTH  CRANIAL NERVE: pupils equal and reactive to light, visual fields full to confrontation, extraocular muscles intact, no nystagmus, facial sensation and strength symmetric, uvula midline, shoulder shrug symmetric, tongue midline. MILD DYSARTHRIA. VERY HARD OF HEARING BILATERALLY.  MOTOR: normal bulk and tone, full strength in the BUE, BLE, No arm drift  SENSORY: normal and symmetric to light touch, pinprick, temperature, vibration  COORDINATION: finger-nose-finger, fine finger movements normal. Orbits right or left approximately  REFLEXES: deep tendon reflexes present and symmetric 1+  GAIT/STATION: narrow based gait; able to walk on toes, heels. DIFFICULTY WITH TANDEM WALK. romberg is negative. Has cane, but not  using assistive device.   ASSESSMENT AND PLAN Jason Walker is a 80 y.o. 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 Infarct felt to be embolic secondary to known atrial fibrillation. Not a Coumadin candidate secondary to history of previous GI bleeding in October 2013.  Now with worsening dyspnea, CHF vs. COPD.  PLAN: Continue aspirin 325 mg orally every day secondary stroke prevention. Maintain strict control of hypertension with blood pressure goal below 130/90, diabetes with hemoglobin A1c goal below 6.5% and lipids with LDL cholesterol goal below 100 mg/dL. Followup in the future in 6 months, to see Dr. Pearlean BrownieSethi next time.  Ronal FearLYNN E. Patsye Sullivant, MSN, NP-C 01/21/2014, 2:04 PM [facility] [address], [address] [place], [place] [address] [phone]  Note: This document was prepared with digital dictation and possible smart phrase technology. Any transcriptional errors that result from this process are unintentional.

## 2014-02-03 ENCOUNTER — Encounter: Payer: Self-pay | Admitting: *Deleted

## 2014-02-06 ENCOUNTER — Ambulatory Visit (INDEPENDENT_AMBULATORY_CARE_PROVIDER_SITE_OTHER): Payer: Medicare Other | Admitting: *Deleted

## 2014-02-06 ENCOUNTER — Encounter: Payer: Self-pay | Admitting: Internal Medicine

## 2014-02-06 DIAGNOSIS — Z95810 Presence of automatic (implantable) cardiac defibrillator: Secondary | ICD-10-CM

## 2014-02-06 DIAGNOSIS — I5022 Chronic systolic (congestive) heart failure: Secondary | ICD-10-CM

## 2014-02-19 LAB — MDC_IDC_ENUM_SESS_TYPE_REMOTE
Brady Statistic AP VS Percent: 81.23 %
Brady Statistic AS VS Percent: 15.96 %
Brady Statistic RV Percent Paced: 2.81 %
HIGH POWER IMPEDANCE MEASURED VALUE: 39 Ohm
HighPow Impedance: 49 Ohm
Lead Channel Impedance Value: 475 Ohm
Lead Channel Pacing Threshold Amplitude: 0.625 V
Lead Channel Pacing Threshold Pulse Width: 0.4 ms
Lead Channel Pacing Threshold Pulse Width: 0.4 ms
Lead Channel Sensing Intrinsic Amplitude: 1.375 mV
Lead Channel Sensing Intrinsic Amplitude: 3.75 mV
Lead Channel Setting Pacing Amplitude: 2 V
Lead Channel Setting Pacing Amplitude: 2.5 V
Lead Channel Setting Pacing Pulse Width: 0.4 ms
Lead Channel Setting Sensing Sensitivity: 0.3 mV
MDC IDC MSMT BATTERY VOLTAGE: 3.06 V
MDC IDC MSMT LEADCHNL RA PACING THRESHOLD AMPLITUDE: 0.875 V
MDC IDC MSMT LEADCHNL RA SENSING INTR AMPL: 1.375 mV
MDC IDC MSMT LEADCHNL RV IMPEDANCE VALUE: 437 Ohm
MDC IDC MSMT LEADCHNL RV SENSING INTR AMPL: 3.75 mV
MDC IDC SESS DTM: 20150313170543
MDC IDC SET ZONE DETECTION INTERVAL: 340 ms
MDC IDC STAT BRADY AP VP PERCENT: 1.18 %
MDC IDC STAT BRADY AS VP PERCENT: 1.63 %
MDC IDC STAT BRADY RA PERCENT PACED: 82.41 %
Zone Setting Detection Interval: 270 ms
Zone Setting Detection Interval: 350 ms
Zone Setting Detection Interval: 450 ms

## 2014-02-25 ENCOUNTER — Encounter: Payer: Self-pay | Admitting: *Deleted

## 2014-03-24 ENCOUNTER — Telehealth: Payer: Self-pay | Admitting: *Deleted

## 2014-03-24 NOTE — Telephone Encounter (Signed)
Sps calling to let Dr Pearlean BrownieSethi know patient was in hospital in [facility] for TIA and 2 mini strokes.  Wanted to know if Dr Pearlean BrownieSethi needed to see patient before 8/25 appt.  Please advise

## 2014-03-30 NOTE — Telephone Encounter (Signed)
Agree work in sooner

## 2014-03-30 NOTE — Telephone Encounter (Signed)
Jason Walker w/Dr. Drue DunJames Milam's office called states pt needs to see Dr. Pearlean BrownieSethi soon as possible. Please call Jason Walker to schedule pt in sooner than Aug. Thanks

## 2014-03-31 NOTE — Telephone Encounter (Signed)
Appt. Scheduled sooner, confirmed with wife

## 2014-04-23 ENCOUNTER — Ambulatory Visit: Payer: Self-pay | Admitting: Nurse Practitioner

## 2014-05-06 ENCOUNTER — Encounter: Payer: Self-pay | Admitting: Neurology

## 2014-05-06 ENCOUNTER — Ambulatory Visit (INDEPENDENT_AMBULATORY_CARE_PROVIDER_SITE_OTHER): Payer: Medicare Other | Admitting: Neurology

## 2014-05-06 ENCOUNTER — Encounter (INDEPENDENT_AMBULATORY_CARE_PROVIDER_SITE_OTHER): Payer: Self-pay

## 2014-05-06 VITALS — BP 174/69 | HR 69 | Ht 67.5 in | Wt 199.0 lb

## 2014-05-06 DIAGNOSIS — E1140 Type 2 diabetes mellitus with diabetic neuropathy, unspecified: Secondary | ICD-10-CM | POA: Insufficient documentation

## 2014-05-06 DIAGNOSIS — I6350 Cerebral infarction due to unspecified occlusion or stenosis of unspecified cerebral artery: Secondary | ICD-10-CM

## 2014-05-06 DIAGNOSIS — R209 Unspecified disturbances of skin sensation: Secondary | ICD-10-CM

## 2014-05-06 MED ORDER — PREGABALIN 50 MG PO CAPS: 50.0000 mg | ORAL_CAPSULE | Freq: Two times a day (BID) | ORAL | Status: DC

## 2014-05-06 NOTE — Patient Instructions (Signed)
I had a long discussion with the patient, wife and daughter regarding his worsening paresthesias in the feet and recommend trial of Lyrica for treatment of neuropathic pain. Also check CT scan of the head and cannot o Doppler studies as he had 2 recent episodes of possible TIA/stroke. If brain imaging confirms a interval stroke may consider switching him to NOAC as risk of GI bleeding may be comparable to aspirin and Plavix which is currently taking but benefit for stroke prevention and atrial fibrillation is definitely higher. Return for followup in 2 months for call earlier if necessary  Diabetic Neuropathy Diabetic neuropathy is a nerve disease or nerve damage that is caused by diabetes mellitus. About half of all people with diabetes mellitus have some form of nerve damage. Nerve damage is more common in those who have had diabetes mellitus for many years and who generally have not had good control of their blood sugar (glucose) level. Diabetic neuropathy is a common complication of diabetes mellitus. There are three more common types of diabetic neuropathy and a fourth type that is less common and less understood:   Peripheral neuropathy This is the most common type of diabetic neuropathy. It causes damage to the nerves of the feet and legs first and then eventually the hands and arms.The damage affects the ability to sense touch.  Autonomic neuropathy This type causes damage to the autonomic nervous system, which controls the following functions:  Heartbeat.  Body temperature.  Blood pressure.  Urination.  Digestion.  Sweating.  Sexual function.  Focal neuropathy Focal neuropathy can be painful and unpredictable and occurs most often in older adults with diabetes mellitus. It involves a specific nerve or one area and often comes on suddenly. It usually does not cause long-term problems.  Radiculoplexus neuropathy Sometimes called lumbosacral radiculoplexus neuropathy, radiculoplexus  neuropathy affects the nerves of the thighs, hips, buttocks, or legs. It is more common in people with type 2 diabetes mellitus and in older men. It is characterized by debilitating pain, weakness, and atrophy, usually in the thigh muscles. CAUSES  The cause of peripheral, autonomic, and focal neuropathies is diabetes mellitus that is uncontrolled and high glucose levels. The cause of radiculoplexus neuropathy is unknown. However, it is thought to be caused by inflammation related to uncontrolled glucose levels. SIGNS AND SYMPTOMS  Peripheral Neuropathy Peripheral neuropathy develops slowly over time. When the nerves of the feet and legs no longer work there may be:   Burning, stabbing, or aching pain in the legs or feet.  Inability to feel pressure or pain in your feet. This can lead to:  Thick calluses over pressure areas.  Pressure sores.  Ulcers.  Foot deformities.  Reduced ability to feel temperature changes.  Muscle weakness. Autonomic Neuropathy The symptoms of autonomic neuropathy vary depending on which nerves are affected. Symptoms may include:  Problems with digestion, such as:  Feeling sick to your stomach (nausea).  Vomiting.  Bloating.  Constipation.  Diarrhea.  Abdominal pain.  Difficulty with urination. This occurs if you lose your ability to sense when your bladder is full. Problems include:  Urine leakage (incontinence).  Inability to empty your bladder completely (retention).  Rapid or irregular heartbeat (palpitations).  Blood pressure drops when you stand up (orthostatic hypotension). When you stand up you may feel:  Dizzy.  Weak.  Faint.  In men, inability to attain and maintain an erection.  In women, vaginal dryness and problems with decreased sexual desire and arousal.  Problems with body temperature  regulation.  Increased or decreased sweating. Focal Neuropathy  Abnormal eye movements or abnormal alignment of both  eyes.  Weakness in the wrist.  Foot drop. This results in an inability to lift the foot properly and abnormal walking or foot movement.  Paralysis on one side of your face (Bell palsy).  Chest or abdominal pain. Radiculoplexus Neuropathy  Sudden, severe pain in your hip, thigh, or buttocks.  Weakness and wasting of thigh muscles.  Difficulty rising from a seated position.  Abdominal swelling.  Unexplained weight loss (usually more than 10 lb [4.5 kg]). DIAGNOSIS  Peripheral Neuropathy Your senses may be tested. Sensory function testing can be done with:  A light touch using a monofilament.  A vibration with tuning fork.  A sharp sensation with a pin prick. Other tests that can help diagnose neuropathy are:  Nerve conduction velocity. This test checks the transmission of an electrical current through a nerve.  Electromyography. This shows how muscles respond to electrical signals transmitted by nearby nerves.  Quantitative sensory testing. This is used to assess how your nerves respond to vibrations and changes in temperature. Autonomic Neuropathy Diagnosis is often based on reported symptoms. Tell your health care provider if you experience:   Dizziness.   Constipation.   Diarrhea.   Inappropriate urination or inability to urinate.   Inability to get or maintain an erection.  Tests that may be done include:   Electrocardiography or Holter monitor. These are tests that can help show problems with the heart rate or heart rhythm.   An X-ray exam may be done. Focal Neuropathy Diagnosis is made based on your symptoms and what your health care provider finds during your exam. Other tests may be done. They may include:  Nerve conduction velocities. This checks the transmission of electrical current through a nerve.  Electromyography. This shows how muscles respond to electrical signals transmitted by nearby nerves.  Quantitative sensory testing. This test is  used to assess how your nerves respond to vibration and changes in temperature. Radiculoplexus Neuropathy  Often the first thing is to eliminate any other issue or problems that might be the cause, as there is no stick test for diagnosis.  X-ray exam of your spine and lumbar region.  Spinal tap to rule out cancer.  MRI to rule out other lesions. TREATMENT  Once nerve damage occurs, it cannot be reversed. The goal of treatment is to keep the disease or nerve damage from getting worse and affecting more nerve fibers. Controlling your blood glucose level is the key. Most people with radiculoplexus neuropathy see at least a partial improvement over time. You will need to keep your blood glucose and HbA1c levels in the target range determined by your health care provider. Things that help control blood glucose levels include:   Blood glucose monitoring.   Meal planning.   Physical activity.   Diabetes medicine.  Over time, maintaining lower blood glucose levels helps lessen symptoms. Sometimes, prescription pain medicine is needed. HOME CARE INSTRUCTIONS:  Do not smoke.  Keep your blood glucose level in the range that you and your health care provider have determined acceptable for you.  Keep your blood pressure level in the range that you and your health care provider have determined acceptable for you.  Eat a well-balanced diet.  Be active every day.  Check your feet every day. SEEK MEDICAL CARE IF:   You have burning, stabbing, or aching pain in the legs or feet.  You are unable to  feel pressure or pain in your feet.  You develop problems with digestion such as:  Nausea.  Vomiting.  Bloating.  Constipation.  Diarrhea.  Abdominal pain.  You have difficulty with urination, such as:  Incontinence.  Retention.  You have palpitations.  You develop orthostatic hypotension. When you stand up you may feel:  Dizzy.  Weak.  Faint.  You cannot attain and  maintain an erection (in men).  You have vaginal dryness and problems with decreased sexual desire and arousal (in women).  You have severe pain in your thighs, legs, or buttocks.  You have unexplained weight loss. Document Released: 01/22/2002 Document Revised: 09/03/2013 Document Reviewed: 04/24/2013 [facility] Patient Information 2014 [place], [place].

## 2014-05-06 NOTE — Progress Notes (Signed)
PATIENT: Jason Walker DOB: 05-Jul-1930   REASON FOR VISIT: follow up for stroke HISTORY FROM: patient  HISTORY OF PRESENT ILLNESS: [name] is a 80 year old male with known A. fib but not on anticoagulation due to previous life-threatening GI bleed, PVT status post ICD, CKD. Not a Coumadin candidate secondary to history of previous GI bleeding in October 2013 requiring 7 unit transfusions, at high risk for rebleeding per gastroenterologist. Patient was found on 02/09/2013 by wife in the morning unresponsive and he had been vomiting. [facility] was [facility] but [facility] due to lack of neurolog[facility] coverage at [facility]. 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 Infarcts felt to be embolic secondary to known atrial fibrillation. Patient with resultant lethargy, nausea, vomiting, headache, dysarthria, visual abnormalities, dizziness, dysphasia, and eye movement abnormalities. Patient was discharged to inpatient rehabilitation on aspirin 81 mg and Plavix 75 mg orally daily for secondary stroke prevention.  Patient is seen in office today much improved. He continues on aspirin 81 mg and Plavix 75 mg daily. Patient denies medication side effects, with no signs of bleeding. Patient has completed inpatient therapy. He is independent with his dressing and bathing. He uses a cane to ambulate when away from home. Denies pain, paresthesias, recent falls and headaches. He states his blood pressure is well controlled but his sugars have ranged from 90s to 130s fasting .   UPDATE 07/21/13 (LL): Patient comes to office for follow up since last 3 month visit. Patient states he is the same, wife states his hearing is worse and he gets short of breath with any activity. Had recent PFT's tested at [facility], results pending. He continues on aspirin 81 mg and Plavix 75 mg  daily. Patient denies medication side effects, with no signs of bleeding, mild bruising on bilateral arms. Patient wears HomeAlert monitor around neck that sends alert to family if he has a fall. He reports no falls at home. Blood sugars doing well per report, averaging 120-140 in am.   UPDATE 01/21/14 (LL):  Patient returns to office for stroke follow up with his wife and 2 daughters.  He reports no new neurological or stroke like symptoms.  Since last visit he had PFT's which revealed moderately severe COPD. He wheezes frequently per wife, and has mild lower extremity edema. He tires easily with only walking. Update 05/06/2014 : He returns for followup of her last visit 4 months ago. Is a complaint by the wife and daughter reports that he is has had 2 episodes of possible TIAs in May. First episode the wife noticed that he wasn't sleep and and she woke up he was disoriented confused and had severe the carpal speech and could barely be understood. EMS was called but his CBG was 66 mg percent and he improved after he got orange juice and this was thought to be hypoglycemia and he did not seek help. 2 weeks  later he had an episode when he did not feel right he felt like lying down he broken this right here Mr. [name] again this time he is taken [facility] where he was admitted. He had a CT scan done there apparently showed new stroke but I do not have any hospital records or imaging studies to review today. Patient had been on aspirin and Plavix was added. 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 He undergone endoscopy and hadn't cauterization done by gastroenterologist but was considered high risk for anticoagulation and hence was only an aspirin. Patient complains of worsening paresthesias in his feet from a stab neuropathy. She is not been on in the neuropathic pain medications but he has pedal edema and does not want to take any medication  which can cause weight gain. States his sugars are yet not well controlled fasting glucose ranging in the 160 range. He has a pacemaker and hence cannot have an MRI scan. REVIEW OF SYSTEMS: Full 14 system review of systems performed and notable only for  unexpected weight gain, and a lesion, blurred vision, cough, wheezing, shortness of breath, hearing loss, chest pain, leg swelling, cold intolerance, easy bruising, muscle cramps, walking difficulty and snoring. ALLERGIES: Allergies  Allergen Reactions  . Lorazepam   . Morphine Nausea Only  . Morphine And Related     HOME MEDICATIONS: Outpatient Prescriptions Prior to Visit  Medication Sig Dispense Refill  . amLODipine (NORVASC) 5 MG tablet Take 5 mg by mouth daily.      . carvedilol (COREG) 12.5 MG tablet Take 1 tablet (12.5 mg total) by mouth 2 (two) times daily.  180 tablet  3  . cholecalciferol (VITAMIN D) 1000 UNITS tablet Take 1,000 Units by mouth daily.      Marland Kitchen doxazosin (CARDURA) 4 MG tablet Take 4 mg by mouth at bedtime.        . insulin aspart (NOVOLOG) 100 UNIT/ML injection Inject 0-9 Units into the skin 3 (three) times daily with meals.  1 vial    . insulin glargine (LANTUS) 100 UNIT/ML injection Inject 0.3 mLs (30 Units total) into the skin at bedtime.  10 mL    . nitroGLYCERIN (NITROSTAT) 0.4 MG SL tablet Place 0.4 mg under the tongue every 5 (five) minutes as needed.        Marland Kitchen omeprazole (PRILOSEC) 20 MG capsule Take 20 mg by mouth daily.       . simvastatin (ZOCOR) 40 MG tablet Take 20 mg by mouth at bedtime.       Marland Kitchen aspirin EC 325 MG tablet Take 1 tablet (325 mg total) by mouth daily.  30 tablet  0  . furosemide (LASIX) 40 MG tablet Take 1 tablet (40 mg total) by mouth 2 (two) times daily with breakfast and lunch.  60 tablet  1   No facility-administered medications prior to visit.   PHYSICAL EXAM  Filed Vitals:   05/06/14 1548  BP: 174/69  Pulse: 69  Height: 5' 7.5" (1.715 m)  Weight: 199 lb (90.266 kg)   Body mass  index is 30.69 kg/(m^2).  GENERAL EXAM: Patient is in no distress, well developed and well groomed.  HEAD: Symmetric facial features.  EARS, NOSE, and THROAT: Normal. Hearing aide present NECK: Supple, no JVD  RESPIRATORY: Lungs with wheezing throughout CARDIOVASCULAR: regular rate and rhythm. Soft left Carotid bruit.  SKIN: No rash, mild arm bruising.  AV fistula right upper arm for future dialysis, 1+ LE edema  NEUROLOGIC:  MENTAL STATUS: awake, alert and oriented to person, place, CANNOT STATE MONTH  CRANIAL NERVE: pupils equal and reactive to light, visual fields full to confrontation, extraocular muscles intact, no nystagmus, facial sensation and strength symmetric, uvula midline, shoulder shrug symmetric, tongue midline. MILD DYSARTHRIA. VERY HARD OF HEARING BILATERALLY.  MOTOR: normal bulk and tone, full strength in the BUE, BLE, No arm drift  SENSORY: normal and symmetric to light touch, pinprick, temperature, vibration  COORDINATION: finger-nose-finger, fine finger movements normal. Orbits right or left approximately  REFLEXES: deep tendon reflexes present and symmetric 1+  GAIT/STATION: narrow based gait; able to walk on toes, heels. DIFFICULTY WITH TANDEM WALK. rhomberg is negative. Has cane, but not using assistive device.   ASSESSMENT AND PLAN Rodrigue Braband is a 80 y.o. 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 Infarct felt to be embolic secondary to known atrial fibrillation. Not a Coumadin candidate secondary to history of previous GI bleeding in October 2013.  Now with 2 episodes  In May 2015 of possible TIAs and worsening diabetic neuropathic pain PLAN: I had a long discussion with the patient, wife and daughter regarding his worsening paresthesias in the feet and recommend trial of Lyrica for treatment of neuropathic pain. Also check  CT scan of the head and cannot o Doppler studies as he had 2 recent episodes of possible TIA/stroke. If brain imaging confirms a interval stroke may consider switching him to NOAC as risk of GI bleeding may be comparable to aspirin and Plavix which is currently taking but benefit for stroke prevention and atrial fibrillation is definitely higher. Return for followup in 2 months for call earlier if necessary  Delia Heady, MD  05/06/2014, 10:49 PM [facility] [address] [place], [place] [address] [phone]  Note: This document was prepared with digital dictation and possible smart phrase technology. Any transcriptional errors that result from this process are unintentional.

## 2014-05-07 ENCOUNTER — Telehealth: Payer: Self-pay | Admitting: *Deleted

## 2014-05-07 NOTE — Telephone Encounter (Signed)
LM for Jason Walker in vascular to see if can coordinate CT and dopplers on same day for pt since lives in [place].

## 2014-05-11 ENCOUNTER — Telehealth: Payer: Self-pay | Admitting: Cardiology

## 2014-05-11 ENCOUNTER — Ambulatory Visit (INDEPENDENT_AMBULATORY_CARE_PROVIDER_SITE_OTHER): Payer: Medicare Other | Admitting: *Deleted

## 2014-05-11 ENCOUNTER — Encounter: Payer: Self-pay | Admitting: Internal Medicine

## 2014-05-11 DIAGNOSIS — I5022 Chronic systolic (congestive) heart failure: Secondary | ICD-10-CM

## 2014-05-11 DIAGNOSIS — Z95810 Presence of automatic (implantable) cardiac defibrillator: Secondary | ICD-10-CM

## 2014-05-11 LAB — MDC_IDC_ENUM_SESS_TYPE_REMOTE
Brady Statistic AP VP Percent: 0.7 %
Brady Statistic AP VS Percent: 93.1 %
Brady Statistic AS VP Percent: 1.3 %
Brady Statistic AS VS Percent: 4.9 %
HIGH POWER IMPEDANCE MEASURED VALUE: 39 Ohm
Lead Channel Impedance Value: 437 Ohm
Lead Channel Impedance Value: 437 Ohm
Lead Channel Sensing Intrinsic Amplitude: 1.3 mV
Lead Channel Setting Pacing Amplitude: 2 V
Lead Channel Setting Pacing Pulse Width: 0.4 ms
MDC IDC MSMT BATTERY VOLTAGE: 3.02 V
MDC IDC MSMT LEADCHNL RV PACING THRESHOLD AMPLITUDE: 0.5 V
MDC IDC MSMT LEADCHNL RV PACING THRESHOLD PULSEWIDTH: 0.4 ms
MDC IDC MSMT LEADCHNL RV SENSING INTR AMPL: 4 mV
MDC IDC SET LEADCHNL RV PACING AMPLITUDE: 2.5 V
MDC IDC SET LEADCHNL RV SENSING SENSITIVITY: 0.3 mV
MDC IDC SET ZONE DETECTION INTERVAL: 340 ms
MDC IDC SET ZONE DETECTION INTERVAL: 350 ms
MDC IDC SET ZONE DETECTION INTERVAL: 450 ms
Zone Setting Detection Interval: 270 ms

## 2014-05-11 NOTE — Telephone Encounter (Signed)
Spoke with pt wife and confirmed remote transmission of device.

## 2014-05-12 NOTE — Progress Notes (Signed)
Remote ICD transmission.   

## 2014-05-13 ENCOUNTER — Ambulatory Visit (INDEPENDENT_AMBULATORY_CARE_PROVIDER_SITE_OTHER): Payer: Medicare Other

## 2014-05-13 ENCOUNTER — Ambulatory Visit
Admission: RE | Admit: 2014-05-13 | Discharge: 2014-05-13 | Disposition: A | Discharge status: Home / Self Care | Payer: Medicare Other | Source: Ambulatory Visit | Attending: Neurology | Admitting: Neurology

## 2014-05-13 ENCOUNTER — Ambulatory Visit (INDEPENDENT_AMBULATORY_CARE_PROVIDER_SITE_OTHER): Payer: Self-pay

## 2014-05-13 DIAGNOSIS — Z0289 Encounter for other administrative examinations: Secondary | ICD-10-CM

## 2014-05-13 DIAGNOSIS — R209 Unspecified disturbances of skin sensation: Secondary | ICD-10-CM

## 2014-05-13 DIAGNOSIS — I6350 Cerebral infarction due to unspecified occlusion or stenosis of unspecified cerebral artery: Secondary | ICD-10-CM

## 2014-05-19 ENCOUNTER — Encounter: Payer: Self-pay | Admitting: Cardiology

## 2014-05-26 ENCOUNTER — Telehealth: Payer: Self-pay | Admitting: Neurology

## 2014-05-26 NOTE — Telephone Encounter (Signed)
Called pt and spoke with pt's wife [place] and she stated that after the pt had the test that Jason Walker ordered that the pt was supposed see Jason Walker on 07/21/14. I explained to the wife that Jason Walker's schedule changed and that is why pt appt was pushed out. Wife wanted to know the results of CT scan, US TCD and US Carotid and to know if Jason Walker was ok with seeing pt on 12/30/14. Please advise

## 2014-05-26 NOTE — Telephone Encounter (Signed)
Calling because she is upset her husbands appointment got pushed out to February and wants him seen sooner than that.

## 2014-06-10 ENCOUNTER — Telehealth: Payer: Self-pay

## 2014-06-10 MED ORDER — GABAPENTIN 300 MG PO CAPS: 300.0000 mg | ORAL_CAPSULE | Freq: Two times a day (BID) | ORAL | Status: DC

## 2014-06-10 NOTE — Telephone Encounter (Signed)
I will call in a prescription for the gabapentin.

## 2014-06-10 NOTE — Telephone Encounter (Signed)
[employer] sent us a fax saying the patient would like to know if Lyrica could be changed to something less expensive to treat his neuropathic pain. Pharmacy indicates Gabapentin would be a much lower co-pay.  Dr Pearlean BrownieSethi is out of the office, [facility].  Please advise.  Thank you.

## 2014-06-18 ENCOUNTER — Telehealth: Payer: Self-pay | Admitting: Neurology

## 2014-06-18 NOTE — Telephone Encounter (Signed)
Patient's wife calling upset about her husband's followup appointment being rescheduled for next Feb as they know someone who called and got an appointment the next day with Dr. Pearlean BrownieSethi. They are requesting a call back for a sooner apt. Please call.

## 2014-06-18 NOTE — Telephone Encounter (Signed)
Called pt and spoke with pt's wife [place] to set up an appt for the pt on 07/02/14 with Dr. Pearlean BrownieSethi, pt was resch from 07/23/14, Dr. Pearlean BrownieSethi not in the office. I advised the wife that if the pt has any other problems, questions or concerns to call the office. Wife verbalized understanding.

## 2014-06-26 ENCOUNTER — Telehealth: Payer: Self-pay | Admitting: *Deleted

## 2014-06-26 NOTE — Telephone Encounter (Signed)
I called and gave the results of dopplers( carotid and tcd and CT head) to her.  Pt has appt on 07-02-14 and if has questions will address then.  (mild L ICA narrowing in neck, no need for surgery, needs f/u in 6 months for carotid, TCD mild narrowing of the tiny vessels in the brain.  No major blockages. CT head shows some remote age strokes, moderate SVD, mild generalized atrophy, overall expected changes compared with CT head dated 02-13-13.  Wife verbalized understanding.

## 2014-07-02 ENCOUNTER — Ambulatory Visit (INDEPENDENT_AMBULATORY_CARE_PROVIDER_SITE_OTHER): Payer: Medicare Other | Admitting: Neurology

## 2014-07-02 ENCOUNTER — Encounter: Payer: Self-pay | Admitting: Neurology

## 2014-07-02 VITALS — BP 120/66 | HR 87 | Ht 67.5 in | Wt 200.8 lb

## 2014-07-02 DIAGNOSIS — I6350 Cerebral infarction due to unspecified occlusion or stenosis of unspecified cerebral artery: Secondary | ICD-10-CM

## 2014-07-02 DIAGNOSIS — E1149 Type 2 diabetes mellitus with other diabetic neurological complication: Secondary | ICD-10-CM

## 2014-07-02 NOTE — Patient Instructions (Signed)
I had a long discussion with the patient, wife and daughter regarding his stroke, risk factors, risk factor modification as well as diabetic neuropathy and answered questions. I advised him fall precautions. Continue gabapentin for neuropathic pain.Continue aspirin 81 mg orally every day and clopidogrel 75 mg orally every day  for secondary stroke prevention for atrial fibrillation and maintain strict control of hypertension with blood pressure goal below 130/90, diabetes with hemoglobin A1c goal below 6.5% and lipids with LDL cholesterol goal below 100 mg/dL. Followup in the future with me in  6 months  Fall Prevention and Home Safety Falls cause injuries and can affect all age groups. It is possible to use preventive measures to significantly decrease the likelihood of falls. There are many simple measures which can make your home safer and prevent falls. OUTDOORS  Repair cracks and edges of walkways and driveways.  Remove high doorway thresholds.  Trim shrubbery on the main path into your home.  Have good outside lighting.  Clear walkways of tools, rocks, debris, and clutter.  Check that handrails are not broken and are securely fastened. Both sides of steps should have handrails.  Have leaves, snow, and ice cleared regularly.  Use sand or salt on walkways during winter months.  In the garage, clean up grease or oil spills. BATHROOM  Install night lights.  Install grab bars by the toilet and in the tub and shower.  Use non-skid mats or decals in the tub or shower.  Place a plastic non-slip stool in the shower to sit on, if needed.  Keep floors dry and clean up all water on the floor immediately.  Remove soap buildup in the tub or shower on a regular basis.  Secure bath mats with non-slip, double-sided rug tape.  Remove throw rugs and tripping hazards from the floors. BEDROOMS  Install night lights.  Make sure a bedside light is easy to reach.  Do not use oversized  bedding.  Keep a telephone by your bedside.  Have a firm chair with side arms to use for getting dressed.  Remove throw rugs and tripping hazards from the floor. KITCHEN  Keep handles on pots and pans turned toward the center of the stove. Use back burners when possible.  Clean up spills quickly and allow time for drying.  Avoid walking on wet floors.  Avoid hot utensils and knives.  Position shelves so they are not too high or low.  Place commonly used objects within easy reach.  If necessary, use a sturdy step stool with a grab bar when reaching.  Keep electrical cables out of the way.  Do not use floor polish or wax that makes floors slippery. If you must use wax, use non-skid floor wax.  Remove throw rugs and tripping hazards from the floor. STAIRWAYS  Never leave objects on stairs.  Place handrails on both sides of stairways and use them. Fix any loose handrails. Make sure handrails on both sides of the stairways are as long as the stairs.  Check carpeting to make sure it is firmly attached along stairs. Make repairs to worn or loose carpet promptly.  Avoid placing throw rugs at the top or bottom of stairways, or properly secure the rug with carpet tape to prevent slippage. Get rid of throw rugs, if possible.  Have an electrician put in a light switch at the top and bottom of the stairs. OTHER FALL PREVENTION TIPS  Wear low-heel or rubber-soled shoes that are supportive and fit well. Wear closed  toe shoes.  When using a stepladder, make sure it is fully opened and both spreaders are firmly locked. Do not climb a closed stepladder.  Add color or contrast paint or tape to grab bars and handrails in your home. Place contrasting color strips on first and last steps.  Learn and use mobility aids as needed. Install an electrical emergency response system.  Turn on lights to avoid dark areas. Replace light bulbs that burn out immediately. Get light switches that  glow.  Arrange furniture to create clear pathways. Keep furniture in the same place.  Firmly attach carpet with non-skid or double-sided tape.  Eliminate uneven floor surfaces.  Select a carpet pattern that does not visually hide the edge of steps.  Be aware of all pets. OTHER HOME SAFETY TIPS  Set the water temperature for 120 F (48.8 C).  Keep emergency numbers on or near the telephone.  Keep smoke detectors on every level of the home and near sleeping areas. Document Released: 11/03/2002 Document Revised: 05/14/2012 Document Reviewed: 02/02/2012 [facility] Patient Information 2015 [place]. This information is not intended to replace advice given to you by your health care provider. Make sure you discuss any questions you have with your health care provider.

## 2014-07-02 NOTE — Progress Notes (Signed)
PATIENT: Jason Walker DOB: 12-30-1929   REASON FOR VISIT: follow up for stroke HISTORY FROM: patient  HISTORY OF PRESENT ILLNESS: [name] is a 80 year old male with known A. fib but not on anticoagulation due to previous life-threatening GI bleed, PVT status post ICD, CKD. Not a Coumadin candidate secondary to history of previous GI bleeding in October 2013 requiring 7 unit transfusions, at high risk for rebleeding per gastroenterologist. Patient was found on 02/09/2013 by wife in the morning unresponsive and he had been vomiting. [facility]. 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 Infarcts felt to be embolic secondary to known atrial fibrillation. Patient with resultant lethargy, nausea, vomiting, headache, dysarthria, visual abnormalities, dizziness, dysphasia, and eye movement abnormalities. Patient was discharged to inpatient rehabilitation on aspirin 81 mg and Plavix 75 mg orally daily for secondary stroke prevention.  Patient is seen in office today much improved. He continues on aspirin 81 mg and Plavix 75 mg daily. Patient denies medication side effects, with no signs of bleeding. Patient has completed inpatient therapy. He is independent with his dressing and bathing. He uses a cane to ambulate when away from home. Denies pain, paresthesias, recent falls and headaches. He states his blood pressure is well controlled but his sugars have ranged from 90s to 130s fasting .   UPDATE 07/21/13 (LL): Patient comes to office for follow up since last 3 month visit. Patient states he is the same, wife states his hearing is worse and he gets short of breath with any activity. Had recent PFT's tested at [facility], results pending. He continues on aspirin 81 mg and Plavix 75 mg  daily. Patient denies medication side effects, with no signs of bleeding, mild bruising on bilateral arms. Patient wears HomeAlert monitor around neck that sends alert to family if he has a fall. He reports no falls at home. Blood sugars doing well per report, averaging 120-140 in am.   UPDATE 01/21/14 (LL):  Patient returns to office for stroke follow up with his wife and 2 daughters.  He reports no new neurological or stroke like symptoms.  Since last visit he had PFT's which revealed moderately severe COPD. He wheezes frequently per wife, and has mild lower extremity edema. He tires easily with only walking. Update 05/06/2014 : He returns for followup of her last visit 4 months ago. Is a complaint by the wife and daughter reports that he is has had 2 episodes of possible TIAs in May. First episode the wife noticed that he wasn't sleep and and she woke up he was disoriented confused and had severe the carpal speech and could barely be understood. EMS was called but his CBG was 66 mg percent and he improved after he got orange juice and this was thought to be hypoglycemia and he did not seek help. 2 weeks  later he had an episode when he did not feel right he felt like lying down he broken this right here Mr. [name] again this time he is [facility] he was [facility]. He had a CT scan done there apparently showed new stroke but I do not have any hospital records or imaging studies to review today. Patient had been on aspirin and Plavix was added. 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 He undergone endoscopy and hadn't cauterization done by gastroenterologist but was considered high risk for anticoagulation and hence was only an aspirin. Patient complains of worsening paresthesias in his feet from a stab neuropathy. She is not been on in the neuropathic pain medications but he has pedal edema and does not want to take any medication  which can cause weight gain. States his sugars are yet not well controlled fasting glucose ranging in the 160 range. He has a pacemaker and hence cannot have an MRI scan. Update 07/02/2014 : He returns for follow up after last visit 2 months ago. He reports significant improvement in his lower extremity pain after starting gabapentin which he takes twice daily. He continues to have poor balance but he has had no falls. He states he feels like walking drunk  He has to be careful particularly while climbing steps or going down steps. His diabetes control continues to be fluctuating. He remains on aspirin and Plavix and has minor bruising. He had a brief episode of epistaxis several weeks ago. He had carotid ultrasound on 05/13/14 which showed mild 50-69% proximal and mid left ICA stenosis and bilateral proximal to this was unchanged. After Doppler studies showed diffuse increase and ulcer cavity in that his sister history of mild atherosclerosis. CT scan of the head showed no acute abnormality. Bilateral old basal ganglia and cerebellar infarcts were noted.   REVIEW OF SYSTEMS: Full 14 system review of systems performed and notable only for  unexpected weight gain, and a lesion, blurred vision, cough, wheezing, shortness of breath, hearing loss, chest pain, leg swelling, cold intolerance, easy bruising, muscle cramps, walking difficulty and snoring. ALLERGIES: Allergies  Allergen Reactions  . Lorazepam   . Morphine Nausea Only  . Morphine And Related     HOME MEDICATIONS: Outpatient Prescriptions Prior to Visit  Medication Sig Dispense Refill  . amLODipine (NORVASC) 5 MG tablet Take 5 mg by mouth daily.      Marland Kitchen aspirin 81 MG tablet Take 81 mg by mouth daily.      . carvedilol (COREG) 12.5 MG tablet Take 1 tablet (12.5 mg total) by mouth 2 (two) times daily.  180 tablet  3  . cholecalciferol (VITAMIN D) 1000 UNITS tablet Take 1,000 Units by mouth daily.      . clopidogrel (PLAVIX) 75 MG tablet Take 75  tablets by mouth daily.      Marland Kitchen doxazosin (CARDURA) 4 MG tablet Take 4 mg by mouth at bedtime.        . furosemide (LASIX) 40 MG tablet Take 80 mg by mouth 2 (two) times daily with breakfast and lunch.      . gabapentin (NEURONTIN) 300 MG capsule Take 1 capsule (300 mg total) by mouth 2 (two) times daily.  60 capsule  3  . insulin aspart (NOVOLOG) 100 UNIT/ML injection Inject 0-9 Units into the skin 3 (three) times daily with meals.  1 vial    . insulin glargine (LANTUS) 100 UNIT/ML injection Inject 0.3 mLs (30 Units total) into the skin at bedtime.  10 mL    . levothyroxine (SYNTHROID, LEVOTHROID) 50 MCG tablet Take 50 tablets by mouth daily before breakfast.      . nitroGLYCERIN (NITROSTAT) 0.4 MG SL tablet Place 0.4 mg under the tongue every 5 (five) minutes as needed.        Marland Kitchen omeprazole (PRILOSEC) 20 MG capsule Take 20 mg by mouth daily.       Marland Kitchen  simvastatin (ZOCOR) 40 MG tablet Take 20 mg by mouth at bedtime.        No facility-administered medications prior to visit.   PHYSICAL EXAM  Filed Vitals:   07/02/14 0940  BP: 120/66  Pulse: 87  Height: 5' 7.5" (1.715 m)  Weight: 91.082 kg (200 lb 12.8 oz)   Body mass index is 30.97 kg/(m^2).  GENERAL EXAM: Patient is in no distress, well developed and well groomed.  HEAD: Symmetric facial features.  EARS, NOSE, and THROAT: Normal. Hearing aide present NECK: Supple, no JVD  RESPIRATORY: Lungs with wheezing throughout CARDIOVASCULAR: regular rate and rhythm. Soft left Carotid bruit.  SKIN: No rash, mild arm bruising.  AV fistula right upper arm for future dialysis, 1+ LE edema  NEUROLOGIC:  MENTAL STATUS: awake, alert and oriented to person, place, CANNOT STATE MONTH  CRANIAL NERVE: pupils equal and reactive to light, visual fields full to confrontation, extraocular muscles intact, no nystagmus, facial sensation and strength symmetric, uvula midline, shoulder shrug symmetric, tongue midline. MILD DYSARTHRIA. VERY HARD OF HEARING  BILATERALLY.  MOTOR: normal bulk and tone, full strength in the BUE, BLE, No arm drift  SENSORY: normal and symmetric to light touch, pinprick, temperature, vibration  COORDINATION: finger-nose-finger, fine finger movements normal. Orbits right or left approximately  REFLEXES: deep tendon reflexes present and symmetric 1+  GAIT/STATION: narrow based gait; able to walk on toes, heels. DIFFICULTY WITH TANDEM WALK. rhomberg is negative. Has cane, but not using assistive device.   ASSESSMENT AND PLAN Tejuan Gholson is a 80 y.o. 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 Infarct felt to be embolic secondary to known atrial fibrillation. Not a Coumadin candidate secondary to history of previous GI bleeding in October 2013.  Now with 2 episodes  In May 2015 of possible TIAs . diabetic neuropathic pain responsive to gabapentin. PLAN:    I had a long discussion with the patient, wife and daughter regarding his stroke, risk factors, risk factor modification as well as diabetic neuropathy and answered questions. I advised him fall precautions. Continue gabapentin for neuropathic pain.Continue aspirin 81 mg orally every day and clopidogrel 75 mg orally every day  for secondary stroke prevention for atrial fibrillation and maintain strict control of hypertension with blood pressure goal below 130/90, diabetes with hemoglobin A1c goal below 6.5% and lipids with LDL cholesterol goal below 100 mg/dL. Followup in the future with me in  6 months   Delia Heady, MD  07/02/2014, 11:05 AM [facility] [address] [place], [place] [address] [phone]  Note: This document was prepared with digital dictation and possible smart phrase technology. Any transcriptional errors that result from this process are unintentional.

## 2014-07-17 ENCOUNTER — Encounter: Payer: Self-pay | Admitting: Internal Medicine

## 2014-07-17 ENCOUNTER — Ambulatory Visit (INDEPENDENT_AMBULATORY_CARE_PROVIDER_SITE_OTHER): Payer: Medicare Other | Admitting: Internal Medicine

## 2014-07-17 VITALS — BP 134/58 | HR 52 | Ht 65.0 in | Wt 203.0 lb

## 2014-07-17 DIAGNOSIS — I4729 Other ventricular tachycardia: Secondary | ICD-10-CM

## 2014-07-17 DIAGNOSIS — I5022 Chronic systolic (congestive) heart failure: Secondary | ICD-10-CM

## 2014-07-17 DIAGNOSIS — I6350 Cerebral infarction due to unspecified occlusion or stenosis of unspecified cerebral artery: Secondary | ICD-10-CM

## 2014-07-17 DIAGNOSIS — I4891 Unspecified atrial fibrillation: Secondary | ICD-10-CM

## 2014-07-17 DIAGNOSIS — Z95810 Presence of automatic (implantable) cardiac defibrillator: Secondary | ICD-10-CM

## 2014-07-17 DIAGNOSIS — I472 Ventricular tachycardia: Secondary | ICD-10-CM

## 2014-07-17 DIAGNOSIS — I480 Paroxysmal atrial fibrillation: Secondary | ICD-10-CM

## 2014-07-17 LAB — MDC_IDC_ENUM_SESS_TYPE_INCLINIC
Brady Statistic AP VP Percent: 1.04 %
Brady Statistic AS VS Percent: 13.34 %
HIGH POWER IMPEDANCE MEASURED VALUE: 44 Ohm
HIGH POWER IMPEDANCE MEASURED VALUE: 54 Ohm
Lead Channel Impedance Value: 437 Ohm
Lead Channel Impedance Value: 475 Ohm
Lead Channel Pacing Threshold Amplitude: 0.75 V
Lead Channel Pacing Threshold Pulse Width: 0.4 ms
Lead Channel Sensing Intrinsic Amplitude: 2.75 mV
Lead Channel Setting Pacing Amplitude: 2 V
Lead Channel Setting Pacing Amplitude: 2.5 V
Lead Channel Setting Pacing Pulse Width: 0.4 ms
MDC IDC MSMT BATTERY VOLTAGE: 3.02 V
MDC IDC MSMT LEADCHNL RA PACING THRESHOLD AMPLITUDE: 0.75 V
MDC IDC MSMT LEADCHNL RA PACING THRESHOLD PULSEWIDTH: 0.4 ms
MDC IDC MSMT LEADCHNL RA SENSING INTR AMPL: 2.375 mV
MDC IDC MSMT LEADCHNL RV SENSING INTR AMPL: 4.25 mV
MDC IDC MSMT LEADCHNL RV SENSING INTR AMPL: 4.875 mV
MDC IDC SESS DTM: 20150821092705
MDC IDC SET LEADCHNL RV SENSING SENSITIVITY: 0.3 mV
MDC IDC SET ZONE DETECTION INTERVAL: 450 ms
MDC IDC STAT BRADY AP VS PERCENT: 83.4 %
MDC IDC STAT BRADY AS VP PERCENT: 2.21 %
MDC IDC STAT BRADY RA PERCENT PACED: 84.45 %
MDC IDC STAT BRADY RV PERCENT PACED: 3.26 %
Zone Setting Detection Interval: 270 ms
Zone Setting Detection Interval: 340 ms
Zone Setting Detection Interval: 350 ms

## 2014-07-17 LAB — PACEMAKER DEVICE OBSERVATION

## 2014-07-17 MED ORDER — NITROGLYCERIN 0.4 MG SL SUBL: 0.4000 mg | SUBLINGUAL_TABLET | SUBLINGUAL | Status: AC | PRN

## 2014-07-17 NOTE — Patient Instructions (Signed)
Remote monitoring is used to monitor your Pacemaker of ICD from home. This monitoring reduces the number of office visits required to check your device to one time per year. It allows us to keep an eye on the functioning of your device to ensure it is working properly. You are scheduled for a device check from home on November 23rd.. You may send your transmission at any time that day. If you have a wireless device, the transmission will be sent automatically. After your physician reviews your transmission, you will receive a postcard with your next transmission date.  Your physician wants you to follow-up in: 1 year with Dr. Ladona Ridgelaylor. You will receive a reminder letter in the mail two months in advance. If you don't receive a letter, please call our office to schedule the follow-up appointment.  Your physician recommends that you continue on your current medications as directed. Please refer to the Current Medication list given to you today.  I have refilled your Nitrostat  Thank you for choosing [facility]!!

## 2014-07-17 NOTE — Assessment & Plan Note (Signed)
He is in atrial fib about 3.5% of the time. I would prefer him to be on Eliquis but I am concerned about the bleeding risk. I will defer the decision to Dr. Pearlean BrownieSethi. I do not know if ASA/Plavix risk of bleeding is same as risk of Eliquis alone.

## 2014-07-17 NOTE — Assessment & Plan Note (Signed)
His Medtronic ICD is working normally. Will recheck in several months. 

## 2014-07-17 NOTE — Progress Notes (Signed)
HPI [name] returns today for followup. He is a pleasant 80 yo man with an ICM, chronic systolic CHF, HTN, PAF, and a h/o tobacco abuse. He has had strokes. He had been on coumadin and had a large bleed requiring 7 units of blood according to wife. He has been left on plavix and ASA.  Allergies  Allergen Reactions  . Lorazepam   . Morphine Nausea Only  . Morphine And Related      Current Outpatient Prescriptions  Medication Sig Dispense Refill  . amLODipine (NORVASC) 5 MG tablet Take 5 mg by mouth daily.      Marland Kitchen aspirin 81 MG tablet Take 81 mg by mouth daily.      . carvedilol (COREG) 12.5 MG tablet Take 1 tablet (12.5 mg total) by mouth 2 (two) times daily.  180 tablet  3  . cholecalciferol (VITAMIN D) 1000 UNITS tablet Take 1,000 Units by mouth daily.      . clopidogrel (PLAVIX) 75 MG tablet Take 75 tablets by mouth daily.      Marland Kitchen doxazosin (CARDURA) 4 MG tablet Take 4 mg by mouth at bedtime.        . furosemide (LASIX) 40 MG tablet Take 80 mg by mouth 2 (two) times daily with breakfast and lunch.      . gabapentin (NEURONTIN) 300 MG capsule Take 1 capsule (300 mg total) by mouth 2 (two) times daily.  60 capsule  3  . insulin aspart (NOVOLOG) 100 UNIT/ML injection Inject 0-9 Units into the skin 3 (three) times daily with meals.  1 vial    . insulin glargine (LANTUS) 100 UNIT/ML injection Inject 0.3 mLs (30 Units total) into the skin at bedtime.  10 mL    . levothyroxine (SYNTHROID, LEVOTHROID) 50 MCG tablet Take 50 tablets by mouth daily before breakfast.      . nitroGLYCERIN (NITROSTAT) 0.4 MG SL tablet Place 0.4 mg under the tongue every 5 (five) minutes as needed.        Marland Kitchen omeprazole (PRILOSEC) 20 MG capsule Take 20 mg by mouth daily.       . simvastatin (ZOCOR) 40 MG tablet Take 20 mg by mouth at bedtime.       . [DISCONTINUED] benazepril (LOTENSIN) 20 MG tablet Take 20 mg by mouth daily.        . [DISCONTINUED] metoprolol (LOPRESSOR) 100 MG tablet Take 100 mg by mouth 2 (two)  times daily.        . [DISCONTINUED] telmisartan (MICARDIS) 80 MG tablet Take 80 mg by mouth daily.         No current facility-administered medications for this visit.     Past Medical History  Diagnosis Date  . RBBB (right bundle branch block)   . AAA (abdominal aortic aneurysm)   . AMI (acute myocardial infarction)   . Diabetes mellitus   . Coronary artery disease   . Pacemaker   . Defibrillator activation   . Hypertension   . Heart disease   . Heart attack   . Wears dentures   . Hyperlipidemia   . Shortness of breath   . Kidney disease   . Hyperpotassemia   . Edema     BLE  . Systolic CHF   . Stroke     ROS:   All systems reviewed and negative except as noted in the HPI.   Past Surgical History  Procedure Laterality Date  . Coronary artery bypass graft  1990  . Icd  2011  .  Hernia repair      umbilical and RIH  . Aneurysm coiling    . Rotator cuff repair      bilateral  . Lasik      with lens implant.      Family History  Problem Relation Age of Onset  . Heart disease Mother   . Diabetes Mother   . Cancer Brother     colon  . Heart disease Brother     heart attack  . Stroke Other   . Hypertension Other      History   Social History  . Marital Status: Married    Spouse Name: [place]    Number of Children: 3  . Years of Education: 4th   Occupational History  .     Social History Main Topics  . Smoking status: Former Smoker -- 30 years    Types: Cigarettes    Quit date: 11/28/1987  . Smokeless tobacco: Never Used  . Alcohol Use: No  . Drug Use: No  . Sexual Activity: Not on file   Other Topics Concern  . Not on file   Social History Narrative   Patient lives at home with spouse.   Caffeine Use:  2 cups daily     BP 134/58  Pulse 52  Ht 5\' 5"  (1.651 m)  Wt 203 lb (92.08 kg)  BMI 33.78 kg/m2  Physical Exam:  Well appearing elderly man, NAD HEENT: Unremarkable Neck:  7 cm JVD, no thyromegally Back:  No CVA  tenderness Lungs:  Clear with no wheezes rales, or rhonchi HEART:  Regular rate rhythm, no murmurs, no rubs, no clicks Abd:  soft, positive bowel sounds, no organomegally, no rebound, no guarding Ext:  2 plus pulses, no edema, no cyanosis, no clubbing Skin:  No rashes no nodules Neuro:  CN II through XII intact, motor grossly intact, appears a bit slower with his speech   DEVICE  Normal device function.  See PaceArt for details.   Assess/Plan:

## 2014-07-17 NOTE — Assessment & Plan Note (Signed)
His symptoms are class 2A. He admits to some sodium indiscretion and mild peripheral edema. Will follow.

## 2014-07-21 ENCOUNTER — Ambulatory Visit: Payer: Medicare Other | Admitting: Neurology

## 2014-08-04 ENCOUNTER — Telehealth: Payer: Self-pay

## 2014-08-04 ENCOUNTER — Other Ambulatory Visit: Payer: Self-pay

## 2014-08-04 DIAGNOSIS — Z5181 Encounter for therapeutic drug level monitoring: Secondary | ICD-10-CM

## 2014-08-04 MED ORDER — APIXABAN 5 MG PO TABS: 5.0000 mg | ORAL_TABLET | Freq: Two times a day (BID) | ORAL | Status: DC

## 2014-08-04 NOTE — Telephone Encounter (Signed)
Spoke with wife as pt is unable to comprehend per wife. The patient/wife have  agreed to take Eliquis 5 mg BID.I e-sribed order to [employer]. Wife will stop by tomorrow for samples and to fill out pt assistance form.She is aware of risk of bleeding being 1 in a hundred and we will be stopping ASA.lab orders placed for CBC and BMP for 4 weeks.Apt scheduled to see Dr.Taylor on Oct 2 nd at 11:30 am   The following note sent from Dennis Bast RN on 07/31/14 from Dr.Taylor: "let the patient know that I have spoken to Dr. Pearlean Brownie. We both think that stopping ASA and starting Elliquis 5 mg twice daily is the correct medical decision. His Eliquis caries a one chance in a hundred risk of bleeding. Please communicate this to patient and I would like him to come back for a CBC and a BMP in 4 weeks and to see me back in 5-6 weeks." GT ----- Message ----- From: Delia Heady, MD Sent: 07/20/2014 3:08 PM To: Marinus Maw, MD Yes as long as we document discussion with him about risk benefit and he is willing Pramod ----- Message ----- From: Marinus Maw, MD Sent: 07/17/2014 9:53 AM To: Delia Heady, MD Pramod, I wanted to make you aware (you may already be) that Mr. Campusano is out of rhythm and in atrial fib about 3.5% of the time based on ICD interogation. He has episodes of atrial fib lasting up to 48 hours. I know he has already had strokes and his CHADSVASC score is 6. I also no that he has a h/o GI bleeding on warfarin. Do you think Eliquis a consideration. Lewayne Bunting  rro

## 2014-08-14 ENCOUNTER — Telehealth: Payer: Self-pay

## 2014-08-14 NOTE — Telephone Encounter (Signed)
Faxed to [employer] pt assistance form for eliquis.

## 2014-08-18 ENCOUNTER — Telehealth: Payer: Self-pay

## 2014-08-18 NOTE — Telephone Encounter (Signed)
Message copied by Nori Riis on Tue Aug 18, 2014  4:31 PM ------      Message from: Marinus Maw      Created: Mon Aug 17, 2014  8:20 AM      Regarding: RE: Just started eliquis today       Tough call. Stop Plavix as I am concerned about the bleeding risk being on both.      ----- Message -----         From: Nori Riis, RN         Sent: 08/05/2014   4:06 PM           To: Marinus Maw, MD      Subject: Just started eliquis today                               Do you want pt to remain on Plavix? He stopped the ASA       ------

## 2014-08-18 NOTE — Telephone Encounter (Signed)
Spoke with wife,pt will stop Plavix

## 2014-08-28 ENCOUNTER — Encounter: Payer: Self-pay | Admitting: Internal Medicine

## 2014-08-28 ENCOUNTER — Ambulatory Visit (INDEPENDENT_AMBULATORY_CARE_PROVIDER_SITE_OTHER): Payer: Medicare Other | Admitting: Internal Medicine

## 2014-08-28 VITALS — BP 99/59 | HR 75 | Ht 64.0 in | Wt 197.8 lb

## 2014-08-28 DIAGNOSIS — I480 Paroxysmal atrial fibrillation: Secondary | ICD-10-CM

## 2014-08-28 DIAGNOSIS — I639 Cerebral infarction, unspecified: Secondary | ICD-10-CM

## 2014-08-28 DIAGNOSIS — I5022 Chronic systolic (congestive) heart failure: Secondary | ICD-10-CM

## 2014-08-28 DIAGNOSIS — Z95810 Presence of automatic (implantable) cardiac defibrillator: Secondary | ICD-10-CM

## 2014-08-28 DIAGNOSIS — I472 Ventricular tachycardia: Secondary | ICD-10-CM

## 2014-08-28 DIAGNOSIS — I4729 Other ventricular tachycardia: Secondary | ICD-10-CM

## 2014-08-28 LAB — MDC_IDC_ENUM_SESS_TYPE_INCLINIC
Battery Voltage: 3.01 V
Brady Statistic AP VP Percent: 0.87 %
Brady Statistic AS VP Percent: 12.73 %
Brady Statistic AS VS Percent: 18.09 %
Brady Statistic RV Percent Paced: 13.59 %
HIGH POWER IMPEDANCE MEASURED VALUE: 51 Ohm
HIGH POWER IMPEDANCE MEASURED VALUE: 62 Ohm
Lead Channel Impedance Value: 494 Ohm
Lead Channel Pacing Threshold Amplitude: 0.625 V
Lead Channel Pacing Threshold Pulse Width: 0.4 ms
Lead Channel Sensing Intrinsic Amplitude: 1.875 mV
Lead Channel Sensing Intrinsic Amplitude: 5.75 mV
Lead Channel Setting Pacing Amplitude: 2 V
Lead Channel Setting Pacing Pulse Width: 0.4 ms
Lead Channel Setting Sensing Sensitivity: 0.3 mV
MDC IDC MSMT LEADCHNL RA IMPEDANCE VALUE: 494 Ohm
MDC IDC MSMT LEADCHNL RA PACING THRESHOLD AMPLITUDE: 0.75 V
MDC IDC MSMT LEADCHNL RA SENSING INTR AMPL: 1 mV
MDC IDC MSMT LEADCHNL RV PACING THRESHOLD PULSEWIDTH: 0.4 ms
MDC IDC MSMT LEADCHNL RV SENSING INTR AMPL: 7 mV
MDC IDC SESS DTM: 20151002112733
MDC IDC SET LEADCHNL RV PACING AMPLITUDE: 2.5 V
MDC IDC SET ZONE DETECTION INTERVAL: 350 ms
MDC IDC SET ZONE DETECTION INTERVAL: 450 ms
MDC IDC STAT BRADY AP VS PERCENT: 68.32 %
MDC IDC STAT BRADY RA PERCENT PACED: 69.18 %
Zone Setting Detection Interval: 270 ms
Zone Setting Detection Interval: 340 ms

## 2014-08-28 LAB — PACEMAKER DEVICE OBSERVATION

## 2014-08-28 NOTE — Assessment & Plan Note (Signed)
His symptoms are class 2. He will continue his current meds except his blood pressure is low today and I have asked him to stop his amlodipine.

## 2014-08-28 NOTE — Patient Instructions (Addendum)
Remote monitoring is used to monitor your Pacemaker of ICD from home. This monitoring reduces the number of office visits required to check your device to one time per year. It allows us to keep an eye on the functioning of your device to ensure it is working properly. You are scheduled for a device check from home on January 4 th. You may send your transmission at any time that day. If you have a wireless device, the transmission will be sent automatically. After your physician reviews your transmission, you will receive a postcard with your next transmission date.   Your physician wants you to follow-up in: 1 year with Dr. Court Joyaylor You will receive a reminder letter in the mail two months in advance. If you don't receive a letter, please call our office to schedule the follow-up appointment.  Your physician has recommended you make the following change in your medication:   STOP TAKING AMLODIPINE   STOP TAKING SIMVASTATIN  Thank you for choosing [facility]!!

## 2014-08-28 NOTE — Assessment & Plan Note (Signed)
His medtronic device is working normally. Will recheck in several months.  

## 2014-08-28 NOTE — Progress Notes (Signed)
HPI [name] returns today for followup. He is a pleasant 80 yo man with an ICM, chronic systolic CHF, HTN, PAF, and a h/o tobacco abuse. He has had strokes. He had been on coumadin and had a large bleed requiring 7 units of blood according to wife. He has been left on plavix and ASA. He has had episodes of altered mentation of unclear etiology. He was initially thought to be hypoglycemic, but in retrospect probably not. He has had some weakness. His blood pressure has been on the low side. Allergies  Allergen Reactions  . Lorazepam Other (See Comments)    "MAKES CRAZY"  . Morphine Nausea Only  . Morphine And Related Other (See Comments)    Makes crazy     Current Outpatient Prescriptions  Medication Sig Dispense Refill  . apixaban (ELIQUIS) 5 MG TABS tablet Take 1 tablet (5 mg total) by mouth 2 (two) times daily.  60 tablet  3  . carvedilol (COREG) 12.5 MG tablet Take 1 tablet (12.5 mg total) by mouth 2 (two) times daily.  180 tablet  3  . cholecalciferol (VITAMIN D) 1000 UNITS tablet Take 1,000 Units by mouth daily.      Marland Kitchen doxazosin (CARDURA) 4 MG tablet Take 4 mg by mouth at bedtime.        . furosemide (LASIX) 40 MG tablet Take 80 mg by mouth 2 (two) times daily with breakfast and lunch.      . gabapentin (NEURONTIN) 300 MG capsule Take 1 capsule (300 mg total) by mouth 2 (two) times daily.  60 capsule  3  . insulin aspart (NOVOLOG) 100 UNIT/ML injection Inject 0-9 Units into the skin 3 (three) times daily with meals.  1 vial    . insulin glargine (LANTUS) 100 UNIT/ML injection Inject 0.3 mLs (30 Units total) into the skin at bedtime.  10 mL    . levothyroxine (SYNTHROID, LEVOTHROID) 50 MCG tablet Take 50 mcg by mouth daily before breakfast.       . nitroGLYCERIN (NITROSTAT) 0.4 MG SL tablet Place 1 tablet (0.4 mg total) under the tongue every 5 (five) minutes as needed.  25 tablet  3  . omeprazole (PRILOSEC) 20 MG capsule Take 20 mg by mouth daily.       . potassium chloride  (KLOR-CON) 8 MEQ tablet Take 8 mEq by mouth 2 (two) times daily.      . [DISCONTINUED] benazepril (LOTENSIN) 20 MG tablet Take 20 mg by mouth daily.        . [DISCONTINUED] metoprolol (LOPRESSOR) 100 MG tablet Take 100 mg by mouth 2 (two) times daily.        . [DISCONTINUED] telmisartan (MICARDIS) 80 MG tablet Take 80 mg by mouth daily.         No current facility-administered medications for this visit.     Past Medical History  Diagnosis Date  . RBBB (right bundle branch block)   . AAA (abdominal aortic aneurysm)   . AMI (acute myocardial infarction)   . Diabetes mellitus   . Coronary artery disease   . Pacemaker   . Defibrillator activation   . Hypertension   . Heart disease   . Heart attack   . Wears dentures   . Hyperlipidemia   . Shortness of breath   . Kidney disease   . Hyperpotassemia   . Edema     BLE  . Systolic CHF   . Stroke     ROS:   All systems reviewed and  negative except as noted in the HPI.   Past Surgical History  Procedure Laterality Date  . Coronary artery bypass graft  1990  . Icd  2011  . Hernia repair      umbilical and RIH  . Aneurysm coiling    . Rotator cuff repair      bilateral  . Lasik      with lens implant.      Family History  Problem Relation Age of Onset  . Heart disease Mother   . Diabetes Mother   . Cancer Brother     colon  . Heart disease Brother     heart attack  . Stroke Other   . Hypertension Other      History   Social History  . Marital Status: Married    Spouse Name: [place]    Number of Children: 3  . Years of Education: 4th   Occupational History  .     Social History Main Topics  . Smoking status: Former Smoker -- 30 years    Types: Cigarettes    Quit date: 11/28/1987  . Smokeless tobacco: Never Used  . Alcohol Use: No  . Drug Use: No  . Sexual Activity: Not on file   Other Topics Concern  . Not on file   Social History Narrative   Patient lives at home with spouse.   Caffeine  Use:  2 cups daily     BP 99/59  Pulse 75  Ht 5\' 4"  (1.626 m)  Wt 197 lb 12.8 oz (89.721 kg)  BMI 33.94 kg/m2  Physical Exam:  Well appearing elderly man, NAD HEENT: Unremarkable Neck:  7 cm JVD, no thyromegally Back:  No CVA tenderness Lungs:  Clear with no wheezes rales, or rhonchi HEART:  Regular rate rhythm, no murmurs, no rubs, no clicks Abd:  soft, positive bowel sounds, no organomegally, no rebound, no guarding Ext:  2 plus pulses, no edema, no cyanosis, no clubbing Skin:  No rashes no nodules Neuro:  CN II through XII intact, motor grossly intact, appears a bit slower with his speech   DEVICE  Normal device function.  See PaceArt for details.   Assess/Plan:

## 2014-08-28 NOTE — Assessment & Plan Note (Signed)
His ventricular rate is well controlled. He has had more atrial fib. He is tolerating Eliquis with no bleeding.

## 2014-09-02 LAB — CBC WITH DIFFERENTIAL/PLATELET
BASOS ABS: 0 10*3/uL (ref 0.0–0.1)
BASOS PCT: 0 % (ref 0–1)
EOS ABS: 0.3 10*3/uL (ref 0.0–0.7)
Eosinophils Relative: 2 % (ref 0–5)
HCT: 41.8 % (ref 39.0–52.0)
HEMOGLOBIN: 14.8 g/dL (ref 13.0–17.0)
LYMPHS ABS: 1.5 10*3/uL (ref 0.7–4.0)
LYMPHS PCT: 12 % (ref 12–46)
MCH: 30.3 pg (ref 26.0–34.0)
MCHC: 35.4 g/dL (ref 30.0–36.0)
MCV: 85.5 fL (ref 78.0–100.0)
MONOS PCT: 12 % (ref 3–12)
Monocytes Absolute: 1.5 10*3/uL — ABNORMAL HIGH (ref 0.1–1.0)
NEUTROS PCT: 74 % (ref 43–77)
Neutro Abs: 9.3 10*3/uL — ABNORMAL HIGH (ref 1.7–7.7)
PLATELETS: 200 10*3/uL (ref 150–400)
RBC: 4.89 MIL/uL (ref 4.22–5.81)
RDW: 16.3 % — ABNORMAL HIGH (ref 11.5–15.5)
WBC: 12.6 10*3/uL — AB (ref 4.0–10.5)

## 2014-09-02 LAB — BASIC METABOLIC PANEL
BUN: 92 mg/dL — AB (ref 6–23)
CO2: 40 mEq/L — ABNORMAL HIGH (ref 19–32)
CREATININE: 3.17 mg/dL — AB (ref 0.50–1.35)
Calcium: 10 mg/dL (ref 8.4–10.5)
Chloride: 75 mEq/L — ABNORMAL LOW (ref 96–112)
Glucose, Bld: 173 mg/dL — ABNORMAL HIGH (ref 70–99)
Potassium: 3.3 mEq/L — ABNORMAL LOW (ref 3.5–5.3)
Sodium: 134 mEq/L — ABNORMAL LOW (ref 135–145)

## 2014-09-03 ENCOUNTER — Telehealth: Payer: Self-pay

## 2014-09-03 MED ORDER — FUROSEMIDE 40 MG PO TABS: ORAL_TABLET | ORAL | Status: AC

## 2014-09-03 NOTE — Telephone Encounter (Signed)
Received lab call from [facility] with criticallabd value CO2 40   Will forward to [place] for Dr.Taylor

## 2014-09-03 NOTE — Telephone Encounter (Signed)
Per Dr.Taylor,decrease lasix from 80 mg BID to 80 mg am and 40 mg pm,Wife verbalized understanding

## 2014-09-18 ENCOUNTER — Telehealth: Payer: Self-pay | Admitting: *Deleted

## 2014-09-18 NOTE — Telephone Encounter (Signed)
PRE AUTH FOR ELIQUIS 5 MG #60. [employer]

## 2014-09-19 NOTE — Telephone Encounter (Signed)
PA approved for 1 yr PA # [id number]

## 2014-09-21 ENCOUNTER — Telehealth: Payer: Self-pay | Admitting: *Deleted

## 2014-09-21 NOTE — Telephone Encounter (Signed)
Called [employer] and assistance will be processed by 09/24/14. Jason Walker will notify pt either way. Will f/u on 10/29

## 2014-09-21 NOTE — Telephone Encounter (Signed)
Pt dropped off eliquis papers for assistance, patients insurance covered medication but they still have to pay $150 a month. If they do not get approved for help they will need to be switched to something else/tmj

## 2014-09-24 ENCOUNTER — Telehealth: Payer: Self-pay | Admitting: *Deleted

## 2014-09-24 NOTE — Telephone Encounter (Signed)
Spoke with [place] at [employer]. Pt was denied for assistance on Eliquis since he has Medicare part D. Pt had stated previously that if he couldn't get assistance then he would need to be switched to another medication because he could not afford eliquis. Will forward Dr Ladona Ridgelaylor to switch medication

## 2014-09-28 ENCOUNTER — Telehealth: Payer: Self-pay | Admitting: Internal Medicine

## 2014-09-28 NOTE — Telephone Encounter (Signed)
Pt wife notified that pt was denied for assistance for Eliquis. Pt wants to go on coumadin so they can afford medication. Left samples for pt in front office until OK from Dr. Ladona Ridgelaylor to start coumadin. Forwarded message to Dr. Ladona Ridgelaylor Thursday, also gave pt [address] office number.

## 2014-09-28 NOTE — Telephone Encounter (Signed)
Spoke with wife. Appt made for 10/21/14 to transition pt from Eliquis to Coumadin due to cost.

## 2014-09-28 NOTE — Telephone Encounter (Signed)
Follow up      Pt cannot afford eliquis.  Pt want to switch to coumadin.  Please call

## 2014-09-28 NOTE — Telephone Encounter (Signed)
Dr Ladona Ridgelaylor reviewed and said okay to stop Eliquis and start Coumadin.  Will send to Jason HeyLisa Reid, RN in the [facility] clinic in [place] to handle

## 2014-09-28 NOTE — Telephone Encounter (Signed)
Would like to discuss medication and not being able to afford it / tgs

## 2014-10-13 NOTE — Telephone Encounter (Signed)
Ok to switch to coumadin. GT

## 2014-10-14 ENCOUNTER — Telehealth: Payer: Self-pay | Admitting: *Deleted

## 2014-10-14 NOTE — Telephone Encounter (Signed)
Will forward to get established with coumadin clinic per Dr. Ladona Ridgelaylor and pt cannot afford medication

## 2014-10-15 ENCOUNTER — Other Ambulatory Visit: Payer: Self-pay | Admitting: Neurology

## 2014-10-26 ENCOUNTER — Ambulatory Visit (INDEPENDENT_AMBULATORY_CARE_PROVIDER_SITE_OTHER): Payer: Medicare Other | Admitting: *Deleted

## 2014-10-26 DIAGNOSIS — I4891 Unspecified atrial fibrillation: Secondary | ICD-10-CM

## 2014-10-26 LAB — POCT INR: INR: 1.5

## 2014-11-02 ENCOUNTER — Telehealth: Payer: Self-pay | Admitting: Internal Medicine

## 2014-11-02 MED ORDER — WARFARIN SODIUM 2.5 MG PO TABS: 2.5000 mg | ORAL_TABLET | Freq: Every day | ORAL | Status: DC

## 2014-11-02 NOTE — Telephone Encounter (Signed)
Patient needs Coumadin RX [facility] to [facility] [place] / tgs

## 2014-11-09 ENCOUNTER — Ambulatory Visit (INDEPENDENT_AMBULATORY_CARE_PROVIDER_SITE_OTHER): Payer: Medicare Other | Admitting: *Deleted

## 2014-11-09 DIAGNOSIS — I63239 Cerebral infarction due to unspecified occlusion or stenosis of unspecified carotid arteries: Secondary | ICD-10-CM

## 2014-11-09 DIAGNOSIS — I639 Cerebral infarction, unspecified: Secondary | ICD-10-CM

## 2014-11-09 DIAGNOSIS — I4891 Unspecified atrial fibrillation: Secondary | ICD-10-CM

## 2014-11-09 LAB — POCT INR: INR: 1.3

## 2014-11-17 ENCOUNTER — Ambulatory Visit (INDEPENDENT_AMBULATORY_CARE_PROVIDER_SITE_OTHER): Payer: Medicare Other | Admitting: *Deleted

## 2014-11-17 DIAGNOSIS — I639 Cerebral infarction, unspecified: Secondary | ICD-10-CM

## 2014-11-17 DIAGNOSIS — I4891 Unspecified atrial fibrillation: Secondary | ICD-10-CM

## 2014-11-17 LAB — POCT INR: INR: 2.6

## 2014-11-24 ENCOUNTER — Ambulatory Visit (INDEPENDENT_AMBULATORY_CARE_PROVIDER_SITE_OTHER): Payer: Medicare Other | Admitting: *Deleted

## 2014-11-24 DIAGNOSIS — I63239 Cerebral infarction due to unspecified occlusion or stenosis of unspecified carotid arteries: Secondary | ICD-10-CM

## 2014-11-24 DIAGNOSIS — I639 Cerebral infarction, unspecified: Secondary | ICD-10-CM

## 2014-11-24 DIAGNOSIS — I4891 Unspecified atrial fibrillation: Secondary | ICD-10-CM

## 2014-11-24 LAB — POCT INR: INR: 4.5

## 2014-11-30 ENCOUNTER — Telehealth: Payer: Self-pay | Admitting: Cardiology

## 2014-11-30 ENCOUNTER — Telehealth: Payer: Self-pay | Admitting: Internal Medicine

## 2014-11-30 ENCOUNTER — Ambulatory Visit (INDEPENDENT_AMBULATORY_CARE_PROVIDER_SITE_OTHER): Payer: Medicare Other | Admitting: *Deleted

## 2014-11-30 DIAGNOSIS — I5022 Chronic systolic (congestive) heart failure: Secondary | ICD-10-CM

## 2014-11-30 DIAGNOSIS — I472 Ventricular tachycardia: Secondary | ICD-10-CM

## 2014-11-30 DIAGNOSIS — I4729 Other ventricular tachycardia: Secondary | ICD-10-CM

## 2014-11-30 NOTE — Telephone Encounter (Signed)
New message      Pt cannot do remote check because he is in the hosp

## 2014-11-30 NOTE — Telephone Encounter (Signed)
Informed pt wife that when pt gets out of hospital to have pt send transmission at that time. She verbalized understanding.

## 2014-11-30 NOTE — Telephone Encounter (Signed)
Pt to send transmission when he gets out of hospital.

## 2014-12-01 ENCOUNTER — Encounter: Payer: Self-pay | Admitting: Cardiology

## 2014-12-02 DIAGNOSIS — I5022 Chronic systolic (congestive) heart failure: Secondary | ICD-10-CM

## 2014-12-02 DIAGNOSIS — I472 Ventricular tachycardia: Secondary | ICD-10-CM

## 2014-12-02 LAB — MDC_IDC_ENUM_SESS_TYPE_REMOTE
Brady Statistic AP VS Percent: 1.06 %
Brady Statistic AS VP Percent: 35.01 %
Brady Statistic AS VS Percent: 63.38 %
Brady Statistic RA Percent Paced: 1.61 %
HIGH POWER IMPEDANCE MEASURED VALUE: 48 Ohm
HighPow Impedance: 38 Ohm
Lead Channel Impedance Value: 437 Ohm
Lead Channel Impedance Value: 437 Ohm
Lead Channel Pacing Threshold Amplitude: 0.5 V
Lead Channel Pacing Threshold Pulse Width: 0.4 ms
Lead Channel Pacing Threshold Pulse Width: 0.4 ms
Lead Channel Sensing Intrinsic Amplitude: 4.625 mV
Lead Channel Setting Pacing Amplitude: 2 V
MDC IDC MSMT BATTERY VOLTAGE: 2.97 V
MDC IDC MSMT LEADCHNL RA PACING THRESHOLD AMPLITUDE: 0.75 V
MDC IDC MSMT LEADCHNL RA SENSING INTR AMPL: 0.875 mV
MDC IDC MSMT LEADCHNL RA SENSING INTR AMPL: 0.875 mV
MDC IDC MSMT LEADCHNL RV SENSING INTR AMPL: 4.625 mV
MDC IDC SESS DTM: 20160106225626
MDC IDC SET LEADCHNL RV PACING AMPLITUDE: 2.5 V
MDC IDC SET LEADCHNL RV PACING PULSEWIDTH: 0.4 ms
MDC IDC SET LEADCHNL RV SENSING SENSITIVITY: 0.3 mV
MDC IDC SET ZONE DETECTION INTERVAL: 270 ms
MDC IDC SET ZONE DETECTION INTERVAL: 340 ms
MDC IDC STAT BRADY AP VP PERCENT: 0.55 %
MDC IDC STAT BRADY RV PERCENT PACED: 35.56 %
Zone Setting Detection Interval: 350 ms
Zone Setting Detection Interval: 450 ms

## 2014-12-03 NOTE — Progress Notes (Signed)
Remote ICD transmission.   

## 2014-12-07 ENCOUNTER — Ambulatory Visit (INDEPENDENT_AMBULATORY_CARE_PROVIDER_SITE_OTHER): Payer: Medicare Other | Admitting: *Deleted

## 2014-12-07 ENCOUNTER — Telehealth: Payer: Self-pay | Admitting: *Deleted

## 2014-12-07 DIAGNOSIS — I4891 Unspecified atrial fibrillation: Secondary | ICD-10-CM

## 2014-12-07 DIAGNOSIS — I639 Cerebral infarction, unspecified: Secondary | ICD-10-CM

## 2014-12-07 LAB — POCT INR: INR: 2.6

## 2014-12-07 NOTE — Telephone Encounter (Signed)
Called pt due to persistant AF since 08-2014 and abn thoracic impedance. Per wife pt is SOB + LE edema. Will schedule pt to F/U w/ [facility]. [facility]

## 2014-12-08 ENCOUNTER — Encounter: Payer: Medicare Other | Admitting: Internal Medicine

## 2014-12-24 ENCOUNTER — Encounter: Payer: Self-pay | Admitting: Cardiology

## 2014-12-30 ENCOUNTER — Ambulatory Visit: Payer: Medicare Other | Admitting: Neurology

## 2014-12-30 ENCOUNTER — Encounter: Payer: Self-pay | Admitting: Internal Medicine

## 2014-12-30 ENCOUNTER — Ambulatory Visit (INDEPENDENT_AMBULATORY_CARE_PROVIDER_SITE_OTHER): Payer: Medicare Other | Admitting: Internal Medicine

## 2014-12-30 VITALS — BP 114/60 | HR 82 | Ht 65.0 in | Wt 189.4 lb

## 2014-12-30 DIAGNOSIS — I4891 Unspecified atrial fibrillation: Secondary | ICD-10-CM

## 2014-12-30 DIAGNOSIS — Z95810 Presence of automatic (implantable) cardiac defibrillator: Secondary | ICD-10-CM

## 2014-12-30 DIAGNOSIS — N184 Chronic kidney disease, stage 4 (severe): Secondary | ICD-10-CM

## 2014-12-30 DIAGNOSIS — I472 Ventricular tachycardia: Secondary | ICD-10-CM

## 2014-12-30 DIAGNOSIS — I639 Cerebral infarction, unspecified: Secondary | ICD-10-CM

## 2014-12-30 DIAGNOSIS — I4729 Other ventricular tachycardia: Secondary | ICD-10-CM

## 2014-12-30 DIAGNOSIS — I5022 Chronic systolic (congestive) heart failure: Secondary | ICD-10-CM

## 2014-12-30 LAB — MDC_IDC_ENUM_SESS_TYPE_INCLINIC
Battery Voltage: 2.97 V
Brady Statistic AP VP Percent: 0.47 %
Brady Statistic AP VS Percent: 0.84 %
Brady Statistic AS VP Percent: 29.87 %
Brady Statistic AS VS Percent: 68.82 %
HIGH POWER IMPEDANCE MEASURED VALUE: 54 Ohm
HighPow Impedance: 46 Ohm
Lead Channel Impedance Value: 475 Ohm
Lead Channel Pacing Threshold Amplitude: 0.625 V
Lead Channel Pacing Threshold Amplitude: 0.75 V
Lead Channel Pacing Threshold Pulse Width: 0.4 ms
Lead Channel Pacing Threshold Pulse Width: 0.4 ms
Lead Channel Sensing Intrinsic Amplitude: 1 mV
Lead Channel Sensing Intrinsic Amplitude: 2.125 mV
Lead Channel Sensing Intrinsic Amplitude: 4 mV
Lead Channel Setting Pacing Amplitude: 2 V
Lead Channel Setting Pacing Pulse Width: 0.4 ms
Lead Channel Setting Sensing Sensitivity: 0.3 mV
MDC IDC MSMT LEADCHNL RV IMPEDANCE VALUE: 475 Ohm
MDC IDC MSMT LEADCHNL RV SENSING INTR AMPL: 2.625 mV
MDC IDC SESS DTM: 20160203143726
MDC IDC SET LEADCHNL RV PACING AMPLITUDE: 2.5 V
MDC IDC SET ZONE DETECTION INTERVAL: 340 ms
MDC IDC SET ZONE DETECTION INTERVAL: 350 ms
MDC IDC STAT BRADY RA PERCENT PACED: 1.31 %
MDC IDC STAT BRADY RV PERCENT PACED: 30.34 %
Zone Setting Detection Interval: 270 ms
Zone Setting Detection Interval: 450 ms

## 2014-12-30 NOTE — Assessment & Plan Note (Signed)
His CHF is well compensated and he appears to be euvolemic. No change in medical therapy. He is strongly encouraged to maintain a low sodium diet.

## 2014-12-30 NOTE — Progress Notes (Signed)
HPI [name] returns today for followup. He is a pleasant 81 yo man with an ICM, chronic systolic CHF, HTN, PAF, and a h/o tobacco abuse. He has had strokes. He had been on coumadin and had a large bleed requiring 7 units of blood according to wife. He has been left on plavix and ASA. He has had episodes of altered mentation of unclear etiology. He was initially thought to be hypoglycemic, but in retrospect probably not. He has had some weakness. His blood pressure has been on the low side. The patient was hospitalized 3 times in the past 6 weeks with volume overload.  Allergies  Allergen Reactions  . Lorazepam Other (See Comments)    "MAKES CRAZY"  . Morphine Nausea Only  . Morphine And Related Other (See Comments)    Makes crazy     Current Outpatient Prescriptions  Medication Sig Dispense Refill  . carvedilol (COREG) 12.5 MG tablet Take 1 tablet (12.5 mg total) by mouth 2 (two) times daily. 180 tablet 3  . cholecalciferol (VITAMIN D) 1000 UNITS tablet Take 1,000 Units by mouth daily.    Marland Kitchen. doxazosin (CARDURA) 4 MG tablet Take 4 mg by mouth at bedtime.      . furosemide (LASIX) 40 MG tablet Take 80 mg in the am and 40 mg in the pm 90 tablet 3  . gabapentin (NEURONTIN) 300 MG capsule Take 300 mg by mouth 2 (two) times daily.    . insulin aspart (NOVOLOG) 100 UNIT/ML injection Inject 0-9 Units into the skin 3 (three) times daily with meals. 1 vial   . insulin glargine (LANTUS) 100 UNIT/ML injection Inject 0.3 mLs (30 Units total) into the skin at bedtime. 10 mL   . levothyroxine (SYNTHROID, LEVOTHROID) 50 MCG tablet Take 50 mcg by mouth daily before breakfast.     . metolazone (ZAROXOLYN) 5 MG tablet Take 5 mg by mouth daily.    . nitroGLYCERIN (NITROSTAT) 0.4 MG SL tablet Place 1 tablet (0.4 mg total) under the tongue every 5 (five) minutes as needed. (Patient taking differently: Place 0.4 mg under the tongue every 5 (five) minutes as needed for chest pain. ) 25 tablet 3  . omeprazole  (PRILOSEC) 20 MG capsule Take 20 mg by mouth daily.     . potassium chloride (KLOR-CON) 8 MEQ tablet Take 8 mEq by mouth 2 (two) times daily.    Marland Kitchen. warfarin (COUMADIN) 2.5 MG tablet Take 1 tablet (2.5 mg total) by mouth daily. Or as directed by coumadin clinic 90 tablet 3  . [DISCONTINUED] benazepril (LOTENSIN) 20 MG tablet Take 20 mg by mouth daily.      . [DISCONTINUED] metoprolol (LOPRESSOR) 100 MG tablet Take 100 mg by mouth 2 (two) times daily.      . [DISCONTINUED] telmisartan (MICARDIS) 80 MG tablet Take 80 mg by mouth daily.       No current facility-administered medications for this visit.     Past Medical History  Diagnosis Date  . RBBB (right bundle branch block)   . AAA (abdominal aortic aneurysm)   . AMI (acute myocardial infarction)   . Diabetes mellitus   . Coronary artery disease   . Pacemaker   . Defibrillator activation   . Hypertension   . Heart disease   . Heart attack   . Wears dentures   . Hyperlipidemia   . Shortness of breath   . Kidney disease   . Hyperpotassemia   . Edema     BLE  .  Systolic CHF   . Stroke     ROS:   All systems reviewed and negative except as noted in the HPI.   Past Surgical History  Procedure Laterality Date  . Coronary artery bypass graft  1990  . Icd  2011  . Hernia repair      umbilical and RIH  . Aneurysm coiling    . Rotator cuff repair      bilateral  . Lasik      with lens implant.      Family History  Problem Relation Age of Onset  . Heart disease Mother   . Diabetes Mother   . Cancer Brother     colon  . Heart disease Brother     heart attack  . Stroke Other   . Hypertension Other      History   Social History  . Marital Status: Married    Spouse Name: [place]    Number of Children: 3  . Years of Education: 4th   Occupational History  .     Social History Main Topics  . Smoking status: Former Smoker -- 30 years    Types: Cigarettes    Quit date: 11/28/1987  . Smokeless tobacco:  Never Used  . Alcohol Use: No  . Drug Use: No  . Sexual Activity: Not on file   Other Topics Concern  . Not on file   Social History Narrative   Patient lives at home with spouse.   Caffeine Use:  2 cups daily     BP 114/60 mmHg  Pulse 82  Ht  (1.651 m)  Wt 189 lb 6.4 oz (85.911 kg)  BMI 31.52 kg/m2  Physical Exam:  stable appearing elderly man, NAD HEENT: Unremarkable Neck:  7 cm JVD, no thyromegally Back:  No CVA tenderness Lungs:  Clear with no wheezes rales, or rhonchi HEART:  Regular rate rhythm, no murmurs, no rubs, no clicks Abd:  soft, positive bowel sounds, no organomegally, no rebound, no guarding Ext:  2 plus pulses, no edema, no cyanosis, no clubbing Skin:  No rashes no nodules Neuro:  CN II through XII intact, motor grossly intact, appears a bit slower with his speech   DEVICE  Normal device function.  See PaceArt for details.   Assess/Plan:

## 2014-12-30 NOTE — Assessment & Plan Note (Signed)
He is chronically in atrial fib and his rate is controlled. No change in medical therapy.

## 2014-12-30 NOTE — Assessment & Plan Note (Signed)
We discussed whether or not he would go on HD or not. He is considering his options. In general I think he would be a poor HD candidate.

## 2014-12-30 NOTE — Patient Instructions (Signed)
Remote monitoring is used to monitor your Pacemaker of ICD from home. This monitoring reduces the number of office visits required to check your device to one time per year. It allows us to keep an eye on the functioning of your device to ensure it is working properly. You are scheduled for a device check from home on 03/31/15. You may send your transmission at any time that day. If you have a wireless device, the transmission will be sent automatically. After your physician reviews your transmission, you will receive a postcard with your next transmission date.  Your physician wants you to follow-up in: 6 months with Dr. Taylor. You will receive a reminder letter in the mail two months in advance. If you don't receive a letter, please call our office to schedule the follow-up appointment.  Your physician recommends that you continue on your current medications as directed. Please refer to the Current Medication list given to you today.  

## 2014-12-30 NOTE — Assessment & Plan Note (Signed)
He has had no recurrent VT. He will continue his current meds.  

## 2014-12-30 NOTE — Assessment & Plan Note (Signed)
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 His fluid index has finally gone back down. Will follow.

## 2015-01-06 ENCOUNTER — Ambulatory Visit (INDEPENDENT_AMBULATORY_CARE_PROVIDER_SITE_OTHER): Payer: Medicare Other | Admitting: *Deleted

## 2015-01-06 DIAGNOSIS — I639 Cerebral infarction, unspecified: Secondary | ICD-10-CM

## 2015-01-06 DIAGNOSIS — I4891 Unspecified atrial fibrillation: Secondary | ICD-10-CM

## 2015-01-06 DIAGNOSIS — I480 Paroxysmal atrial fibrillation: Secondary | ICD-10-CM

## 2015-01-06 LAB — POCT INR: INR: 1.5

## 2015-01-18 ENCOUNTER — Ambulatory Visit (INDEPENDENT_AMBULATORY_CARE_PROVIDER_SITE_OTHER): Payer: Medicare Other | Admitting: *Deleted

## 2015-01-18 DIAGNOSIS — I63239 Cerebral infarction due to unspecified occlusion or stenosis of unspecified carotid arteries: Secondary | ICD-10-CM

## 2015-01-18 DIAGNOSIS — I4891 Unspecified atrial fibrillation: Secondary | ICD-10-CM

## 2015-01-18 DIAGNOSIS — I639 Cerebral infarction, unspecified: Secondary | ICD-10-CM

## 2015-01-18 DIAGNOSIS — I480 Paroxysmal atrial fibrillation: Secondary | ICD-10-CM

## 2015-01-18 LAB — POCT INR: INR: 1.6

## 2015-02-04 ENCOUNTER — Ambulatory Visit (INDEPENDENT_AMBULATORY_CARE_PROVIDER_SITE_OTHER): Payer: Medicare Other | Admitting: *Deleted

## 2015-02-04 DIAGNOSIS — I639 Cerebral infarction, unspecified: Secondary | ICD-10-CM

## 2015-02-04 DIAGNOSIS — I4891 Unspecified atrial fibrillation: Secondary | ICD-10-CM | POA: Diagnosis not present

## 2015-02-04 DIAGNOSIS — I480 Paroxysmal atrial fibrillation: Secondary | ICD-10-CM

## 2015-02-04 LAB — POCT INR: INR: 1.5

## 2015-02-11 ENCOUNTER — Ambulatory Visit (INDEPENDENT_AMBULATORY_CARE_PROVIDER_SITE_OTHER): Payer: Medicare Other | Admitting: Neurology

## 2015-02-11 ENCOUNTER — Encounter: Payer: Self-pay | Admitting: Neurology

## 2015-02-11 ENCOUNTER — Ambulatory Visit (INDEPENDENT_AMBULATORY_CARE_PROVIDER_SITE_OTHER): Payer: Medicare Other

## 2015-02-11 ENCOUNTER — Other Ambulatory Visit: Payer: Self-pay | Admitting: Neurology

## 2015-02-11 VITALS — BP 130/74 | HR 68 | Ht 65.0 in | Wt 195.4 lb

## 2015-02-11 DIAGNOSIS — R413 Other amnesia: Secondary | ICD-10-CM

## 2015-02-11 DIAGNOSIS — R0989 Other specified symptoms and signs involving the circulatory and respiratory systems: Secondary | ICD-10-CM | POA: Diagnosis not present

## 2015-02-11 DIAGNOSIS — R296 Repeated falls: Secondary | ICD-10-CM

## 2015-02-11 DIAGNOSIS — I639 Cerebral infarction, unspecified: Secondary | ICD-10-CM | POA: Diagnosis not present

## 2015-02-11 DIAGNOSIS — I69393 Ataxia following cerebral infarction: Secondary | ICD-10-CM

## 2015-02-11 IMAGING — CT CT ANGIO HEAD
2 of 3 series · 13 of 33 positions shown · IV contrast (omnipaque)
Comparison: None.

***ADDENDUM*** CREATED: 02/10/2013 [DATE]

Critical Value/emergent results were called by telephone at the
time of interpretation on 02/10/2013 at [DATE] p.m. to Dr. Lizeth
., who verbally acknowledged these results. ***END ADDENDUM***
CLINICAL DATA: Vomiting.  Unresponsive.  Evaluate posterior
circulation.
CT ANGIOGRAPHY HEAD
TECHNIQUE: Multidetector CT imaging of the head was performed
using the standard protocol during bolus administration of
intravenous contrast.  Multiplanar CT image reconstructions
including MIPs were obtained to evaluate the vascular anatomy.
Contrast: 50mL OMNIPAQUE IOHEXOL 350 MG/ML SOLN

[mpr, sag 20mm mip, sagittal · sagittal · 0.48mm/px · 1 of 33 slices shown]
[im 17/33  soft-tissue]
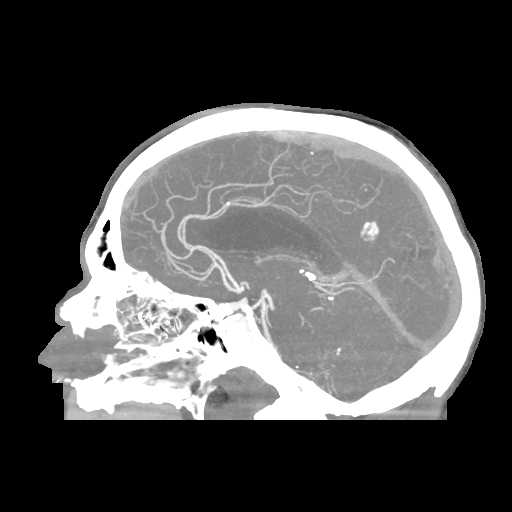

[mpr, ax. 20mm mip, axial · axial · 0.48mm/px · z∈[+239,+349]mm · 12 of 28 slices shown]
[im 3/28  soft-tissue]
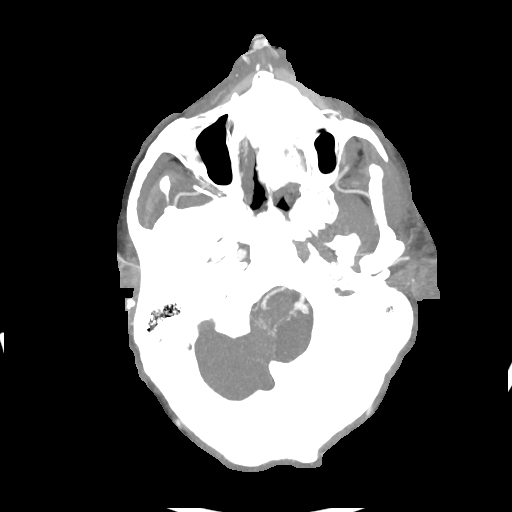
[im 5/28  bone]
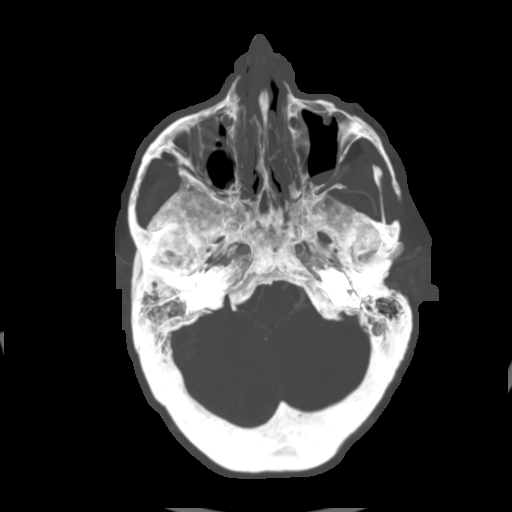
[im 7/28  soft-tissue]
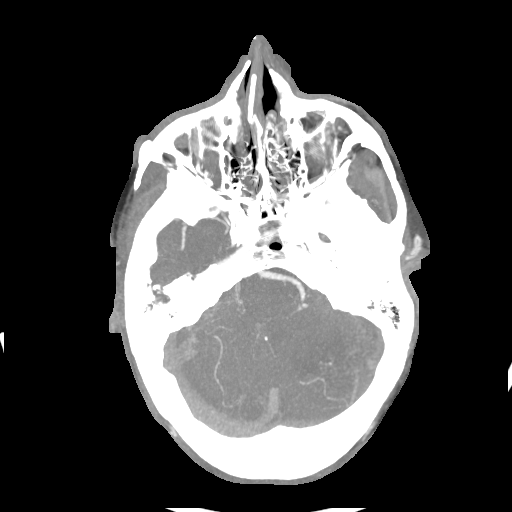
[im 9/28  bone]
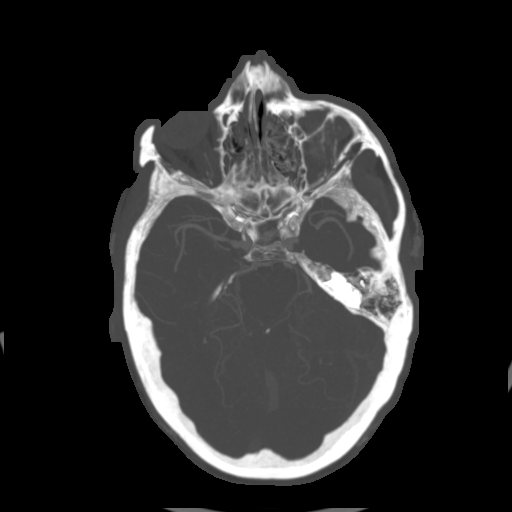
[im 11/28  soft-tissue]
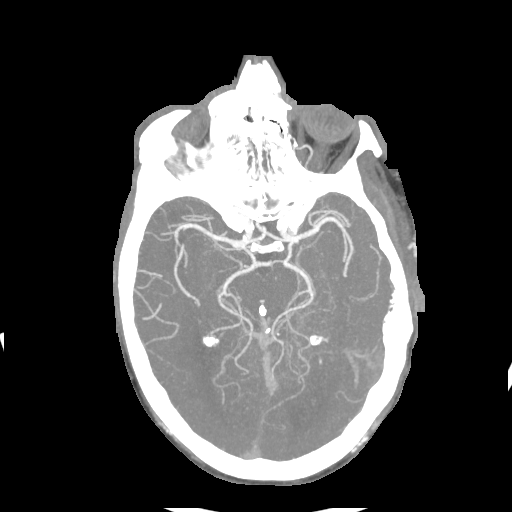
[im 13/28  bone]
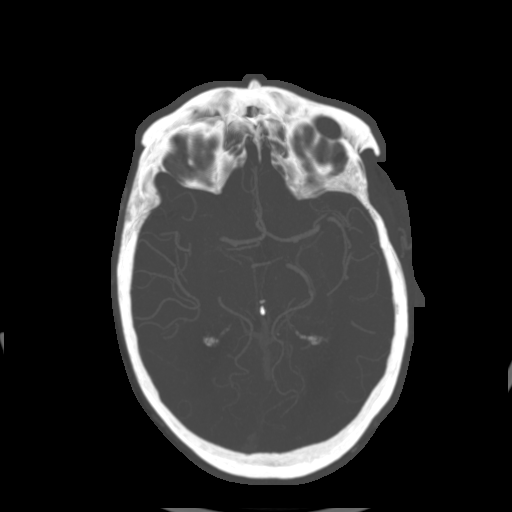
[im 15/28  soft-tissue]
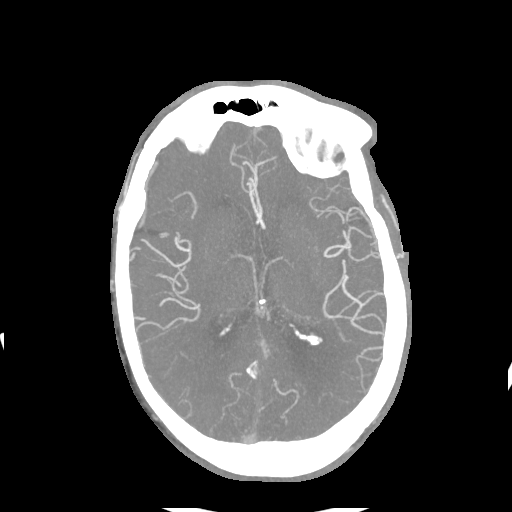
[im 17/28  bone]
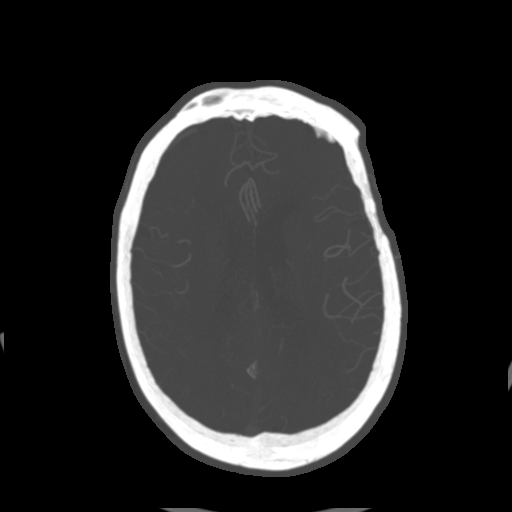
[im 19/28  soft-tissue]
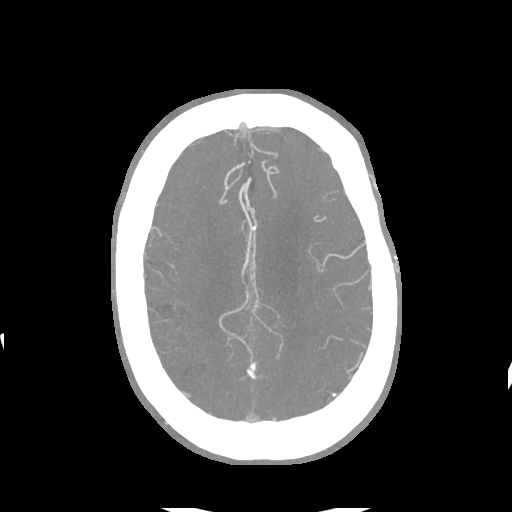
[im 21/28  bone]
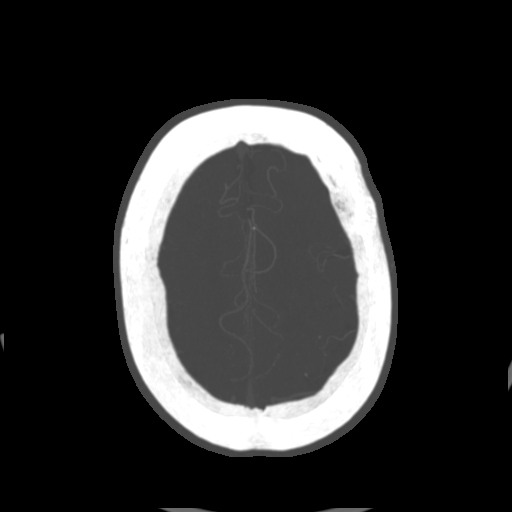
[im 23/28  soft-tissue]
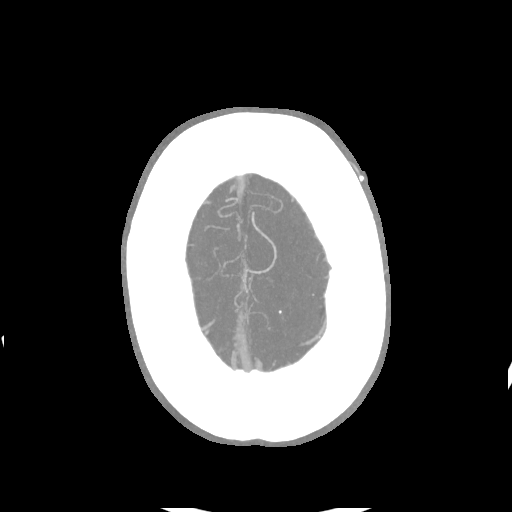
[im 25/28  bone]
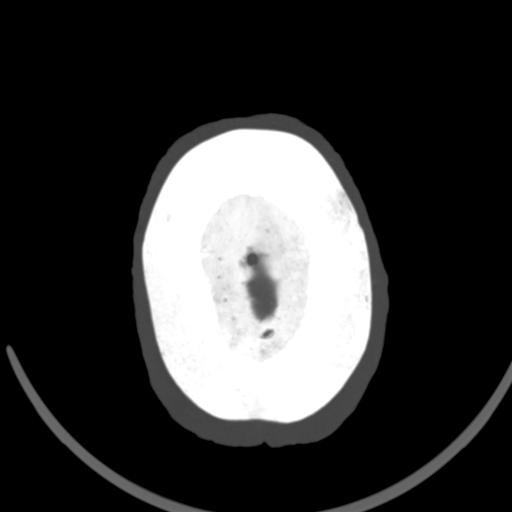

[13 of 33 positions shown; findings below may reference images not displayed]

FINDINGS: Moderate to large left posterior fossa infarct involving
a majority of the left cerebellum sparing the superior aspect.
Presently no associated hemorrhage however, there is associated
mass effect and the patient may be at risk for development of
hydrocephalus if there is further progression of edema.

Remote infarcts frontal lobes, basal ganglia and left thalamus.
Small vessel disease type changes.  Global atrophy.  Ventricular
prominence probably related to atrophy but will need to be
monitored.

No intracranial hemorrhage.

No intracranial enhancing lesion.

The left vertebral artery is the dominant vertebral artery.  Mild
narrowing of the left vertebral artery with ectasia.  Right
vertebral artery is small after the takeoff of the right PICA.

Basilar artery is ectatic, slightly small and irregular although
not occluded or without focal high-grade stenosis.

Portions of the PICAs are noted bilaterally.

Portions of the] AICA visualized bilaterally. Left AICA only noted
proximally with poor delineation of the distal branches.

Atherosclerotic type changes with mild to moderate narrowing
cavernous segment of the internal carotid artery bilaterally.
Fetal type origin posterior cerebral artery bilaterally.

No high-grade stenosis of the M1 segment or A1 segment of the
middle cerebral artery or anterior cerebral artery on either side.

Middle cerebral artery branch vessel irregularity bilaterally.

No aneurysm or vascular malformation noted.

 Review of the MIP images confirms the above findings.
IMPRESSION: Moderate to large left cerebellar infarct with local mass effect.
Presently no associated hemorrhage or hydrocephalus currently.

Although there is narrowing of portions of the vertebral arteries
and basilar artery, no high-grade stenosis or thrombus identified.

Portions of the PICAs are noted bilaterally.

Portions of the AICA visualized bilaterally. Left AICA only noted
proximally with poor delineation of the distal branches.

Mild to moderate narrowing of the cavernous segment of the internal
carotid artery bilaterally.

## 2015-02-11 NOTE — Progress Notes (Signed)
PATIENT: Jason Scotted Deitrick DOB: 05-Jul-1930   REASON FOR VISIT: follow up for stroke HISTORY FROM: patient  HISTORY OF PRESENT ILLNESS: Jason Walker is a 81 year old male with known A. fib but not on anticoagulation due to previous life-threatening GI bleed, PVT status post ICD, CKD. Not a Coumadin candidate secondary to history of previous GI bleeding in October 2013 requiring 7 unit transfusions, at high risk for rebleeding per gastroenterologist. Patient was found on 02/09/2013 by wife in the morning unresponsive and he had been vomiting. He was [facility] but [facility] due to lack of neurology coverage at [facility]. 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 Infarcts felt to be embolic secondary to known atrial fibrillation. Patient with resultant lethargy, nausea, vomiting, headache, dysarthria, visual abnormalities, dizziness, dysphasia, and eye movement abnormalities. Patient was discharged to inpatient rehabilitation on aspirin 81 mg and Plavix 75 mg orally daily for secondary stroke prevention.  Patient is seen in office today much improved. He continues on aspirin 81 mg and Plavix 75 mg daily. Patient denies medication side effects, with no signs of bleeding. Patient has completed inpatient therapy. He is independent with his dressing and bathing. He uses a cane to ambulate when away from home. Denies pain, paresthesias, recent falls and headaches. He states his blood pressure is well controlled but his sugars have ranged from 90s to 130s fasting .   UPDATE 07/21/13 (LL): Patient comes to office for follow up since last 3 month visit. Patient states he is the same, wife states his hearing is worse and he gets short of breath with any activity. Had recent PFT's tested at [facility], results pending. He continues on aspirin 81 mg and Plavix 75 mg  daily. Patient denies medication side effects, with no signs of bleeding, mild bruising on bilateral arms. Patient wears HomeAlert monitor around neck that sends alert to family if he has a fall. He reports no falls at home. Blood sugars doing well per report, averaging 120-140 in am.   UPDATE 01/21/14 (LL):  Patient returns to office for stroke follow up with his wife and 2 daughters.  He reports no new neurological or stroke like symptoms.  Since last visit he had PFT's which revealed moderately severe COPD. He wheezes frequently per wife, and has mild lower extremity edema. He tires easily with only walking. Update 05/06/2014 : He returns for followup of her last visit 4 months ago. Is a complaint by the wife and daughter reports that he is has had 2 episodes of possible TIAs in May. First episode the wife noticed that he wasn't sleep and and she woke up he was disoriented confused and had severe the carpal speech and could barely be understood. EMS was called but his CBG was 66 mg percent and he improved after he got orange juice and this was thought to be hypoglycemia and he did not seek help. 2 weeks  later he had an episode when he did not feel right he felt like lying down he broken this right here Jason Walker again this time he is taken to [facility] where he was admitted. He had a CT scan done there apparently showed new stroke but I do not have any hospital records or imaging studies to review today. Patient had been on aspirin and Plavix was added. 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 He undergone endoscopy and hadn't cauterization done by gastroenterologist but was considered high risk for anticoagulation and hence was only an aspirin. Patient complains of worsening paresthesias in his feet from a stab neuropathy. She is not been on in the neuropathic pain medications but he has pedal edema and does not want to take any medication  which can cause weight gain. States his sugars are yet not well controlled fasting glucose ranging in the 160 range. He has a pacemaker and hence cannot have an MRI scan. Update 07/02/2014 : He returns for follow up after last visit 2 months ago. He reports significant improvement in his lower extremity pain after starting gabapentin which he takes twice daily. He continues to have poor balance but he has had no falls. He states he feels like walking drunk  He has to be careful particularly while climbing steps or going down steps. His diabetes control continues to be fluctuating. He remains on aspirin and Plavix and has minor bruising. He had a brief episode of epistaxis several weeks ago. He had carotid ultrasound on 05/13/14 which showed mild 50-69% proximal and mid left ICA stenosis and bilateral proximal to this was unchanged. After Doppler studies showed diffuse increase and ulcer cavity in that his sister history of mild atherosclerosis. CT scan of the head showed no acute abnormality. Bilateral old basal ganglia and cerebellar infarcts were noted.   Update 02/11/2015 : He returns for follow-up after last visit 6 months ago. Is accompanied by his wife who states that his balance is poor and he is had a couple of falls but not had fortunately any major injury. He does have diabetic neuropathy and takes gabapentin which seems to help her paresthesias. He has been started on warfarin 3 months ago following TIAs and A. fib. He does have a remote history of GI bleed but so for has not had any major bleeding concerns until today when he is had some slight hematuria. He does have an appointment with his nephrologist in [place] tomorrow and have advised him to discuss this with him and he perhaps may have to stop the warfarin and. Patient has also become forgetful and gets confused easily. He wakes up in the morning with delusional dreams. He has also been feeling depressed. He said multiple recent hospitalizations in  December and January for congestive heart failure and fluid buildup. REVIEW OF SYSTEMS: Full 14 system review of systems performed and notable only for  hearing loss, activity change, trouble swallowing, wheezing, shortness of breath, double vision, leg swelling, blood in the urine, snoring, weakness, speech difficulty, dizziness, memory loss, easy bruising and bleeding.  ALLERGIES: Allergies  Allergen Reactions  . Lorazepam Other (See Comments)    "MAKES CRAZY"  . Morphine Nausea Only  . Morphine And Related Other (See Comments)    Makes crazy    HOME MEDICATIONS: Outpatient Prescriptions Prior to Visit  Medication Sig Dispense Refill  . carvedilol (COREG) 12.5 MG tablet Take 1 tablet (12.5 mg total) by mouth 2 (two) times daily. 180 tablet 3  . cholecalciferol (VITAMIN D) 1000 UNITS tablet Take 1,000 Units by mouth daily.    Marland Kitchen doxazosin (CARDURA) 4 MG tablet Take 4 mg by mouth at bedtime.      . furosemide (LASIX) 40 MG tablet Take 80 mg in the am and 40 mg in the pm 90 tablet 3  . insulin aspart (NOVOLOG) 100 UNIT/ML injection Inject  0-9 Units into the skin 3 (three) times daily with meals. 1 vial   . levothyroxine (SYNTHROID, LEVOTHROID) 50 MCG tablet Take 50 mcg by mouth daily before breakfast.     . metolazone (ZAROXOLYN) 5 MG tablet Take 5 mg by mouth daily.    . nitroGLYCERIN (NITROSTAT) 0.4 MG SL tablet Place 1 tablet (0.4 mg total) under the tongue every 5 (five) minutes as needed. (Patient taking differently: Place 0.4 mg under the tongue every 5 (five) minutes as needed for chest pain. ) 25 tablet 3  . omeprazole (PRILOSEC) 20 MG capsule Take 20 mg by mouth daily.     . potassium chloride (KLOR-CON) 8 MEQ tablet Take 8 mEq by mouth 2 (two) times daily.    Marland Kitchen warfarin (COUMADIN) 2.5 MG tablet Take 1 tablet (2.5 mg total) by mouth daily. Or as directed by coumadin clinic 90 tablet 3  . gabapentin (NEURONTIN) 300 MG capsule Take 300 mg by mouth 2 (two) times daily.    . insulin  glargine (LANTUS) 100 UNIT/ML injection Inject 0.3 mLs (30 Units total) into the skin at bedtime. 10 mL    No facility-administered medications prior to visit.   PHYSICAL EXAM  Filed Vitals:   02/11/15 1045  BP: 130/74  Pulse: 68  Height:  (1.651 m)  Weight: 195 lb 6.4 oz (88.633 kg)   Body mass index is 32.52 kg/(m^2).  GENERAL EXAM: Patient is in no distress, well developed and well groomed.  HEAD: Symmetric facial features.  EARS, NOSE, and THROAT: Normal. Hearing aide present NECK: Supple, no JVD  RESPIRATORY: Lungs with wheezing throughout CARDIOVASCULAR: regular rate and rhythm. Soft left Carotid bruit.  SKIN: No rash, mild arm bruising.  AV fistula right upper arm for future dialysis, 1+ LE edema  NEUROLOGIC:  MENTAL STATUS: awake, alert and oriented to person, place, Mini-Mental status exam scored 13/30 with deficits in orientation, attention, calculation, recall and following 3 step commands. Animal naming test 9 only. Geriatric depression scale 11 suggestive of moderate depression. Unable to do clock drawing.  CRANIAL NERVE: pupils equal and reactive to light, visual fields full to confrontation, extraocular muscles intact, no nystagmus, facial sensation and strength symmetric, uvula midline, shoulder shrug symmetric, tongue midline.  Decreased hearing bilaterally.  MOTOR: normal bulk and tone, full strength in the BUE, BLE, No arm drift  SENSORY: normal and symmetric to light touch, pinprick, temperature except diminished vibration over toes bilaterally COORDINATION: finger-nose-finger, fine finger movements normal. Orbits right or left approximately  REFLEXES: deep tendon reflexes present and symmetric 1+ except ankle jerks are depressed GAIT/STATION: narrow based gait; able to walk on toes, heels. DIFFICULTY WITH TANDEM WALK. rhomberg is negative. Has cane, but not using assistive device.   ASSESSMENT AND PLAN Jason Walker is a 81 y.o. year old male here for follow  up of large cerebellar embolic infarct on 02/09/2013 with cytotoxic cerebral edema with mild hydrocephalus, suspect left brainstem infarct not well seen on CT. Infarct felt to be embolic secondary to known atrial fibrillation.  2 episodes  In May 2015 of possible TIAs . diabetic neuropathic pain responsive to gabapentin. New onset of hematuria likely related to warfarin. Recent memory and cognitive decline likely due to mild dementia and treatable causes need to be ruled out PLAN:  I had a long d/w patient and wife about his remote stroke, risk for recurrent stroke/TIAs, personally independently reviewed imaging studies and stroke evaluation results and answered questions.Continue warfarin  for secondary stroke prevention but  I'm concerned about his hematuria and he likely needs to discontinue this but he has an appointment with his nephrologist tomorrow and let him decide . Check CT scan of the head without contrast to evaluate for any new strokes or subdurals as he is having frequent falls. Check carotid ultrasound to evaluate left carotid bruit. Maintain strict control of hypertension with blood pressure goal below 130/90, diabetes with hemoglobin A1c goal below 6.5% and lipids with LDL cholesterol goal below 100 mg/dL. check dementia panel labs and EEG for cognitive deterioration. May consider Aricept after about tests are completed.  Followup in the future with me in  2 months   Delia HeadyPramod Teegan Guinther, MD  02/11/2015, 11:53 AM [facility] [address], [address] [place], [place] [address] [phone]  Note: This document was prepared with digital dictation and possible smart phrase technology. Any transcriptional errors that result from this process are unintentional.

## 2015-02-11 NOTE — Patient Instructions (Addendum)
I had a long d/w patient and wife about his remote stroke, risk for recurrent stroke/TIAs, personally independently reviewed imaging studies and stroke evaluation results and answered questions.Continue warfarin  for secondary stroke prevention but I'm concerned about his hematuria and he likely needs to discontinue this but he has an appointment with his nephrologist tomorrow and let him decide . Check CT scan of the head without contrast to evaluate for any new strokes or subdurals as he is having frequent falls. Check carotid ultrasound to evaluate left carotid bruit. Maintain strict control of hypertension with blood pressure goal below 130/90, diabetes with hemoglobin A1c goal below 6.5% and lipids with LDL cholesterol goal below 100 mg/dL. check dementia panel labs for cognitive deterioration. May consider Aricept after about tests are completed.  Followup in the future with me in  2 months

## 2015-02-12 LAB — DEMENTIA PANEL
Homocysteine: 26.8 umol/L — ABNORMAL HIGH (ref 0.0–15.0)
RPR: NONREACTIVE
TSH: 0.902 u[IU]/mL (ref 0.450–4.500)
Vitamin B-12: 615 pg/mL (ref 211–946)

## 2015-02-14 IMAGING — CT CT HEAD W/O CM
1 series · 15 of 30 positions shown, 19 images · non-contrast
Comparison: 02/11/2013

CLINICAL DATA: Follow-up stroke.

CT HEAD WITHOUT CONTRAST
TECHNIQUE: Contiguous axial images were obtained from the base of
the skull through the vertex without contrast.

[Series 2: head routine 4.8 h37s · axial · 0.48mm/px · z∈[-142,+13]mm · 15 of 36 slices shown, 19 images]
[im 2/36  brain]
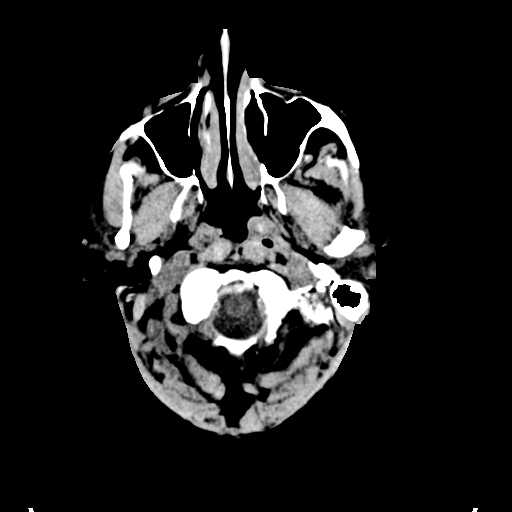
[im 2/36  bone]
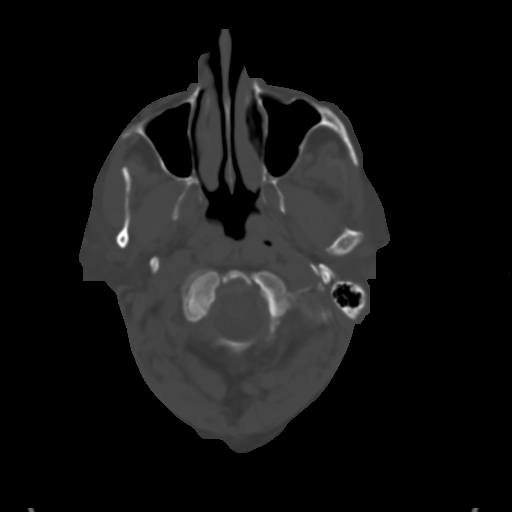
[im 4/36  brain]
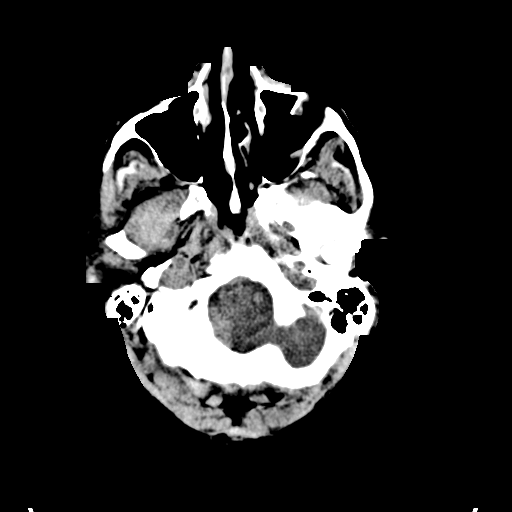
[im 7/36  brain]
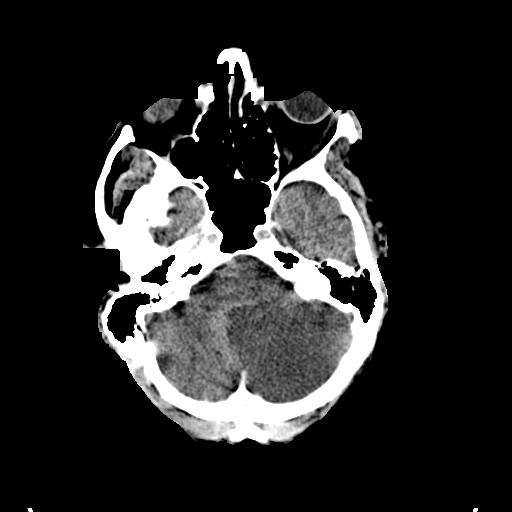
[im 9/36  brain]
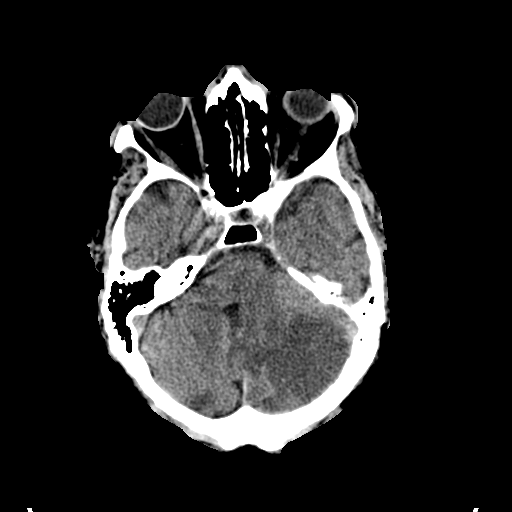
[im 11/36  brain]
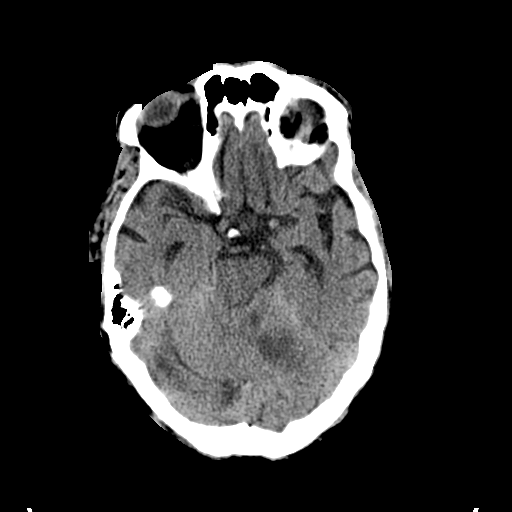
[im 11/36  bone]
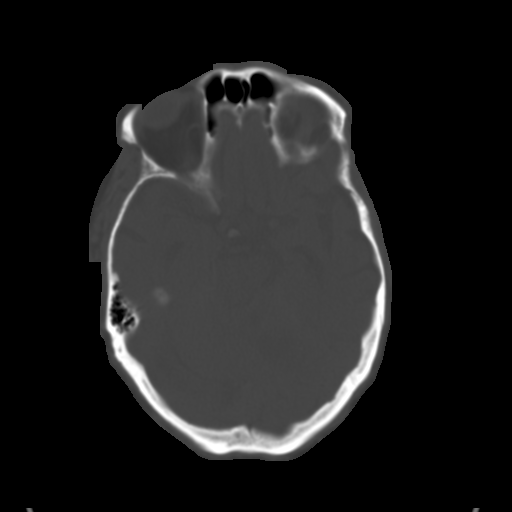
[im 14/36  brain]
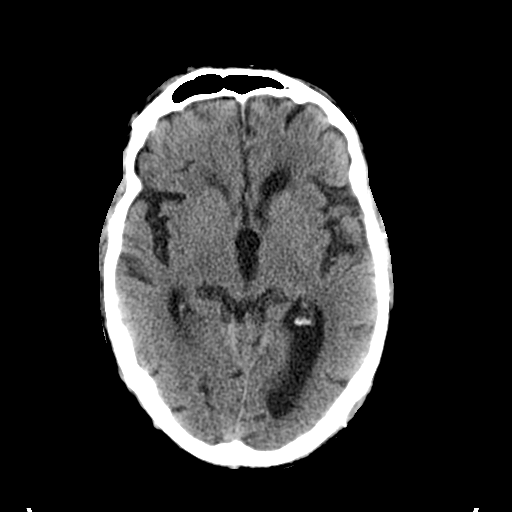
[im 16/36  brain]
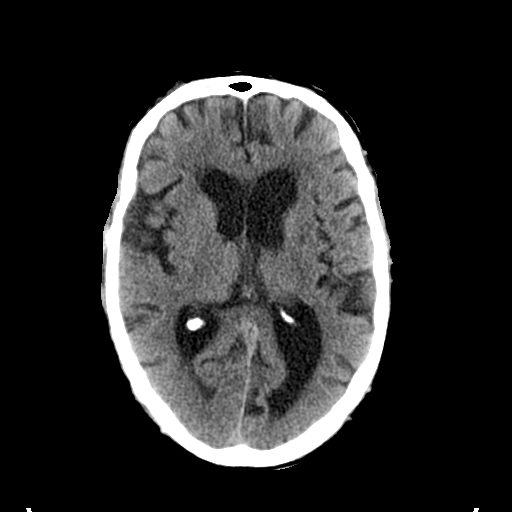
[im 19/36  brain]
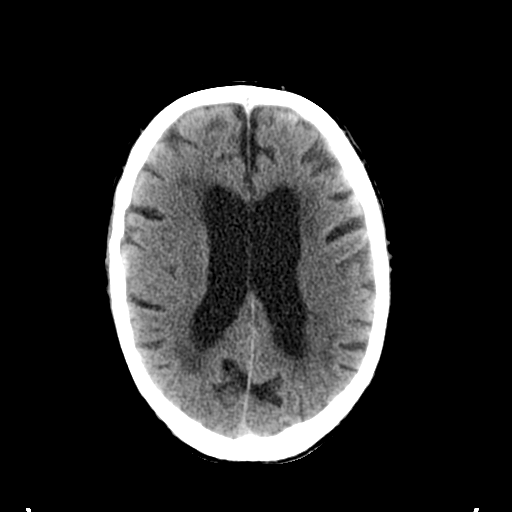
[im 20/36  brain]
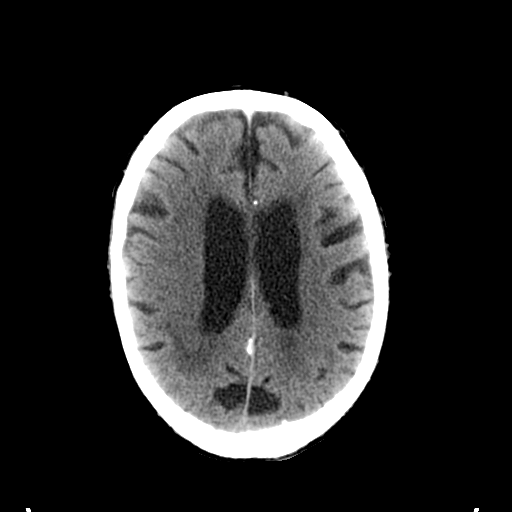
[im 20/36  bone]
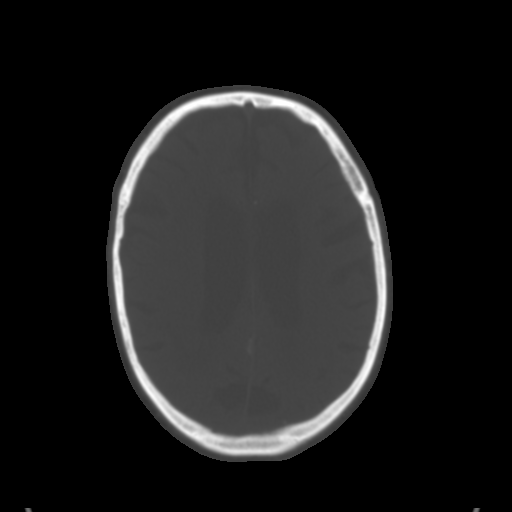
[im 22/36  brain]
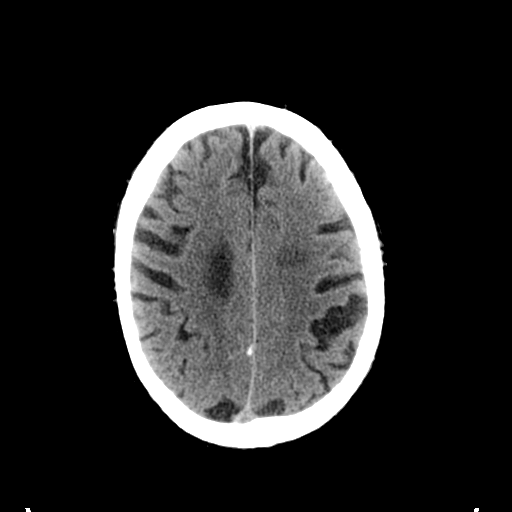
[im 25/36  brain]
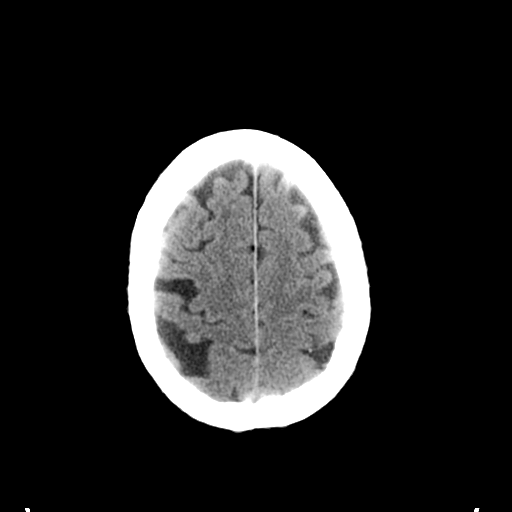
[im 27/36  brain]
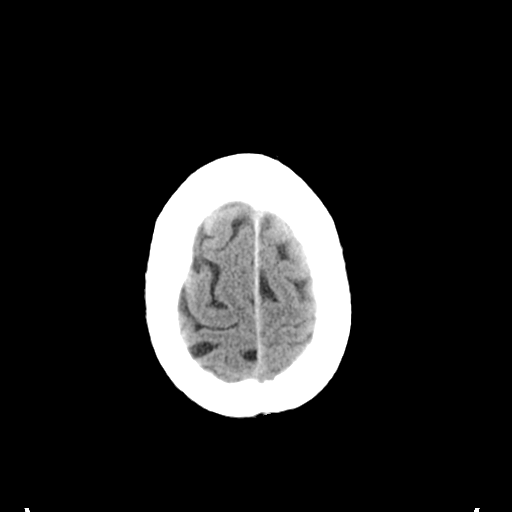
[im 29/36  brain]
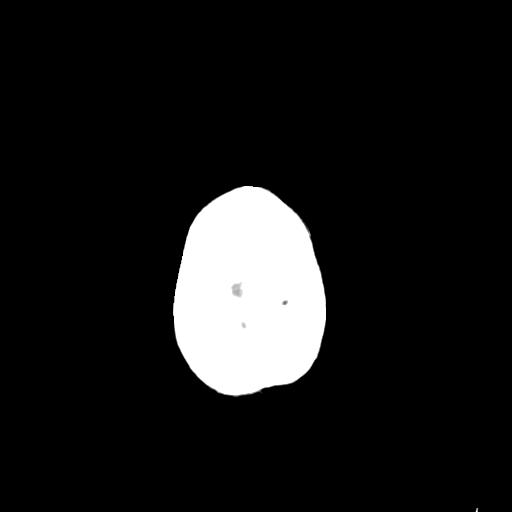
[im 29/36  bone]
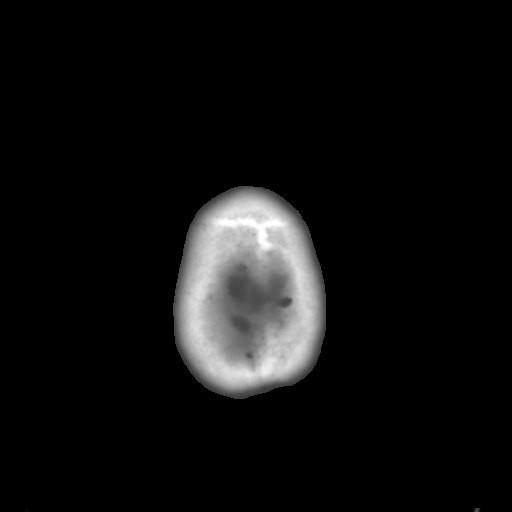
[im 32/36  brain]
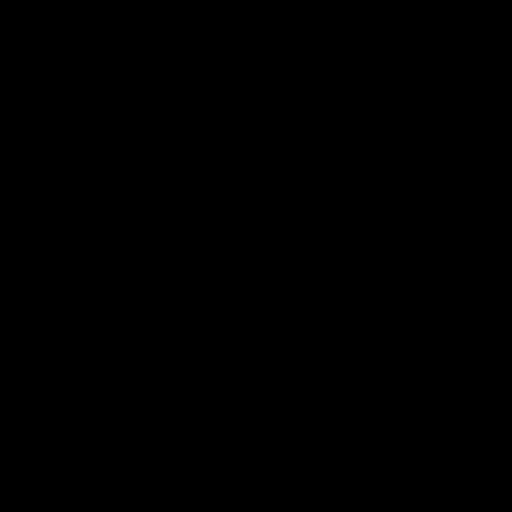
[im 34/36  brain]
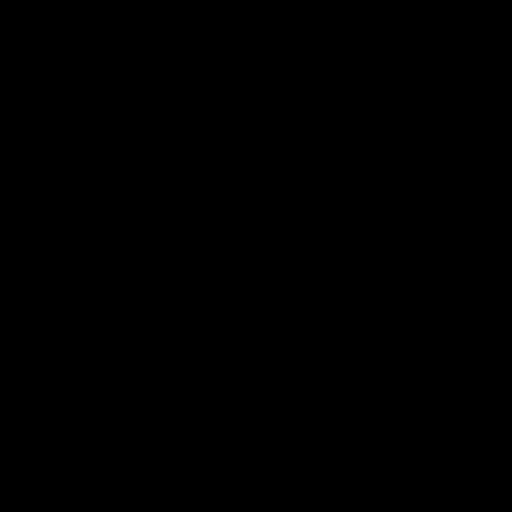

[15 of 30 positions shown; findings below may reference images not displayed]

FINDINGS: Moderately large low attenuation area consistent with
acute infarct involving the left cerebellum with associated
effacement of the fourth ventricle and posterior aspect of the mid
brain.  Low attenuation change in the mid brain consistent with
ischemia. No significant change since the previous study.  No new
infarct or mass effect.  No acute intracranial hemorrhage.

Chronic atrophy and ventricular dilatation.  Small vessel ischemic
changes in the deep white matter.  No abnormal extra-axial fluid
collections.  No depressed skull fractures.  Visualized paranasal
sinuses and mastoid air cells are not significantly opacified.
Small amount of mucus in the left maxillary antrum.  Vascular
calcifications.
IMPRESSION: Infarcts in the left cerebellum and left pons with effacement of
the fourth ventricle.  No significant interval change.  No acute
intracranial hemorrhage.

## 2015-02-15 ENCOUNTER — Ambulatory Visit (INDEPENDENT_AMBULATORY_CARE_PROVIDER_SITE_OTHER): Payer: Medicare Other | Admitting: *Deleted

## 2015-02-15 DIAGNOSIS — I4891 Unspecified atrial fibrillation: Secondary | ICD-10-CM | POA: Diagnosis not present

## 2015-02-15 DIAGNOSIS — I639 Cerebral infarction, unspecified: Secondary | ICD-10-CM | POA: Diagnosis not present

## 2015-02-15 LAB — POCT INR: INR: 2

## 2015-02-17 ENCOUNTER — Ambulatory Visit
Admission: RE | Admit: 2015-02-17 | Discharge: 2015-02-17 | Disposition: A | Discharge status: Home / Self Care | Payer: Medicare Other | Source: Ambulatory Visit | Attending: Neurology | Admitting: Neurology

## 2015-02-17 ENCOUNTER — Ambulatory Visit (INDEPENDENT_AMBULATORY_CARE_PROVIDER_SITE_OTHER): Payer: Medicare Other | Admitting: Neurology

## 2015-02-17 DIAGNOSIS — R296 Repeated falls: Secondary | ICD-10-CM | POA: Diagnosis not present

## 2015-02-17 DIAGNOSIS — R413 Other amnesia: Secondary | ICD-10-CM

## 2015-02-17 DIAGNOSIS — I69393 Ataxia following cerebral infarction: Secondary | ICD-10-CM

## 2015-02-17 DIAGNOSIS — R0989 Other specified symptoms and signs involving the circulatory and respiratory systems: Secondary | ICD-10-CM

## 2015-02-17 NOTE — Procedures (Signed)
   HISTORY: 81 years old male, with history of embolic stroke in 2014, presenting with memory loss, cognitive decline  TECHNIQUE:  16 channel EEG was performed based on standard 10-16 international system. One channel was dedicated to EKG, which has demonstrates irregular rhythm of 90 beats per minutes.  Upon awakening, the posterior background activity was well-developed, in alpha range, 9 Hz, with amplitude of 30 microvoltage, reactive to eye opening and closure.  There was no evidence of epileptiform discharge.  Photic stimulation was performed, which induced a symmetric photic driving.  Hyperventilation was not performed.  No sleep was achieved.  CONCLUSION: This is a  normal awake EEG.  There is no electrodiagnostic evidence of epileptiform discharge

## 2015-02-17 NOTE — Progress Notes (Signed)
PATIENT: Jason Walker DOB: 05-Jul-1930   REASON FOR VISIT: follow up for stroke HISTORY FROM: patient  HISTORY OF PRESENT ILLNESS: Jason Walker is a 81 year old male with known A. fib but not on anticoagulation due to previous life-threatening GI bleed, PVT status post ICD, CKD. Not a Coumadin candidate secondary to history of previous GI bleeding in October 2013 requiring 7 unit transfusions, at high risk for rebleeding per gastroenterologist. Patient was found on 02/09/2013 by wife in the morning unresponsive and he had been vomiting. He was taken to [facility] but [facility] due to lack of neurology coverage at [facility]. 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 Infarcts felt to be embolic secondary to known atrial fibrillation. Patient with resultant lethargy, nausea, vomiting, headache, dysarthria, visual abnormalities, dizziness, dysphasia, and eye movement abnormalities. Patient was discharged to inpatient rehabilitation on aspirin 81 mg and Plavix 75 mg orally daily for secondary stroke prevention.  Patient is seen in office today much improved. He continues on aspirin 81 mg and Plavix 75 mg daily. Patient denies medication side effects, with no signs of bleeding. Patient has completed inpatient therapy. He is independent with his dressing and bathing. He uses a cane to ambulate when away from home. Denies pain, paresthesias, recent falls and headaches. He states his blood pressure is well controlled but his sugars have ranged from 90s to 130s fasting .   UPDATE 07/21/13 (LL): Patient comes to office for follow up since last 3 month visit. Patient states he is the same, wife states his hearing is worse and he gets short of breath with any activity. Had recent PFT's tested at [facility], results pending. He continues on aspirin 81 mg and Plavix 75 mg  daily. Patient denies medication side effects, with no signs of bleeding, mild bruising on bilateral arms. Patient wears HomeAlert monitor around neck that sends alert to family if he has a fall. He reports no falls at home. Blood sugars doing well per report, averaging 120-140 in am.   UPDATE 01/21/14 (LL):  Patient returns to office for stroke follow up with his wife and 2 daughters.  He reports no new neurological or stroke like symptoms.  Since last visit he had PFT's which revealed moderately severe COPD. He wheezes frequently per wife, and has mild lower extremity edema. He tires easily with only walking. Update 05/06/2014 : He returns for followup of her last visit 4 months ago. Is a complaint by the wife and daughter reports that he is has had 2 episodes of possible TIAs in May. First episode the wife noticed that he wasn't sleep and and she woke up he was disoriented confused and had severe the carpal speech and could barely be understood. EMS was called but his CBG was 66 mg percent and he improved after he got orange juice and this was thought to be hypoglycemia and he did not seek help. 2 weeks  later he had an episode when he did not feel right he felt like lying down he broken this right here Jason. Jason Walker again this time he is taken to [facility] where he was admitted. He had a CT scan done there apparently showed new stroke but I do not have any hospital records or imaging studies to review today. Patient had been on aspirin and Plavix was added. 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 He undergone endoscopy and hadn't cauterization done by gastroenterologist but was considered high risk for anticoagulation and hence was only an aspirin. Patient complains of worsening paresthesias in his feet from a stab neuropathy. She is not been on in the neuropathic pain medications but he has pedal edema and does not want to take any medication  which can cause weight gain. States his sugars are yet not well controlled fasting glucose ranging in the 160 range. He has a pacemaker and hence cannot have an MRI scan. Update 07/02/2014 : He returns for follow up after last visit 2 months ago. He reports significant improvement in his lower extremity pain after starting gabapentin which he takes twice daily. He continues to have poor balance but he has had no falls. He states he feels like walking drunk  He has to be careful particularly while climbing steps or going down steps. His diabetes control continues to be fluctuating. He remains on aspirin and Plavix and has minor bruising. He had a brief episode of epistaxis several weeks ago. He had carotid ultrasound on 05/13/14 which showed mild 50-69% proximal and mid left ICA stenosis and bilateral proximal to this was unchanged. After Doppler studies showed diffuse increase and ulcer cavity in that his sister history of mild atherosclerosis. CT scan of the head showed no acute abnormality. Bilateral old basal ganglia and cerebellar infarcts were noted.   Update 02/11/2015 : He returns for follow-up after last visit 6 months ago. Is accompanied by his wife who states that his balance is poor and he is had a couple of falls but not had fortunately any major injury. He does have diabetic neuropathy and takes gabapentin which seems to help her paresthesias. He has been started on warfarin 3 months ago following TIAs and A. fib. He does have a remote history of GI bleed but so for has not had any major bleeding concerns until today when he is had some slight hematuria. He does have an appointment with his nephrologist in [place] tomorrow and have advised him to discuss this with him and he perhaps may have to stop the warfarin and. Patient has also become forgetful and gets confused easily. He wakes up in the morning with delusional dreams. He has also been feeling depressed. He said multiple recent hospitalizations in  December and January for congestive heart failure and fluid buildup. REVIEW OF SYSTEMS: Full 14 system review of systems performed and notable only for  hearing loss, activity change, trouble swallowing, wheezing, shortness of breath, double vision, leg swelling, blood in the urine, snoring, weakness, speech difficulty, dizziness, memory loss, easy bruising and bleeding.  ALLERGIES: Allergies  Allergen Reactions  . Lorazepam Other (See Comments)    "MAKES CRAZY"  . Morphine Nausea Only  . Morphine And Related Other (See Comments)    Makes crazy    HOME MEDICATIONS: Outpatient Prescriptions Prior to Visit  Medication Sig Dispense Refill  . carvedilol (COREG) 12.5 MG tablet Take 1 tablet (12.5 mg total) by mouth 2 (two) times daily. 180 tablet 3  . cholecalciferol (VITAMIN D) 1000 UNITS tablet Take 1,000 Units by mouth daily.    Marland Kitchen doxazosin (CARDURA) 4 MG tablet Take 4 mg by mouth at bedtime.      . furosemide (LASIX) 40 MG tablet Take 80 mg in the am and 40 mg in the pm 90 tablet 3  . gabapentin (NEURONTIN) 300 MG capsule TAKE ONE  CAPSULE BY MOUTH TWICE DAILY 60 capsule 3  . insulin aspart (NOVOLOG) 100 UNIT/ML injection Inject 0-9 Units into the skin 3 (three) times daily with meals. 1 vial   . insulin NPH Human (HUMULIN N,NOVOLIN N) 100 UNIT/ML injection Inject 100 Units into the skin. 50 units in the morning and 30 in the evening    . levothyroxine (SYNTHROID, LEVOTHROID) 50 MCG tablet Take 50 mcg by mouth daily before breakfast.     . metolazone (ZAROXOLYN) 5 MG tablet Take 5 mg by mouth daily.    . nitroGLYCERIN (NITROSTAT) 0.4 MG SL tablet Place 1 tablet (0.4 mg total) under the tongue every 5 (five) minutes as needed. (Patient taking differently: Place 0.4 mg under the tongue every 5 (five) minutes as needed for chest pain. ) 25 tablet 3  . omeprazole (PRILOSEC) 20 MG capsule Take 20 mg by mouth daily.     . potassium chloride (KLOR-CON) 8 MEQ tablet Take 8 mEq by mouth 2 (two) times  daily.    . potassium chloride SA (K-DUR,KLOR-CON) 20 MEQ tablet Take 20 mEq by mouth daily.    . traMADol (ULTRAM) 50 MG tablet Take 50 mg by mouth every 8 (eight) hours.    Marland Kitchen. warfarin (COUMADIN) 2.5 MG tablet Take 1 tablet (2.5 mg total) by mouth daily. Or as directed by coumadin clinic 90 tablet 3   No facility-administered medications prior to visit.   PHYSICAL EXAM  There were no vitals filed for this visit. There is no weight on file to calculate BMI.  GENERAL EXAM: Patient is in no distress, well developed and well groomed.  HEAD: Symmetric facial features.  EARS, NOSE, and THROAT: Normal. Hearing aide present NECK: Supple, no JVD  RESPIRATORY: Lungs with wheezing throughout CARDIOVASCULAR: regular rate and rhythm. Soft left Carotid bruit.  SKIN: No rash, mild arm bruising.  AV fistula right upper arm for future dialysis, 1+ LE edema  NEUROLOGIC:  MENTAL STATUS: awake, alert and oriented to person, place, Mini-Mental status exam scored 13/30 with deficits in orientation, attention, calculation, recall and following 3 step commands. Animal naming test 9 only. Geriatric depression scale 11 suggestive of moderate depression. Unable to do clock drawing.  CRANIAL NERVE: pupils equal and reactive to light, visual fields full to confrontation, extraocular muscles intact, no nystagmus, facial sensation and strength symmetric, uvula midline, shoulder shrug symmetric, tongue midline.  Decreased hearing bilaterally.  MOTOR: normal bulk and tone, full strength in the BUE, BLE, No arm drift  SENSORY: normal and symmetric to light touch, pinprick, temperature except diminished vibration over toes bilaterally COORDINATION: finger-nose-finger, fine finger movements normal. Orbits right or left approximately  REFLEXES: deep tendon reflexes present and symmetric 1+ except ankle jerks are depressed GAIT/STATION: narrow based gait; able to walk on toes, heels. DIFFICULTY WITH TANDEM WALK. rhomberg is  negative. Has cane, but not using assistive device.   ASSESSMENT AND PLAN Jason Walker is a 81 y.o. 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 Infarct felt to be embolic secondary to known atrial fibrillation.  2 episodes  In May 2015 of possible TIAs . diabetic neuropathic pain responsive to gabapentin. New onset of hematuria likely related to warfarin. Recent memory and cognitive decline likely due to mild dementia and treatable causes need to be ruled out PLAN:  I had a long d/w patient and wife about his remote  stroke, risk for recurrent stroke/TIAs, personally independently reviewed imaging studies and stroke evaluation results and answered questions.Continue warfarin  for secondary stroke prevention but I'm concerned about his hematuria and he likely needs to discontinue this but he has an appointment with his nephrologist tomorrow and let him decide . Check CT scan of the head without contrast to evaluate for any new strokes or subdurals as he is having frequent falls. Check carotid ultrasound to evaluate left carotid bruit. Maintain strict control of hypertension with blood pressure goal below 130/90, diabetes with hemoglobin A1c goal below 6.5% and lipids with LDL cholesterol goal below 100 mg/dL. check dementia panel labs and EEG for cognitive deterioration. May consider Aricept after about tests are completed.  Followup in the future with me in  2 months   Delia Heady, MD  02/17/2015, 3:15 PM [facility] [address], [address] [place], [place] [address] [phone]  Note: This document was prepared with digital dictation and possible smart phrase technology. Any transcriptional errors that result from this process are unintentional.

## 2015-03-03 ENCOUNTER — Ambulatory Visit (INDEPENDENT_AMBULATORY_CARE_PROVIDER_SITE_OTHER): Payer: Medicare Other | Admitting: *Deleted

## 2015-03-03 DIAGNOSIS — I4891 Unspecified atrial fibrillation: Secondary | ICD-10-CM | POA: Diagnosis not present

## 2015-03-03 DIAGNOSIS — I639 Cerebral infarction, unspecified: Secondary | ICD-10-CM | POA: Diagnosis not present

## 2015-03-03 DIAGNOSIS — I480 Paroxysmal atrial fibrillation: Secondary | ICD-10-CM | POA: Diagnosis not present

## 2015-03-03 LAB — POCT INR: INR: 2.3

## 2015-03-19 ENCOUNTER — Other Ambulatory Visit: Payer: Self-pay | Admitting: Internal Medicine

## 2015-03-19 MED ORDER — WARFARIN SODIUM 2.5 MG PO TABS: 2.5000 mg | ORAL_TABLET | Freq: Every day | ORAL | Status: DC

## 2015-03-19 NOTE — Telephone Encounter (Signed)
Received fax refill request  Rx # [id number] Medication:  Warfarin 2.5 mg tab Qty 90 Sig:  Take one tablet by mouth once daily or as directed by Coumadin Clinic Physician:  Ladona Ridgelaylor  Patient states directions changed to BID? If so, need new RX please.

## 2015-03-23 ENCOUNTER — Telehealth: Payer: Self-pay | Admitting: *Deleted

## 2015-03-23 MED ORDER — WARFARIN SODIUM 2.5 MG PO TABS: ORAL_TABLET | ORAL | Status: AC

## 2015-03-23 NOTE — Telephone Encounter (Signed)
Needs new RX for Coumadin sent to [facility] [place] / tg

## 2015-03-24 ENCOUNTER — Ambulatory Visit (INDEPENDENT_AMBULATORY_CARE_PROVIDER_SITE_OTHER): Payer: Medicare Other | Admitting: *Deleted

## 2015-03-24 DIAGNOSIS — I639 Cerebral infarction, unspecified: Secondary | ICD-10-CM

## 2015-03-24 DIAGNOSIS — I4891 Unspecified atrial fibrillation: Secondary | ICD-10-CM

## 2015-03-24 DIAGNOSIS — I480 Paroxysmal atrial fibrillation: Secondary | ICD-10-CM

## 2015-03-24 DIAGNOSIS — I63239 Cerebral infarction due to unspecified occlusion or stenosis of unspecified carotid arteries: Secondary | ICD-10-CM

## 2015-03-24 LAB — POCT INR: INR: 2.4

## 2015-03-31 ENCOUNTER — Telehealth: Payer: Self-pay | Admitting: Cardiology

## 2015-03-31 ENCOUNTER — Ambulatory Visit (INDEPENDENT_AMBULATORY_CARE_PROVIDER_SITE_OTHER): Payer: Medicare Other | Admitting: *Deleted

## 2015-03-31 DIAGNOSIS — I4729 Other ventricular tachycardia: Secondary | ICD-10-CM

## 2015-03-31 DIAGNOSIS — I472 Ventricular tachycardia: Secondary | ICD-10-CM

## 2015-03-31 DIAGNOSIS — I5022 Chronic systolic (congestive) heart failure: Secondary | ICD-10-CM

## 2015-03-31 NOTE — Telephone Encounter (Signed)
Confirmed remote transmission w/ pt wife.   

## 2015-04-01 ENCOUNTER — Telehealth: Payer: Self-pay | Admitting: Internal Medicine

## 2015-04-01 NOTE — Telephone Encounter (Signed)
New Message   Pt wanted to make sure remote transmission was sent successfully. Please call back and discuss.

## 2015-04-01 NOTE — Telephone Encounter (Signed)
Informed wife transmission was not received. Wife to try and send transmission again.

## 2015-04-05 DIAGNOSIS — I5022 Chronic systolic (congestive) heart failure: Secondary | ICD-10-CM | POA: Diagnosis not present

## 2015-04-05 DIAGNOSIS — I472 Ventricular tachycardia: Secondary | ICD-10-CM

## 2015-04-06 NOTE — Progress Notes (Signed)
Remote ICD transmission.   

## 2015-04-09 LAB — CUP PACEART REMOTE DEVICE CHECK
Battery Voltage: 2.93 V
Brady Statistic AP VP Percent: 0.31 %
Brady Statistic AP VS Percent: 1.07 %
Brady Statistic AS VS Percent: 72.06 %
Brady Statistic RA Percent Paced: 1.38 %
Brady Statistic RV Percent Paced: 26.87 %
Date Time Interrogation Session: 20160509140915
HighPow Impedance: 43 Ohm
HighPow Impedance: 52 Ohm
Lead Channel Impedance Value: 437 Ohm
Lead Channel Pacing Threshold Amplitude: 0.75 V
Lead Channel Pacing Threshold Pulse Width: 0.4 ms
Lead Channel Sensing Intrinsic Amplitude: 1.125 mV
Lead Channel Sensing Intrinsic Amplitude: 1.125 mV
Lead Channel Sensing Intrinsic Amplitude: 4.25 mV
Lead Channel Sensing Intrinsic Amplitude: 4.25 mV
Lead Channel Setting Pacing Pulse Width: 0.4 ms
MDC IDC MSMT LEADCHNL RA IMPEDANCE VALUE: 475 Ohm
MDC IDC MSMT LEADCHNL RA PACING THRESHOLD PULSEWIDTH: 0.4 ms
MDC IDC MSMT LEADCHNL RV PACING THRESHOLD AMPLITUDE: 0.5 V
MDC IDC SET LEADCHNL RA PACING AMPLITUDE: 2 V
MDC IDC SET LEADCHNL RV PACING AMPLITUDE: 2.5 V
MDC IDC SET LEADCHNL RV SENSING SENSITIVITY: 0.3 mV
MDC IDC SET ZONE DETECTION INTERVAL: 270 ms
MDC IDC SET ZONE DETECTION INTERVAL: 340 ms
MDC IDC SET ZONE DETECTION INTERVAL: 450 ms
MDC IDC STAT BRADY AS VP PERCENT: 26.56 %
Zone Setting Detection Interval: 350 ms

## 2015-04-16 ENCOUNTER — Encounter: Payer: Self-pay | Admitting: Cardiology

## 2015-04-20 ENCOUNTER — Encounter: Payer: Self-pay | Admitting: Internal Medicine

## 2015-04-21 ENCOUNTER — Ambulatory Visit (INDEPENDENT_AMBULATORY_CARE_PROVIDER_SITE_OTHER): Payer: Medicare Other | Admitting: *Deleted

## 2015-04-21 DIAGNOSIS — I4891 Unspecified atrial fibrillation: Secondary | ICD-10-CM

## 2015-04-21 DIAGNOSIS — I639 Cerebral infarction, unspecified: Secondary | ICD-10-CM

## 2015-04-21 LAB — POCT INR: INR: 3.6

## 2015-05-06 ENCOUNTER — Encounter: Payer: Self-pay | Admitting: Neurology

## 2015-05-06 ENCOUNTER — Ambulatory Visit (INDEPENDENT_AMBULATORY_CARE_PROVIDER_SITE_OTHER): Payer: Medicare Other | Admitting: Neurology

## 2015-05-06 VITALS — BP 130/81 | HR 82 | Wt 200.8 lb

## 2015-05-06 DIAGNOSIS — I6522 Occlusion and stenosis of left carotid artery: Secondary | ICD-10-CM

## 2015-05-06 NOTE — Progress Notes (Signed)
PATIENT: Jason Walker DOB: 05-Jul-1930   REASON FOR VISIT: follow up for stroke HISTORY FROM: patient  HISTORY OF PRESENT ILLNESS: [name] is a 81 year old male with known A. fib but not on anticoagulation due to previous life-threatening GI bleed, PVT status post ICD, CKD. Not a Coumadin candidate secondary to history of previous GI bleeding in October 2013 requiring 7 unit transfusions, at high risk for rebleeding per gastroenterologist. Patient was found on 02/09/2013 by wife in the morning unresponsive and he had been vomiting. He was [facility] but [facility] due to lack of neurology coverage at [facility]. 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 Infarcts felt to be embolic secondary to known atrial fibrillation. Patient with resultant lethargy, nausea, vomiting, headache, dysarthria, visual abnormalities, dizziness, dysphasia, and eye movement abnormalities. Patient was discharged to inpatient rehabilitation on aspirin 81 mg and Plavix 75 mg orally daily for secondary stroke prevention.  Patient is seen in office today much improved. He continues on aspirin 81 mg and Plavix 75 mg daily. Patient denies medication side effects, with no signs of bleeding. Patient has completed inpatient therapy. He is independent with his dressing and bathing. He uses a cane to ambulate when away from home. Denies pain, paresthesias, recent falls and headaches. He states his blood pressure is well controlled but his sugars have ranged from 90s to 130s fasting .   UPDATE 07/21/13 (LL): Patient comes to office for follow up since last 3 month visit. Patient states he is the same, wife states his hearing is worse and he gets short of breath with any activity. Had recent PFT's tested at [facility], results pending. He continues on aspirin 81 mg and Plavix 75 mg  daily. Patient denies medication side effects, with no signs of bleeding, mild bruising on bilateral arms. Patient wears HomeAlert monitor around neck that sends alert to family if he has a fall. He reports no falls at home. Blood sugars doing well per report, averaging 120-140 in am.   UPDATE 01/21/14 (LL):  Patient returns to office for stroke follow up with his wife and 2 daughters.  He reports no new neurological or stroke like symptoms.  Since last visit he had PFT's which revealed moderately severe COPD. He wheezes frequently per wife, and has mild lower extremity edema. He tires easily with only walking. Update 05/06/2014 : He returns for followup of her last visit 4 months ago. Is a complaint by the wife and daughter reports that he is has had 2 episodes of possible TIAs in May. First episode the wife noticed that he wasn't sleep and and she woke up he was disoriented confused and had severe the carpal speech and could barely be understood. EMS was called but his CBG was 66 mg percent and he improved after he got orange juice and this was thought to be hypoglycemia and he did not seek help. 2 weeks  later he had an episode when he did not feel right he felt like lying down he broken this right here [name] again this time he is taken to [facility] where he was admitted. He had a CT scan done there apparently showed new stroke but I do not have any hospital records or imaging studies to review today. Patient had been on aspirin and Plavix was added. 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 He undergone endoscopy and hadn't cauterization done by gastroenterologist but was considered high risk for anticoagulation and hence was only an aspirin. Patient complains of worsening paresthesias in his feet from a stab neuropathy. She is not been on in the neuropathic pain medications but he has pedal edema and does not want to take any medication  which can cause weight gain. States his sugars are yet not well controlled fasting glucose ranging in the 160 range. He has a pacemaker and hence cannot have an MRI scan. Update 07/02/2014 : He returns for follow up after last visit 2 months ago. He reports significant improvement in his lower extremity pain after starting gabapentin which he takes twice daily. He continues to have poor balance but he has had no falls. He states he feels like walking drunk  He has to be careful particularly while climbing steps or going down steps. His diabetes control continues to be fluctuating. He remains on aspirin and Plavix and has minor bruising. He had a brief episode of epistaxis several weeks ago. He had carotid ultrasound on 05/13/14 which showed mild 50-69% proximal and mid left ICA stenosis and bilateral proximal to this was unchanged. After Doppler studies showed diffuse increase and ulcer cavity in that his sister history of mild atherosclerosis. CT scan of the head showed no acute abnormality. Bilateral old basal ganglia and cerebellar infarcts were noted.   Update 02/11/2015 : He returns for follow-up after last visit 6 months ago. Is accompanied by his wife who states that his balance is poor and he is had a couple of falls but not had fortunately any major injury. He does have diabetic neuropathy and takes gabapentin which seems to help her paresthesias. He has been started on warfarin 3 months ago following TIAs and A. fib. He does have a remote history of GI bleed but so for has not had any major bleeding concerns until today when he is had some slight hematuria. He does have an appointment with his nephrologist in [place] tomorrow and have advised him to discuss this with him and he perhaps may have to stop the warfarin and. Patient has also become forgetful and gets confused easily. He wakes up in the morning with delusional dreams. He has also been feeling depressed. He said multiple recent hospitalizations in  December and January for congestive heart failure and fluid buildup. Update 05/06/15 : He returns for follow-up after last visit 3 months ago. Is accompanied by his wife. He continues to have poor balance especially when he first gets up but is fortunately not had any major falls. He has gained weight and his waist size has gone up. He was previously on Lasix but currently is off it. He does have an appointment coming with his kidney doctor to discuss restarting it. He underwent EEG on 02/17/15 which was normal. CT scan of the head also done the same day showed no acute abnormality and mild changes of chronic microvascular disease. Lab work on 02/11/15 showed normal TSH, RPR and vitamin B12 levels. Patient states his memory difficulties about the same. He is more bothered by tingling numbness in his hands now but he admits his sugars have been quite high. His primary physician and increase his insulin at last visit. Patient and wife are not sure they want to try Aricept. REVIEW OF SYSTEMS: Full 14 system review of systems performed and notable only for  hearing loss, activity  change, trouble swallowing, wheezing, shortness of breath, double vision, leg swelling, blood in the urine, snoring, weakness, speech difficulty, dizziness, memory loss, easy bruising and bleeding.  ALLERGIES: Allergies  Allergen Reactions  . Lorazepam Other (See Comments)    "MAKES CRAZY"  . Morphine Nausea Only  . Morphine And Related Other (See Comments)    Makes crazy    HOME MEDICATIONS: Outpatient Prescriptions Prior to Visit  Medication Sig Dispense Refill  . carvedilol (COREG) 12.5 MG tablet Take 1 tablet (12.5 mg total) by mouth 2 (two) times daily. 180 tablet 3  . cholecalciferol (VITAMIN D) 1000 UNITS tablet Take 1,000 Units by mouth daily.    Marland Kitchen doxazosin (CARDURA) 4 MG tablet Take 4 mg by mouth at bedtime.      . furosemide (LASIX) 40 MG tablet Take 80 mg in the am and 40 mg in the pm 90 tablet 3  . insulin aspart  (NOVOLOG) 100 UNIT/ML injection Inject 0-9 Units into the skin 3 (three) times daily with meals. 1 vial   . insulin NPH Human (HUMULIN N,NOVOLIN N) 100 UNIT/ML injection Inject 100 Units into the skin. 50 units in the morning and 30 in the evening    . levothyroxine (SYNTHROID, LEVOTHROID) 50 MCG tablet Take 50 mcg by mouth daily before breakfast.     . metolazone (ZAROXOLYN) 5 MG tablet Take 5 mg by mouth daily.    . nitroGLYCERIN (NITROSTAT) 0.4 MG SL tablet Place 1 tablet (0.4 mg total) under the tongue every 5 (five) minutes as needed. (Patient taking differently: Place 0.4 mg under the tongue every 5 (five) minutes as needed for chest pain. ) 25 tablet 3  . omeprazole (PRILOSEC) 20 MG capsule Take 20 mg by mouth daily.     . potassium chloride SA (K-DUR,KLOR-CON) 20 MEQ tablet Take 20 mEq by mouth daily.    . traMADol (ULTRAM) 50 MG tablet Take 50 mg by mouth every 8 (eight) hours.    Marland Kitchen warfarin (COUMADIN) 2.5 MG tablet Take 2 tablets daily except 1 1/2 tablets on Sundays and Thursdays or as directed by coumadin clinic 180 tablet 3  . gabapentin (NEURONTIN) 300 MG capsule TAKE ONE CAPSULE BY MOUTH TWICE DAILY 60 capsule 3  . potassium chloride (KLOR-CON) 8 MEQ tablet Take 8 mEq by mouth 2 (two) times daily.     No facility-administered medications prior to visit.   PHYSICAL EXAM  Filed Vitals:   05/06/15 1105  BP: 130/81  Pulse: 82  Weight: 200 lb 12.8 oz (91.082 kg)   Body mass index is 33.41 kg/(m^2).  GENERAL EXAM: Patient is in no distress, well developed and well groomed.  HEAD: Symmetric facial features.  EARS, NOSE, and THROAT: Normal. Hearing aide present NECK: Supple, no JVD  RESPIRATORY: Lungs with wheezing throughout CARDIOVASCULAR: regular rate and rhythm. Soft left Carotid bruit.  SKIN: No rash, mild arm bruising.  AV fistula right upper arm for future dialysis, 1+ LE edema  NEUROLOGIC:  MENTAL STATUS: awake, alert and oriented to person, place, Mini-Mental status  exam scored 15/30 with deficits in orientation, attention, calculation, recall and following 3 step commands. Animal naming test 8 only. Geriatric depression scale 7 suggestive of mild depression.   clock drawing. 1/4. CRANIAL NERVE: pupils equal and reactive to light, visual fields full to confrontation, extraocular muscles intact, no nystagmus, facial sensation and strength symmetric, uvula midline, shoulder shrug symmetric, tongue midline.  Decreased hearing bilaterally.  MOTOR: normal bulk and tone, full strength in the BUE,  BLE, No arm drift  SENSORY: normal and symmetric to light touch, pinprick, temperature except diminished vibration over toes bilaterally COORDINATION: finger-nose-finger, fine finger movements normal. Orbits right or left approximately  REFLEXES: deep tendon reflexes present and symmetric 1+ except ankle jerks are depressed GAIT/STATION: narrow based gait; able to walk on toes, heels. Difficulty with tandem walking. rhomberg is negative.   ASSESSMENT AND PLAN Hesston Hitchens is a 81 y.o. 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 Infarct felt to be embolic secondary to known atrial fibrillation.  2 episodes  In May 2015 of possible TIAs . diabetic neuropathic pain responsive to gabapentin. New onset of hematuria likely related to warfarin. Recent memory and cognitive decline likely due to mild dementia and treatable causes have been ruled out PLAN:  I had a long d/w patient and wife about his remote TIA, risk for recurrent stroke/TIAs, personally independently reviewed imaging studies and stroke evaluation results and answered questions.Continue warfarin  for secondary stroke prevention for atrial fibrillation and maintain strict control of hypertension with blood pressure goal below 130/90, diabetes with hemoglobin A1c goal below 6.5% and  lipids with LDL cholesterol goal below 70 mg/dL. I also advised the patient to eat a healthy diet with plenty of whole grains, cereals, fruits and vegetables, exercise regularly and maintain ideal body weight he was advised fall prevention precautions. We will consider Aricept in case of further worsening of cognition and memory I also advised him to follow-up with his nephrologist to discuss Lasix usage and weight loss.Followup in the future with me in 6 months Delia Heady, MD  05/06/2015, 11:45 AM [facility] [address], [address] [place], [place] [address] [phone]  Note: This document was prepared with digital dictation and possible smart phrase technology. Any transcriptional errors that result from this process are unintentional.

## 2015-05-06 NOTE — Patient Instructions (Signed)
I had a long d/w patient and wife about his remote TIA, risk for recurrent stroke/TIAs, personally independently reviewed imaging studies and stroke evaluation results and answered questions.Continue warfarin  for secondary stroke prevention for atrial fibrillation and maintain strict control of hypertension with blood pressure goal below 130/90, diabetes with hemoglobin A1c goal below 6.5% and lipids with LDL cholesterol goal below 70 mg/dL. I also advised the patient to eat a healthy diet with plenty of whole grains, cereals, fruits and vegetables, exercise regularly and maintain ideal body weight he was advised fall prevention precautions. I also advised him to follow-up with his nephrologist to discuss Lasix usage and weight loss.Followup in the future with me in 6 months Fall Prevention and Home Safety Falls cause injuries and can affect all age groups. It is possible to use preventive measures to significantly decrease the likelihood of falls. There are many simple measures which can make your home safer and prevent falls. OUTDOORS  Repair cracks and edges of walkways and driveways.  Remove high doorway thresholds.  Trim shrubbery on the main path into your home.  Have good outside lighting.  Clear walkways of tools, rocks, debris, and clutter.  Check that handrails are not broken and are securely fastened. Both sides of steps should have handrails.  Have leaves, snow, and ice cleared regularly.  Use sand or salt on walkways during winter months.  In the garage, clean up grease or oil spills. BATHROOM  Install night lights.  Install grab bars by the toilet and in the tub and shower.  Use non-skid mats or decals in the tub or shower.  Place a plastic non-slip stool in the shower to sit on, if needed.  Keep floors dry and clean up all water on the floor immediately.  Remove soap buildup in the tub or shower on a regular basis.  Secure bath mats with non-slip, double-sided rug  tape.  Remove throw rugs and tripping hazards from the floors. BEDROOMS  Install night lights.  Make sure a bedside light is easy to reach.  Do not use oversized bedding.  Keep a telephone by your bedside.  Have a firm chair with side arms to use for getting dressed.  Remove throw rugs and tripping hazards from the floor. KITCHEN  Keep handles on pots and pans turned toward the center of the stove. Use back burners when possible.  Clean up spills quickly and allow time for drying.  Avoid walking on wet floors.  Avoid hot utensils and knives.  Position shelves so they are not too high or low.  Place commonly used objects within easy reach.  If necessary, use a sturdy step stool with a grab bar when reaching.  Keep electrical cables out of the way.  Do not use floor polish or wax that makes floors slippery. If you must use wax, use non-skid floor wax.  Remove throw rugs and tripping hazards from the floor. STAIRWAYS  Never leave objects on stairs.  Place handrails on both sides of stairways and use them. Fix any loose handrails. Make sure handrails on both sides of the stairways are as long as the stairs.  Check carpeting to make sure it is firmly attached along stairs. Make repairs to worn or loose carpet promptly.  Avoid placing throw rugs at the top or bottom of stairways, or properly secure the rug with carpet tape to prevent slippage. Get rid of throw rugs, if possible.  Have an electrician put in a light switch at the  top and bottom of the stairs. OTHER FALL PREVENTION TIPS  Wear low-heel or rubber-soled shoes that are supportive and fit well. Wear closed toe shoes.  When using a stepladder, make sure it is fully opened and both spreaders are firmly locked. Do not climb a closed stepladder.  Add color or contrast paint or tape to grab bars and handrails in your home. Place contrasting color strips on first and last steps.  Learn and use mobility aids as  needed. Install an electrical emergency response system.  Turn on lights to avoid dark areas. Replace light bulbs that burn out immediately. Get light switches that glow.  Arrange furniture to create clear pathways. Keep furniture in the same place.  Firmly attach carpet with non-skid or double-sided tape.  Eliminate uneven floor surfaces.  Select a carpet pattern that does not visually hide the edge of steps.  Be aware of all pets. OTHER HOME SAFETY TIPS  Set the water temperature for 120 F (48.8 C).  Keep emergency numbers on or near the telephone.  Keep smoke detectors on every level of the home and near sleeping areas. Document Released: 11/03/2002 Document Revised: 05/14/2012 Document Reviewed: 02/02/2012 [facility] Patient Information 2015 [place], [place]. This information is not intended to replace advice given to you by your health care provider. Make sure you discuss any questions you have with your health care provider.

## 2015-05-12 ENCOUNTER — Telehealth: Payer: Self-pay

## 2015-05-12 NOTE — Telephone Encounter (Signed)
Called Patient, spoke with his wife to  scheduled US Carotid. Wife verbalizes understanding.

## 2015-05-17 ENCOUNTER — Ambulatory Visit (INDEPENDENT_AMBULATORY_CARE_PROVIDER_SITE_OTHER): Payer: Medicare Other | Admitting: *Deleted

## 2015-05-17 DIAGNOSIS — I4891 Unspecified atrial fibrillation: Secondary | ICD-10-CM | POA: Diagnosis not present

## 2015-05-17 DIAGNOSIS — I639 Cerebral infarction, unspecified: Secondary | ICD-10-CM | POA: Diagnosis not present

## 2015-05-17 LAB — POCT INR: INR: 2.2

## 2015-06-02 ENCOUNTER — Other Ambulatory Visit: Payer: Self-pay

## 2015-06-14 ENCOUNTER — Telehealth: Payer: Self-pay | Admitting: *Deleted

## 2015-06-14 ENCOUNTER — Ambulatory Visit: Payer: Medicare Other | Admitting: *Deleted

## 2015-06-14 NOTE — Telephone Encounter (Signed)
Per patient's daughter, pt is [facility] and being reported to "Dr.Miller". When asked who Dr.Miller was she stated that it was his Cardiologist. Also stated that as of next week he would have home monitor and would be checking it at home. / tg

## 2015-06-14 NOTE — Telephone Encounter (Signed)
Documented and pt removed from coumadin clinic

## 2015-06-19 ENCOUNTER — Other Ambulatory Visit: Payer: Self-pay | Admitting: Neurology

## 2015-06-29 ENCOUNTER — Encounter: Payer: Medicare Other | Admitting: Internal Medicine

## 2015-07-07 ENCOUNTER — Telehealth: Payer: Self-pay | Admitting: Cardiology

## 2015-07-07 NOTE — Telephone Encounter (Signed)
Noted in paceart.  

## 2015-07-07 NOTE — Telephone Encounter (Signed)
-----   Message from Louanna Raw, RN sent at 07/07/2015 12:26 PM EDT ----- Wife called.  Pt has been turned over to hospice.  Is DNR.  Defibrillator has been turned off by Dr Hyacinth Meeker in [place].  She wanted me to let you know. Misty Stanley

## 2015-07-29 ENCOUNTER — Encounter: Payer: Medicare Other | Admitting: Internal Medicine

## 2015-07-29 DEATH — deceased

## 2015-11-17 ENCOUNTER — Ambulatory Visit: Payer: Medicare Other | Admitting: Neurology
